# Patient Record
Sex: Female | Born: 1965 | Race: White | Hispanic: No | Smoking: Never smoker
Health system: Southern US, Community
[De-identification: ages and names within clinical notes are randomized; demographics above are authoritative.]

## PROBLEM LIST (undated history)

## (undated) DIAGNOSIS — I10 Essential (primary) hypertension: Secondary | ICD-10-CM

## (undated) DIAGNOSIS — T753XXA Motion sickness, initial encounter: Secondary | ICD-10-CM

## (undated) DIAGNOSIS — F419 Anxiety disorder, unspecified: Secondary | ICD-10-CM

## (undated) DIAGNOSIS — J302 Other seasonal allergic rhinitis: Secondary | ICD-10-CM

## (undated) DIAGNOSIS — G4730 Sleep apnea, unspecified: Secondary | ICD-10-CM

## (undated) DIAGNOSIS — M1990 Unspecified osteoarthritis, unspecified site: Secondary | ICD-10-CM

## (undated) DIAGNOSIS — K219 Gastro-esophageal reflux disease without esophagitis: Secondary | ICD-10-CM

## (undated) HISTORY — PX: GUM SURGERY: SHX658

## (undated) HISTORY — DX: Sleep apnea, unspecified: G47.30

---
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
## 2007-06-13 ENCOUNTER — Ambulatory Visit: Payer: Self-pay | Admitting: Gynecologic Oncology

## 2007-07-11 ENCOUNTER — Ambulatory Visit: Payer: Self-pay | Admitting: Gynecologic Oncology

## 2007-08-24 HISTORY — PX: TUBAL LIGATION: SHX77

## 2007-10-22 ENCOUNTER — Ambulatory Visit: Payer: Self-pay | Admitting: Gynecologic Oncology

## 2007-10-31 ENCOUNTER — Ambulatory Visit: Payer: Self-pay | Admitting: Gynecologic Oncology

## 2008-01-01 ENCOUNTER — Ambulatory Visit: Payer: Self-pay | Admitting: Unknown Physician Specialty

## 2008-01-09 ENCOUNTER — Ambulatory Visit: Payer: Self-pay | Admitting: Unknown Physician Specialty

## 2008-01-09 HISTORY — PX: OTHER SURGICAL HISTORY: SHX169

## 2013-09-19 LAB — HM PAP SMEAR: HM PAP: NEGATIVE

## 2013-09-28 LAB — HM MAMMOGRAPHY

## 2014-10-01 LAB — CBC AND DIFFERENTIAL
HCT: 40 % (ref 36–46)
HEMOGLOBIN: 13.6 g/dL (ref 12.0–16.0)
Platelets: 289 10*3/uL (ref 150–399)
WBC: 6 10*3/mL

## 2014-10-01 LAB — HEPATIC FUNCTION PANEL
ALT: 13 U/L (ref 7–35)
AST: 18 U/L (ref 13–35)

## 2014-10-01 LAB — LIPID PANEL
CHOLESTEROL: 196 mg/dL (ref 0–200)
HDL: 60 mg/dL (ref 35–70)
LDL CALC: 111 mg/dL
Triglycerides: 126 mg/dL (ref 40–160)

## 2014-10-01 LAB — BASIC METABOLIC PANEL
BUN: 19 mg/dL (ref 4–21)
Creatinine: 0.8 mg/dL (ref 0.5–1.1)
Glucose: 88 mg/dL
Potassium: 4.1 mmol/L (ref 3.4–5.3)
SODIUM: 138 mmol/L (ref 137–147)

## 2014-10-01 LAB — TSH: TSH: 1.65 u[IU]/mL (ref 0.41–5.90)

## 2014-12-26 DIAGNOSIS — F32A Depression, unspecified: Secondary | ICD-10-CM | POA: Insufficient documentation

## 2014-12-26 DIAGNOSIS — I10 Essential (primary) hypertension: Secondary | ICD-10-CM | POA: Insufficient documentation

## 2014-12-26 DIAGNOSIS — F329 Major depressive disorder, single episode, unspecified: Secondary | ICD-10-CM | POA: Insufficient documentation

## 2014-12-26 DIAGNOSIS — F4329 Adjustment disorder with other symptoms: Secondary | ICD-10-CM | POA: Insufficient documentation

## 2014-12-26 DIAGNOSIS — K219 Gastro-esophageal reflux disease without esophagitis: Secondary | ICD-10-CM | POA: Insufficient documentation

## 2014-12-26 DIAGNOSIS — F439 Reaction to severe stress, unspecified: Secondary | ICD-10-CM | POA: Insufficient documentation

## 2014-12-26 DIAGNOSIS — J309 Allergic rhinitis, unspecified: Secondary | ICD-10-CM | POA: Insufficient documentation

## 2014-12-26 DIAGNOSIS — R030 Elevated blood-pressure reading, without diagnosis of hypertension: Secondary | ICD-10-CM

## 2014-12-26 DIAGNOSIS — F419 Anxiety disorder, unspecified: Secondary | ICD-10-CM | POA: Insufficient documentation

## 2014-12-26 DIAGNOSIS — E559 Vitamin D deficiency, unspecified: Secondary | ICD-10-CM | POA: Insufficient documentation

## 2014-12-26 DIAGNOSIS — F4320 Adjustment disorder, unspecified: Secondary | ICD-10-CM | POA: Insufficient documentation

## 2014-12-26 DIAGNOSIS — IMO0001 Reserved for inherently not codable concepts without codable children: Secondary | ICD-10-CM | POA: Insufficient documentation

## 2015-01-22 LAB — HM COLONOSCOPY

## 2015-02-17 ENCOUNTER — Encounter: Payer: Self-pay | Admitting: *Deleted

## 2015-02-20 NOTE — Discharge Instructions (Signed)

## 2015-02-21 ENCOUNTER — Ambulatory Visit
Admission: RE | Admit: 2015-02-21 | Discharge: 2015-02-21 | Disposition: A | Discharge status: Home / Self Care | Payer: BC Managed Care – PPO | Source: Ambulatory Visit | Attending: Gastroenterology | Admitting: Gastroenterology

## 2015-02-21 ENCOUNTER — Ambulatory Visit: Payer: BC Managed Care – PPO | Admitting: Anesthesiology

## 2015-02-21 ENCOUNTER — Encounter
Admission: RE | Disposition: A | Discharge status: Home / Self Care | Payer: Self-pay | Source: Ambulatory Visit | Attending: Gastroenterology

## 2015-02-21 DIAGNOSIS — K640 First degree hemorrhoids: Secondary | ICD-10-CM | POA: Diagnosis not present

## 2015-02-21 DIAGNOSIS — Z79899 Other long term (current) drug therapy: Secondary | ICD-10-CM | POA: Diagnosis not present

## 2015-02-21 DIAGNOSIS — J302 Other seasonal allergic rhinitis: Secondary | ICD-10-CM | POA: Diagnosis not present

## 2015-02-21 DIAGNOSIS — Z1211 Encounter for screening for malignant neoplasm of colon: Secondary | ICD-10-CM | POA: Insufficient documentation

## 2015-02-21 DIAGNOSIS — K5730 Diverticulosis of large intestine without perforation or abscess without bleeding: Secondary | ICD-10-CM | POA: Insufficient documentation

## 2015-02-21 DIAGNOSIS — Z7951 Long term (current) use of inhaled steroids: Secondary | ICD-10-CM | POA: Insufficient documentation

## 2015-02-21 DIAGNOSIS — Z9889 Other specified postprocedural states: Secondary | ICD-10-CM | POA: Diagnosis not present

## 2015-02-21 DIAGNOSIS — M1990 Unspecified osteoarthritis, unspecified site: Secondary | ICD-10-CM | POA: Diagnosis not present

## 2015-02-21 DIAGNOSIS — Z9851 Tubal ligation status: Secondary | ICD-10-CM | POA: Insufficient documentation

## 2015-02-21 HISTORY — DX: Motion sickness, initial encounter: T75.3XXA

## 2015-02-21 HISTORY — PX: COLONOSCOPY: SHX5424

## 2015-02-21 HISTORY — DX: Other seasonal allergic rhinitis: J30.2

## 2015-02-21 HISTORY — DX: Unspecified osteoarthritis, unspecified site: M19.90

## 2015-02-21 SURGERY — COLONOSCOPY
Anesthesia: Monitor Anesthesia Care | Wound class: Contaminated

## 2015-02-21 MED ORDER — PROPOFOL 10 MG/ML IV BOLUS
INTRAVENOUS | Status: DC | PRN
  Administered 2015-02-21: 100 mg via INTRAVENOUS
  Administered 2015-02-21 (×2): 50 mg via INTRAVENOUS

## 2015-02-21 MED ORDER — STERILE WATER FOR IRRIGATION IR SOLN
Status: DC | PRN
  Administered 2015-02-21: 12:00:00

## 2015-02-21 MED ORDER — LIDOCAINE HCL (CARDIAC) 20 MG/ML IV SOLN
INTRAVENOUS | Status: DC | PRN
  Administered 2015-02-21: 40 mg via INTRAVENOUS

## 2015-02-21 MED ORDER — LACTATED RINGERS IV SOLN
INTRAVENOUS | Status: DC
  Administered 2015-02-21: 11:00:00 via INTRAVENOUS

## 2015-02-21 MED ORDER — ACETAMINOPHEN 325 MG PO TABS: 325.0000 mg | ORAL_TABLET | ORAL | Status: DC | PRN

## 2015-02-21 MED ORDER — ACETAMINOPHEN 160 MG/5ML PO SOLN: 325.0000 mg | ORAL | Status: DC | PRN

## 2015-02-21 SURGICAL SUPPLY — 28 items

## 2015-02-21 NOTE — Transfer of Care (Signed)
Immediate Anesthesia Transfer of Care Note  Patient: Amanda Day  Procedure(s) Performed: Procedure(s): COLONOSCOPY (N/A)  Patient Location: PACU  Anesthesia Type: MAC  Level of Consciousness: awake, alert  and patient cooperative  Airway and Oxygen Therapy: Patient Spontanous Breathing and Patient connected to supplemental oxygen  Post-op Assessment: Post-op Vital signs reviewed, Patient's Cardiovascular Status Stable, Respiratory Function Stable, Patent Airway and No signs of Nausea or vomiting  Post-op Vital Signs: Reviewed and stable  Complications: No apparent anesthesia complications

## 2015-02-21 NOTE — H&P (Signed)
  [facility]  [address], [place], [place] [address] Phone: [phone] Fax : [phone]  Primary Care Physician:  No primary care provider on file. Primary Gastroenterologist:  Dr. Allen Norris  Pre-Procedure History & Physical: HPI:  Amanda Day is a 49 y.o. female is here for a screening colonoscopy.   Past Medical History  Diagnosis Date  . Seasonal allergies   . Arthritis     right great toe  . Motion sickness     long car rides, amusement park rides    Past Surgical History  Procedure Laterality Date  . Tubal ligation  2009  . 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 Authorizing Provider  Ergocalciferol (VITAMIN D2) 2000 UNITS TABS Take by mouth daily. PM   Yes Historical Provider, MD  fluticasone (FLONASE) 50 MCG/ACT nasal spray Place into the nose daily as needed. AM 01/01/14  Yes Historical Provider, MD  loratadine (CLARITIN) 10 MG tablet Take by mouth daily as needed. AM   Yes Historical Provider, MD  metoprolol succinate (TOPROL-XL) 50 MG 24 hr tablet Take by mouth daily. AM 11/19/14  Yes Historical Provider, MD  sertraline (ZOLOFT) 25 MG tablet Take by mouth daily. AM 04/16/14  Yes Historical Provider, MD    Allergies as of 12/02/2014  . (Not on File)    Family History  Problem Relation Age of Onset  . Hypertension Mother   . Anxiety disorder Mother   . Mitral valve prolapse Mother   . Cancer Father     Lung and Kidney  . Hypertension Father   . Arrhythmia Father     History   Social History  . Marital Status: Married    Spouse Name: N/A  . Number of Children: N/A  . Years of Education: N/A   Occupational History  . Not on file.   Social History Main Topics  . Smoking status: Never Smoker   . Smokeless tobacco: Not on file  . Alcohol Use: Yes     Comment: 4 nights a week, wine or beer  . Drug Use: No    . Sexual Activity: Not on file   Other Topics Concern  . Not on file   Social History Narrative    Review of Systems: See HPI, otherwise negative ROS  Physical Exam: BP 135/79 mmHg  Pulse 67  Temp(Src) 98.1 F (36.7 C)  Resp 16  Ht 5\' 8"  (1.727 m)  Wt 198 lb (89.812 kg)  BMI 30.11 kg/m2  SpO2 97%  LMP 02/15/2015 (Exact Date) General:   Alert,  pleasant and cooperative in NAD Head:  Normocephalic and atraumatic. Neck:  Supple; no masses or thyromegaly. Lungs:  Clear throughout to auscultation.    Heart:  Regular rate and rhythm. Abdomen:  Soft, nontender and nondistended. Normal bowel sounds, without guarding, and without rebound.   Neurologic:  Alert and  oriented x4;  grossly normal neurologically.  Impression/Plan: Amanda Day is now here to undergo a screening colonoscopy.  Risks, benefits, and alternatives regarding colonoscopy have been reviewed with the patient.  Questions have been answered.  All parties agreeable.

## 2015-02-21 NOTE — Anesthesia Preprocedure Evaluation (Signed)
Anesthesia Evaluation  Patient identified by MRN, date of birth, ID band  Reviewed: Allergy & Precautions, H&P , NPO status , Patient's Chart, lab work & pertinent test results  Airway Mallampati: I  TM Distance: >3 FB Neck ROM: full    Dental no notable dental hx.    Pulmonary    Pulmonary exam normal       Cardiovascular hypertension, Rhythm:regular Rate:Normal     Neuro/Psych PSYCHIATRIC DISORDERS    GI/Hepatic GERD-  ,  Endo/Other    Renal/GU      Musculoskeletal   Abdominal   Peds  Hematology   Anesthesia Other Findings   Reproductive/Obstetrics                             Anesthesia Physical Anesthesia Plan  ASA: II  Anesthesia Plan: MAC   Post-op Pain Management:    Induction:   Airway Management Planned:   Additional Equipment:   Intra-op Plan:   Post-operative Plan:   Informed Consent: I have reviewed the patients History and Physical, chart, labs and discussed the procedure including the risks, benefits and alternatives for the proposed anesthesia with the patient or authorized representative who has indicated his/her understanding and acceptance.     Plan Discussed with: CRNA  Anesthesia Plan Comments:         Anesthesia Quick Evaluation

## 2015-02-21 NOTE — Anesthesia Procedure Notes (Signed)
Procedure Name: MAC Performed by: Latysha Thackston Pre-anesthesia Checklist: Patient identified, Emergency Drugs available, Suction available, Timeout performed and Patient being monitored Patient Re-evaluated:Patient Re-evaluated prior to inductionOxygen Delivery Method: Nasal cannula Placement Confirmation: positive ETCO2     

## 2015-02-21 NOTE — Op Note (Signed)
[facility] Patient Name: Amanda Day Procedure Date: 02/21/2015 11:26 AM MRN: [id number] Account #: [id number] Date of Birth: 1966/07/28 Admit Type: Outpatient Age: 49 Room: [facility] OR ROOM 01 Gender: Female Note Status: Finalized Procedure:         Colonoscopy Indications:       Screening for colorectal malignant neoplasm Providers:         Lucilla Lame, MD Referring MD:      Jerrell Belfast, MD (Referring MD) Medicines:         Propofol per Anesthesia Complications:     No immediate complications. Procedure:         Pre-Anesthesia Assessment:                    - Prior to the procedure, a History and Physical was                     performed, and patient medications and allergies were                     reviewed. The patient's tolerance of previous anesthesia                     was also reviewed. The risks and benefits of the procedure                     and the sedation options and risks were discussed with the                     patient. All questions were answered, and informed consent                     was obtained. Prior Anticoagulants: The patient has taken                     no previous anticoagulant or antiplatelet agents. ASA                     Grade Assessment: II - A patient with mild systemic                     disease. After reviewing the risks and benefits, the                     patient was deemed in satisfactory condition to undergo                     the procedure.                    After obtaining informed consent, the colonoscope was                     passed under direct vision. Throughout the procedure, the                     patient's blood pressure, pulse, and oxygen saturations                     were monitored continuously. The Olympus CF H180AL                     colonoscope (S#: [id number]) was introduced through the anus  and advanced to the the cecum, identified by appendiceal                      orifice and ileocecal valve. The colonoscopy was performed                     without difficulty. The patient tolerated the procedure                     well. The quality of the bowel preparation was excellent. Findings:      The perianal and digital rectal examinations were normal.      A few small-mouthed diverticula were found in the sigmoid colon.      Non-bleeding internal hemorrhoids were found during retroflexion. The       hemorrhoids were Grade I (internal hemorrhoids that do not prolapse). Impression:        - Diverticulosis in the sigmoid colon.                    - Non-bleeding internal hemorrhoids.                    - No specimens collected. Recommendation:    - Repeat colonoscopy in 10 years for screening unless any                     change in family history or lower GI problems. Procedure Code(s): --- Professional ---                    [id number], Colonoscopy, flexible; diagnostic, including                     collection of specimen(s) by brushing or washing, when                     performed (separate procedure) Diagnosis Code(s): --- Professional ---                    Z12.11, Encounter for screening for malignant neoplasm of                     colon CPT copyright 2014 [employer]. All rights reserved. The codes documented in this report are preliminary and upon coder review may  be revised to meet current compliance requirements. Lucilla Lame, MD 02/21/2015 12:03:00 PM This report has been signed electronically. Number of Addenda: 0 Note Initiated On: 02/21/2015 11:26 AM Scope Withdrawal Time: 0 hours 6 minutes 6 seconds  Total Procedure Duration: 0 hours 10 minutes 58 seconds       [facility]

## 2015-02-21 NOTE — Anesthesia Postprocedure Evaluation (Signed)
  Anesthesia Post-op Note  Patient: Amanda Day  Procedure(s) Performed: Procedure(s): COLONOSCOPY (N/A)  Anesthesia type:MAC  Patient location: PACU  Post pain: Pain level controlled  Post assessment: Post-op Vital signs reviewed, Patient's Cardiovascular Status Stable, Respiratory Function Stable, Patent Airway and No signs of Nausea or vomiting  Post vital signs: Reviewed and stable  Last Vitals:  Filed Vitals:   02/21/15 1200  BP: 115/67  Pulse: 70  Temp:   Resp: 20    Level of consciousness: awake, alert  and patient cooperative  Complications: No apparent anesthesia complications

## 2015-02-25 ENCOUNTER — Encounter: Payer: Self-pay | Admitting: Gastroenterology

## 2015-02-26 DIAGNOSIS — Z1211 Encounter for screening for malignant neoplasm of colon: Secondary | ICD-10-CM | POA: Insufficient documentation

## 2015-03-21 ENCOUNTER — Encounter: Payer: Self-pay | Admitting: Family Medicine

## 2015-03-21 ENCOUNTER — Ambulatory Visit (INDEPENDENT_AMBULATORY_CARE_PROVIDER_SITE_OTHER): Payer: BC Managed Care – PPO | Admitting: Family Medicine

## 2015-03-21 VITALS — BP 122/76 | HR 88 | Temp 99.0°F | Resp 16 | Ht 68.0 in | Wt 203.0 lb

## 2015-03-21 DIAGNOSIS — F329 Major depressive disorder, single episode, unspecified: Secondary | ICD-10-CM

## 2015-03-21 DIAGNOSIS — S8392XA Sprain of unspecified site of left knee, initial encounter: Secondary | ICD-10-CM

## 2015-03-21 DIAGNOSIS — F32A Depression, unspecified: Secondary | ICD-10-CM

## 2015-03-21 DIAGNOSIS — S8390XA Sprain of unspecified site of unspecified knee, initial encounter: Secondary | ICD-10-CM | POA: Insufficient documentation

## 2015-03-21 MED ORDER — SERTRALINE HCL 25 MG PO TABS: ORAL_TABLET | ORAL | Status: DC

## 2015-03-21 NOTE — Progress Notes (Signed)
Subjective:    Patient ID: Amanda Day, female    DOB: 1966/04/12, 49 y.o.   MRN: [id number]  Hypertension This is a chronic problem. The problem is controlled. Associated symptoms include anxiety. Pertinent negatives include no blurred vision, chest pain, headaches, malaise/fatigue, neck pain, orthopnea, palpitations, peripheral edema, shortness of breath or sweats. Treatments tried: Metoprolol 50 mg. There are no compliance problems.   Anxiety Presents for follow-up visit. The problem has been gradually improving. Symptoms include muscle tension. Patient reports no chest pain, compulsions, confusion, decreased concentration, depressed mood, dizziness, dry mouth, excessive worry, feeling of choking, hyperventilation, insomnia, irritability, malaise, nausea, nervous/anxious behavior, obsessions, palpitations, panic, restlessness, shortness of breath or suicidal ideas. The quality of sleep is good. Nighttime awakenings: none.   Past treatments include SSRIs (Zoloft 25 mg). Compliance with prior treatments has been good.  Knee Pain  Incident onset: about 1 month ago. The incident occurred at home (pt fell off of a ladder (about 3 feet off the ground)). The injury mechanism was a direct blow. The pain is present in the left knee. Quality: "tightness" The pain is at a severity of 2/10. The pain is mild. The pain has been intermittent since onset. Pertinent negatives include no inability to bear weight, loss of motion, loss of sensation, muscle weakness, numbness or tingling. The symptoms are aggravated by movement. She has tried ice, heat and NSAIDs for the symptoms. The treatment provided moderate relief.      Review of Systems  Constitutional: Negative for malaise/fatigue and irritability.  Eyes: Negative for blurred vision.  Respiratory: Negative for shortness of breath.   Cardiovascular: Negative for chest pain, palpitations and orthopnea.  Gastrointestinal: Negative for nausea.   Musculoskeletal: Negative for neck pain.  Neurological: Negative for dizziness, tingling, numbness and headaches.  Psychiatric/Behavioral: Negative for suicidal ideas, confusion and decreased concentration. The patient is not nervous/anxious and does not have insomnia.    BP 122/76 mmHg  Pulse 88  Temp(Src) 99 F (37.2 C) (Oral)  Resp 16  Ht 5\' 8"  (1.727 m)  Wt 203 lb (92.08 kg)  BMI 30.87 kg/m2  LMP 03/12/2015   Patient Active Problem List   Diagnosis Date Noted  . Special screening for malignant neoplasms, colon   . Adaptation reaction 12/26/2014  . Anxiety 12/26/2014  . Allergic rhinitis 12/26/2014  . Clinical depression 12/26/2014  . Blood pressure elevated 12/26/2014  . Essential (primary) hypertension 12/26/2014  . Acid reflux 12/26/2014  . Feeling stressed out 12/26/2014  . Stress and adjustment reaction 12/26/2014  . Avitaminosis D 12/26/2014   Past Medical History  Diagnosis Date  . Seasonal allergies   . Arthritis     right great toe  . Motion sickness     long car rides, amusement park rides   Current Outpatient Prescriptions on File Prior to Visit  Medication Sig  . Ergocalciferol (VITAMIN D2) 2000 UNITS TABS Take by mouth daily. PM  . fluticasone (FLONASE) 50 MCG/ACT nasal spray Place into the nose daily as needed. AM  . loratadine (CLARITIN) 10 MG tablet Take by mouth daily as needed. AM  . metoprolol succinate (TOPROL-XL) 50 MG 24 hr tablet Take by mouth daily. AM  . sertraline (ZOLOFT) 25 MG tablet Take by mouth daily. AM   No current facility-administered medications on file prior to visit.   No Known Allergies Past Surgical History  Procedure Laterality Date  . Tubal ligation  2009  . Other surgical history  2009    pre  cancerous cells removed due to HPV  . Colonoscopy N/A 02/21/2015    Procedure: COLONOSCOPY;  Surgeon: Lucilla Lame, MD;  Location: [facility];  Service: Gastroenterology;  Laterality: N/A;   History   Social History   . Marital Status: Married    Spouse Name: N/A  . Number of Children: N/A  . Years of Education: N/A   Occupational History  . Not on file.   Social History Main Topics  . Smoking status: Never Smoker   . Smokeless tobacco: Never Used  . Alcohol Use: Yes     Comment: 4 nights a week, wine or beer  . Drug Use: No  . Sexual Activity: Not on file   Other Topics Concern  . Not on file   Social History Narrative   Family History  Problem Relation Age of Onset  . Hypertension Mother   . Anxiety disorder Mother   . Mitral valve prolapse Mother   . Cancer Father     Lung and Kidney  . Hypertension Father   . Arrhythmia Father        Objective:   Physical Exam  Constitutional: She is oriented to person, place, and time. She appears well-developed and well-nourished.  Musculoskeletal: Normal range of motion. She exhibits tenderness (left  lateral  ligament to palpation. Joint stablle. No edema or erythema noted. ).  Neurological: She is alert and oriented to person, place, and time.  Psychiatric: She has a normal mood and affect. Her behavior is normal. Judgment and thought content normal.   BP 122/76 mmHg  Pulse 88  Temp(Src) 99 F (37.2 C) (Oral)  Resp 16  Ht 5\' 8"  (1.727 m)  Wt 203 lb (92.08 kg)  BMI 30.87 kg/m2  LMP 03/12/2015      Assessment & Plan:  1. Knee sprain, left, initial encounter Gave hand out on stretching and strengthening.  Ice if flares. Call if worsens or does not improve and will refer to Orthopedics.   2. Clinical depression Stable. Continue current medication.   Margarita Rana, MD

## 2015-03-21 NOTE — Patient Instructions (Addendum)
Knee Exercises EXERCISES RANGE OF MOTION (ROM) AND STRETCHING EXERCISES These exercises may help you when beginning to rehabilitate your injury. Your symptoms may resolve with or without further involvement from your physician, physical therapist, or athletic trainer. While completing these exercises, remember:   Restoring tissue flexibility helps normal motion to return to the joints. This allows healthier, less painful movement and activity.  An effective stretch should be held for at least 30 seconds.  A stretch should never be painful. You should only feel a gentle lengthening or release in the stretched tissue. STRETCH - Knee Extension, Prone  Lie on your stomach on a firm surface, such as a bed or countertop. Place your right / left knee and leg just beyond the edge of the surface. You may wish to place a towel under the far end of your right / left thigh for comfort.  Relax your leg muscles and allow gravity to straighten your knee. Your clinician may advise you to add an ankle weight if more resistance is helpful for you.  You should feel a stretch in the back of your right / left knee. Hold this position for __________ seconds. Repeat __________ times. Complete this stretch __________ times per day. * Your physician, physical therapist, or athletic trainer may ask you to add ankle weight to enhance your stretch.  RANGE OF MOTION - Knee Flexion, Active  Lie on your back with both knees straight. (If this causes back discomfort, bend your opposite knee, placing your foot flat on the floor.)  Slowly slide your heel back toward your buttocks until you feel a gentle stretch in the front of your knee or thigh.  Hold for __________ seconds. Slowly slide your heel back to the starting position. Repeat __________ times. Complete this exercise __________ times per day.  STRETCH - Quadriceps, Prone   Lie on your stomach on a firm surface, such as a bed or padded floor.  Bend your right /  left knee and grasp your ankle. If you are unable to reach your ankle or pant leg, use a belt around your foot to lengthen your reach.  Gently pull your heel toward your buttocks. Your knee should not slide out to the side. You should feel a stretch in the front of your thigh and/or knee.  Hold this position for __________ seconds. Repeat __________ times. Complete this stretch __________ times per day.  STRETCH - Hamstrings, Supine   Lie on your back. Loop a belt or towel over the ball of your right / left foot.  Straighten your right / left knee and slowly pull on the belt to raise your leg. Do not allow the right / left knee to bend. Keep your opposite leg flat on the floor.  Raise the leg until you feel a gentle stretch behind your right / left knee or thigh. Hold this position for __________ seconds. Repeat __________ times. Complete this stretch __________ times per day.  STRENGTHENING EXERCISES These exercises may help you when beginning to rehabilitate your injury. They may resolve your symptoms with or without further involvement from your physician, physical therapist, or athletic trainer. While completing these exercises, remember:   Muscles can gain both the endurance and the strength needed for everyday activities through controlled exercises.  Complete these exercises as instructed by your physician, physical therapist, or athletic trainer. Progress the resistance and repetitions only as guided.  You may experience muscle soreness or fatigue, but the pain or discomfort you are trying to eliminate   should never worsen during these exercises. If this pain does worsen, stop and make certain you are following the directions exactly. If the pain is still present after adjustments, discontinue the exercise until you can discuss the trouble with your clinician. STRENGTH - Quadriceps, Isometrics  Lie on your back with your right / left leg extended and your opposite knee  bent.  Gradually tense the muscles in the front of your right / left thigh. You should see either your knee cap slide up toward your hip or increased dimpling just above the knee. This motion will push the back of the knee down toward the floor/mat/bed on which you are lying.  Hold the muscle as tight as you can without increasing your pain for __________ seconds.  Relax the muscles slowly and completely in between each repetition. Repeat __________ times. Complete this exercise __________ times per day.  STRENGTH - Quadriceps, Short Arcs   Lie on your back. Place a __________ inch towel roll under your knee so that the knee slightly bends.  Raise only your lower leg by tightening the muscles in the front of your thigh. Do not allow your thigh to rise.  Hold this position for __________ seconds. Repeat __________ times. Complete this exercise __________ times per day.  OPTIONAL ANKLE WEIGHTS: Begin with ____________________, but DO NOT exceed ____________________. Increase in 1 pound/0.5 kilogram increments.  STRENGTH - Quadriceps, Straight Leg Raises  Quality counts! Watch for signs that the quadriceps muscle is working to insure you are strengthening the correct muscles and not "cheating" by substituting with healthier muscles.  Lay on your back with your right / left leg extended and your opposite knee bent.  Tense the muscles in the front of your right / left thigh. You should see either your knee cap slide up or increased dimpling just above the knee. Your thigh may even quiver.  Tighten these muscles even more and raise your leg 4 to 6 inches off the floor. Hold for __________ seconds.  Keeping these muscles tense, lower your leg.  Relax the muscles slowly and completely in between each repetition. Repeat __________ times. Complete this exercise __________ times per day.  STRENGTH - Hamstring, Curls  Lay on your stomach with your legs extended. (If you lay on a bed, your feet  may hang over the edge.)  Tighten the muscles in the back of your thigh to bend your right / left knee up to 90 degrees. Keep your hips flat on the bed/floor.  Hold this position for __________ seconds.  Slowly lower your leg back to the starting position. Repeat __________ times. Complete this exercise __________ times per day.  OPTIONAL ANKLE WEIGHTS: Begin with ____________________, but DO NOT exceed ____________________. Increase in 1 pound/0.5 kilogram increments.  STRENGTH - Quadriceps, Squats  Stand in a door frame so that your feet and knees are in line with the frame.  Use your hands for balance, not support, on the frame.  Slowly lower your weight, bending at the hips and knees. Keep your lower legs upright so that they are parallel with the door frame. Squat only within the range that does not increase your knee pain. Never let your hips drop below your knees.  Slowly return upright, pushing with your legs, not pulling with your hands. Repeat __________ times. Complete this exercise __________ times per day.  STRENGTH - Quadriceps, Wall Slides  Follow guidelines for form closely. Increased knee pain often results from poorly placed feet or knees.  Lean   against a smooth wall or door and walk your feet out 18-24 inches. Place your feet hip-width apart.  Slowly slide down the wall or door until your knees bend __________ degrees.* Keep your knees over your heels, not your toes, and in line with your hips, not falling to either side.  Hold for __________ seconds. Stand up to rest for __________ seconds in between each repetition. Repeat __________ times. Complete this exercise __________ times per day. * Your physician, physical therapist, or athletic trainer will alter this angle based on your symptoms and progress. Document Released: 06/23/2005 Document Revised: 12/24/2013 Document Reviewed: 11/21/2008 [facility] Patient Information 2015 [place], [place]. This information is not  intended to replace advice given to you by your health care provider. Make sure you discuss any questions you have with your health care provider. Knee Sprain A knee sprain is a tear in one of the strong, fibrous tissues that connect the bones (ligaments) in your knee. The severity of the sprain depends on how much of the ligament is torn. The tear can be either partial or complete. CAUSES  Often, sprains are a result of a fall or injury. The force of the impact causes the fibers of your ligament to stretch too much. This excess tension causes the fibers of your ligament to tear. SIGNS AND SYMPTOMS  You may have some loss of motion in your knee. Other symptoms include: Bruising. Pain in the knee area. Tenderness of the knee to the touch. Swelling. DIAGNOSIS  To diagnose a knee sprain, your health care provider will physically examine your knee. Your health care provider may also suggest an X-ray exam of your knee to make sure no bones are broken. TREATMENT  If your ligament is only partially torn, treatment usually involves keeping the knee in a fixed position (immobilization) or bracing your knee for activities that require movement for several weeks. To do this, your health care provider will apply a bandage, cast, or splint to keep your knee from moving and to support your knee during movement until it heals. For a partially torn ligament, the healing process usually takes 4-6 weeks. If your ligament is completely torn, depending on which ligament it is, you may need surgery to reconnect the ligament to the bone or reconstruct it. After surgery, a cast or splint may be applied and will need to stay on your knee for 4-6 weeks while your ligament heals. HOME CARE INSTRUCTIONS Keep your injured knee elevated to decrease swelling. To ease pain and swelling, apply ice to the injured area: Put ice in a plastic bag. Place a towel between your skin and the bag. Leave the ice on for 20 minutes, 2-3  times a day. Only take medicine for pain as directed by your health care provider. Do not leave your knee unprotected until pain and stiffness go away (usually 4-6 weeks). If you have a cast or splint, do not allow it to get wet. If you have been instructed not to remove it, cover it with a plastic bag when you shower or bathe. Do not swim. Your health care provider may suggest exercises for you to do during your recovery to prevent or limit permanent weakness and stiffness. SEEK IMMEDIATE MEDICAL CARE IF: Your cast or splint becomes damaged. Your pain becomes worse. You have significant pain, swelling, or numbness below the cast or splint. MAKE SURE YOU: Understand these instructions. Will watch your condition. Will get help right away if you are not doing well or get  worse. Document Released: 08/09/2005 Document Revised: 05/30/2013 Document Reviewed: 03/21/2013 [facility] Patient Information 2015 [place], [place]. This information is not intended to replace advice given to you by your health care provider. Make sure you discuss any questions you have with your health care provider.

## 2015-04-18 ENCOUNTER — Other Ambulatory Visit: Payer: Self-pay | Admitting: Family Medicine

## 2015-04-18 DIAGNOSIS — J309 Allergic rhinitis, unspecified: Secondary | ICD-10-CM

## 2015-06-24 ENCOUNTER — Other Ambulatory Visit: Payer: Self-pay | Admitting: Family Medicine

## 2015-06-24 DIAGNOSIS — I10 Essential (primary) hypertension: Secondary | ICD-10-CM

## 2015-09-12 ENCOUNTER — Encounter: Payer: Self-pay | Admitting: Family Medicine

## 2015-09-12 ENCOUNTER — Ambulatory Visit (INDEPENDENT_AMBULATORY_CARE_PROVIDER_SITE_OTHER): Payer: BC Managed Care – PPO | Admitting: Family Medicine

## 2015-09-12 VITALS — BP 120/80 | HR 80 | Temp 97.9°F | Resp 16 | Wt 201.0 lb

## 2015-09-12 DIAGNOSIS — I10 Essential (primary) hypertension: Secondary | ICD-10-CM | POA: Diagnosis not present

## 2015-09-12 DIAGNOSIS — F419 Anxiety disorder, unspecified: Secondary | ICD-10-CM | POA: Diagnosis not present

## 2015-09-12 DIAGNOSIS — F329 Major depressive disorder, single episode, unspecified: Secondary | ICD-10-CM

## 2015-09-12 DIAGNOSIS — F32A Depression, unspecified: Secondary | ICD-10-CM

## 2015-09-12 NOTE — Progress Notes (Signed)
Subjective:    Patient ID: Amanda Day, female    DOB: Apr 19, 1966, 50 y.o.   MRN: [id number]  Hypertension This is a chronic problem. The problem is controlled. 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 Pertinent negatives include no blurred vision, chest pain, headaches, malaise/fatigue, orthopnea, palpitations, peripheral edema or shortness of breath. Risk factors for coronary artery disease include stress and post-menopausal state. Past treatments include beta blockers (Metoprolol 50 mg po qd). The current treatment provides moderate improvement. There are no compliance problems.   Depression        This is a chronic problem.  Associated symptoms include no decreased concentration, no fatigue, no helplessness, no hopelessness, does not have insomnia, not irritable, no restlessness, no decreased interest, no appetite change, no body aches, no headaches, no indigestion, not sad and no suicidal ideas.  Past treatments include SSRIs - Selective serotonin reuptake inhibitors (Zoloft 25 mg 1-2 tabs po qd. Pt reports she is taking 50 mg po qd due to stress).  Compliance with treatment is good.  Previous treatment provided moderate relief.  Past medical history includes anxiety (controlled with Zoloft).       Review of Systems  Constitutional: Negative for malaise/fatigue, appetite change and fatigue.  Eyes: Negative for blurred vision.  Respiratory: Negative for shortness of breath.   Cardiovascular: Negative for chest pain, palpitations and orthopnea.  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  Neurological: Negative for headaches.  Psychiatric/Behavioral: Positive for depression. Negative for suicidal ideas and decreased concentration. The patient does not have insomnia.    BP 120/80 mmHg  Pulse 80  Temp(Src) 97.9 F (36.6 C)  (Oral)  Resp 16  Wt 201 lb (91.173 kg)  LMP 06/12/2015   Patient Active Problem List   Diagnosis Date Noted  . Knee sprain 03/21/2015  . Special screening for malignant neoplasms, colon   . Adaptation reaction 12/26/2014  . Anxiety 12/26/2014  . Allergic rhinitis 12/26/2014  . Clinical depression 12/26/2014  . Blood pressure elevated 12/26/2014  . Essential (primary) hypertension 12/26/2014  . Acid reflux 12/26/2014  . Feeling stressed out 12/26/2014  . Stress and adjustment reaction 12/26/2014  . Avitaminosis D 12/26/2014   Past Medical History  Diagnosis Date  . Seasonal allergies   . Arthritis     right great toe  . Motion sickness     long car rides, amusement park rides   Current Outpatient Prescriptions on File Prior to Visit  Medication Sig  . Ergocalciferol (VITAMIN D2) 2000 UNITS TABS Take by mouth daily. PM  . fluticasone (FLONASE) 50 MCG/ACT nasal spray Place into the nose daily as needed. AM  . loratadine (CLARITIN) 10 MG tablet Take by mouth daily as needed. AM  . metoprolol succinate (TOPROL-XL) 50 MG 24 hr tablet TAKE 1 TABLET BY MOUTH DAILY  . sertraline (ZOLOFT) 25 MG tablet One to two tabs daily   No current facility-administered medications on file prior to visit.   No Known Allergies Past Surgical History  Procedure Laterality Date  . Tubal ligation  2009  . 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 Colonoscopy N/A 02/21/2015    Procedure: COLONOSCOPY;  Surgeon: Lucilla Lame, MD;  Location: [facility];  Service: Gastroenterology;  Laterality: N/A;   Social History   Social History  . Marital Status: Married  Spouse Name: N/A  . Number of Children: N/A  . Years of Education: N/A   Occupational History  . Not on file.   Social History Main Topics  . Smoking status: Never Smoker   . Smokeless tobacco: Never Used  . Alcohol Use: Yes     Comment: 4 nights a week, wine or beer  . Drug Use: No  .  Sexual Activity: Not on file   Other Topics Concern  . Not on file   Social History Narrative   Family History  Problem Relation Age of Onset  . Hypertension Mother   . Anxiety disorder Mother   . Mitral valve prolapse Mother   . Cancer Father     Lung and Kidney  . Hypertension Father   . Arrhythmia Father       Objective:   Physical Exam  Constitutional: She is oriented to person, place, and time. She appears well-developed and well-nourished. She is not irritable.  Cardiovascular: Normal rate and regular rhythm.   Pulmonary/Chest: Effort normal and breath sounds normal.  Neurological: She is alert and oriented to person, place, and time.  Psychiatric: She has a normal mood and affect. Her behavior is normal. Judgment and thought content normal.   BP 120/80 mmHg  Pulse 80  Temp(Src) 97.9 F (36.6 C) (Oral)  Resp 16  Wt 201 lb (91.173 kg)  LMP 06/12/2015     Assessment & Plan:  1. Essential (primary) hypertension Condition is stable. Please continue current medication and  plan of care as noted.    2. Anxiety Condition is stable. Please continue current medication and  plan of care as noted.    3. Clinical depression Condition is stable. Please continue current medication and  plan of care as noted.    Patient was seen and examined by Jerrell Belfast, MD, and note scribed by Renaldo Fiddler, CMA. I have reviewed the document for accuracy and completeness and I agree with above. Jerrell Belfast, MD   Margarita Rana, MD

## 2015-09-15 ENCOUNTER — Ambulatory Visit: Payer: BC Managed Care – PPO | Admitting: Family Medicine

## 2015-10-29 ENCOUNTER — Encounter: Payer: Self-pay | Admitting: Family Medicine

## 2015-10-29 ENCOUNTER — Ambulatory Visit (INDEPENDENT_AMBULATORY_CARE_PROVIDER_SITE_OTHER): Payer: BC Managed Care – PPO | Admitting: Family Medicine

## 2015-10-29 VITALS — BP 126/78 | HR 90 | Temp 98.0°F | Resp 16 | Ht 68.0 in | Wt 198.0 lb

## 2015-10-29 DIAGNOSIS — F4329 Adjustment disorder with other symptoms: Secondary | ICD-10-CM

## 2015-10-29 DIAGNOSIS — R109 Unspecified abdominal pain: Secondary | ICD-10-CM | POA: Diagnosis not present

## 2015-10-29 DIAGNOSIS — I10 Essential (primary) hypertension: Secondary | ICD-10-CM

## 2015-10-29 DIAGNOSIS — J309 Allergic rhinitis, unspecified: Secondary | ICD-10-CM | POA: Diagnosis not present

## 2015-10-29 DIAGNOSIS — Z0000 Encounter for general adult medical examination without abnormal findings: Secondary | ICD-10-CM

## 2015-10-29 DIAGNOSIS — Z1239 Encounter for other screening for malignant neoplasm of breast: Secondary | ICD-10-CM | POA: Diagnosis not present

## 2015-10-29 DIAGNOSIS — R10A0 Flank pain, unspecified side: Secondary | ICD-10-CM

## 2015-10-29 LAB — POCT URINALYSIS DIPSTICK
BILIRUBIN UA: NEGATIVE
GLUCOSE UA: NEGATIVE
Ketones, UA: NEGATIVE
NITRITE UA: NEGATIVE
PH UA: 7.5
Protein, UA: NEGATIVE
Spec Grav, UA: 1.01
Urobilinogen, UA: 0.2

## 2015-10-29 MED ORDER — ALPRAZOLAM 0.5 MG PO TABS: 0.5000 mg | ORAL_TABLET | Freq: Every evening | ORAL | Status: DC | PRN

## 2015-10-29 MED ORDER — MONTELUKAST SODIUM 10 MG PO TABS: 10.0000 mg | ORAL_TABLET | Freq: Every day | ORAL | Status: DC

## 2015-10-29 NOTE — Progress Notes (Signed)
Patient ID: [name], female   DOB: June 18, 1966, 50 y.o.   MRN: [id number]       Patient: Amanda Day, Female    DOB: 05-Jan-1966, 50 y.o.   MRN: [id number] Visit Date: 10/29/2015  Today's Provider: Margarita Rana, MD   Chief Complaint  Patient presents with  . Annual Exam   Subjective:    Annual physical exam Amanda Day is a 50 y.o. female who presents today for health maintenance and complete physical. She feels well. She reports exercising 5-7 days a week. She reports she is sleeping well. 09/20/14 CPE 09/19/13 Pap-neg; HPV-neg 09/28/13 Mammogram-BI-RADS 1 ([facility]) 02/21/15 Colonoscopy-diverticulosis, int hemorrhoids, recheck in 10 yrs Dr. Allen Norris  -----------------------------------------------------------------  Flank pain: Patient reports she has been having flank pain for about 2 weeks. Patient denies burning or painful urination. Patient reports history of kidney infection in the past with out UTI symptoms. Patient reports taking Ibuprofen for pain and reports mild relief. Patient reports drinking plenty of fluids.  Also having some increased congestion.  Is taking her allergy medication, not helping.    Also did find out ex-husband getting remarried to one of his undergraduate students. Found out via FB. Did take Xanax for a few nights after that.   Doing ok now.  Would like refill to have just in case.    Review of Systems  Constitutional: Negative.   HENT: Positive for congestion.   Eyes: Negative.   Respiratory: Negative.   Cardiovascular: Negative.   Gastrointestinal: Negative.   Endocrine: Negative.   Genitourinary: Positive for flank pain.  Musculoskeletal: Negative.   Skin: Negative.   Allergic/Immunologic: Positive for environmental allergies.  Neurological: Positive for light-headedness.  Hematological: Negative.   Psychiatric/Behavioral: Negative.     Social History      She  reports that she has never smoked. She has never  used smokeless tobacco. She reports that she drinks alcohol. She reports that she does not use illicit drugs.       Social History   Social History  . Marital Status: Divorced    Spouse Name: N/A  . Number of Children: N/A  . Years of Education: N/A   Social History Main Topics  . Smoking status: Never Smoker   . Smokeless tobacco: Never Used  . Alcohol Use: Yes     Comment: 4 nights a week, wine or beer  . Drug Use: No  . Sexual Activity: Not Asked   Other Topics Concern  . None   Social History Narrative    Past Medical History  Diagnosis Date  . Seasonal allergies   . Arthritis     right great toe  . Motion sickness     long car rides, amusement park rides     Patient Active Problem List   Diagnosis Date Noted  . Knee sprain 03/21/2015  . Special screening for malignant neoplasms, colon   . Adaptation reaction 12/26/2014  . Allergic rhinitis 12/26/2014  . Blood pressure elevated 12/26/2014  . Essential (primary) hypertension 12/26/2014  . Acid reflux 12/26/2014  . Avitaminosis D 12/26/2014    Past Surgical History  Procedure Laterality Date  . Tubal ligation  2009  . 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 Colonoscopy N/A 02/21/2015    Procedure: COLONOSCOPY;  Surgeon: Lucilla Lame, MD;  Location: [facility];  Service: Gastroenterology;  Laterality: N/A;    Family History  Family Status  Relation Status Death Age  . Mother Alive   . Father Alive   . Sister Alive   . Brother Alive         Her family history includes Anxiety disorder in her mother; Arrhythmia in her father; Cancer in her father; Hypertension in her father and mother; Mitral valve prolapse in her mother.    No Known Allergies  Previous Medications   ERGOCALCIFEROL (VITAMIN D2) 2000 UNITS TABS    Take by mouth daily. PM   FLUTICASONE (FLONASE) 50 MCG/ACT NASAL SPRAY    Place into the nose daily as needed. AM   LORATADINE (CLARITIN) 10  MG TABLET    Take by mouth daily as needed. AM   METOPROLOL SUCCINATE (TOPROL-XL) 50 MG 24 HR TABLET    TAKE 1 TABLET BY MOUTH DAILY   SERTRALINE (ZOLOFT) 25 MG TABLET    One to two tabs daily    Patient Care Team: Margarita Rana, MD as PCP - General (Family Medicine)     Objective:   Vitals: BP 126/78 mmHg  Pulse 90  Temp(Src) 98 F (36.7 C) (Oral)  Resp 16  Ht 5\' 8"  (1.727 m)  Wt 198 lb (89.812 kg)  BMI 30.11 kg/m2  SpO2 98%  LMP 10/26/2015 (Exact Date)   Physical Exam  Constitutional: She is oriented to person, place, and time. She appears well-developed and well-nourished.  HENT:  Head: Normocephalic and atraumatic.  Right Ear: Tympanic membrane, external ear and ear canal normal.  Left Ear: Tympanic membrane, external ear and ear canal normal.  Nose: Nose normal.  Mouth/Throat: Uvula is midline, oropharynx is clear and moist and mucous membranes are normal.  Eyes: Conjunctivae, EOM and lids are normal. Pupils are equal, round, and reactive to light.  Neck: Trachea normal and normal range of motion. Neck supple. Carotid bruit is not present. No thyroid mass and no thyromegaly present.  Cardiovascular: Normal rate, regular rhythm and normal heart sounds.   Pulmonary/Chest: Effort normal and breath sounds normal.  Abdominal: Soft. Normal appearance and bowel sounds are normal. There is no hepatosplenomegaly. There is no tenderness.  Genitourinary: No breast swelling, tenderness or discharge.  Musculoskeletal: Normal range of motion.  Lymphadenopathy:    She has no cervical adenopathy.    She has no axillary adenopathy.  Neurological: She is alert and oriented to person, place, and time. She has normal strength. No cranial nerve deficit.  Skin: Skin is warm, dry and intact.  Psychiatric: She has a normal mood and affect. Her speech is normal and behavior is normal. Judgment and thought content normal. Cognition and memory are normal.     Depression Screen PHQ 2/9 Scores  10/29/2015  PHQ - 2 Score 1      Assessment & Plan:     Routine Health Maintenance and Physical Exam  Exercise Activities and Dietary recommendations Goals    None      Immunization History  Administered Date(s) Administered  . Influenza,inj,Quad PF,36+ Mos 05/24/2015  . Tdap 03/24/2011       1. Annual physical exam Stable. Patient advised to continue eating healthy and exercise daily. - POCT urinalysis dipstick  2. Flank pain New problem. F/U pending lab report. - Urine culture  3. Adjustment disorder with other symptom New problem. Patient started on alprazolam 0.5 mg as below.  - ALPRAZolam (XANAX) 0.5 MG tablet; Take 1 tablet (0.5 mg total) by mouth at bedtime as needed for anxiety.  Dispense: 30 tablet; Refill: 0  4. Essential (primary) hypertension Condition is stable. Please continue current medication and  plan of care as noted.  Will check labs.   - CBC with Differential/Platelet - Comprehensive metabolic panel  5. Breast cancer screening - MM DIGITAL SCREENING BILATERAL; Future   6. Allergic rhinitis, unspecified allergic rhinitis type Not at goal. Will send in rx - montelukast (SINGULAIR) 10 MG tablet; Take 1 tablet (10 mg total) by mouth at bedtime.  Dispense: 30 tablet; Refill: 3    Patient seen and examined by Dr. Jerrell Belfast, and note scribed by Philbert Riser. Dimas, CMA.  I have reviewed the document for accuracy and completeness and I agree with above. Jerrell Belfast, MD   Margarita Rana, MD   --------------------------------------------------------------------

## 2015-10-30 LAB — COMPREHENSIVE METABOLIC PANEL
A/G RATIO: 1.7 (ref 1.1–2.5)
ALBUMIN: 4.1 g/dL (ref 3.5–5.5)
ALT: 13 IU/L (ref 0–32)
AST: 18 IU/L (ref 0–40)
Alkaline Phosphatase: 54 IU/L (ref 39–117)
BILIRUBIN TOTAL: 0.5 mg/dL (ref 0.0–1.2)
BUN / CREAT RATIO: 21 (ref 9–23)
BUN: 18 mg/dL (ref 6–24)
CALCIUM: 9.3 mg/dL (ref 8.7–10.2)
CHLORIDE: 101 mmol/L (ref 96–106)
CO2: 26 mmol/L (ref 18–29)
Creatinine, Ser: 0.84 mg/dL (ref 0.57–1.00)
GFR, EST AFRICAN AMERICAN: 93 mL/min/{1.73_m2} (ref >59)
GFR, EST NON AFRICAN AMERICAN: 81 mL/min/{1.73_m2} (ref >59)
GLOBULIN, TOTAL: 2.4 g/dL (ref 1.5–4.5)
Glucose: 84 mg/dL (ref 65–99)
POTASSIUM: 4.4 mmol/L (ref 3.5–5.2)
SODIUM: 141 mmol/L (ref 134–144)
TOTAL PROTEIN: 6.5 g/dL (ref 6.0–8.5)

## 2015-10-30 LAB — CBC WITH DIFFERENTIAL/PLATELET
BASOS: 0 %
Basophils Absolute: 0 10*3/uL (ref 0.0–0.2)
EOS (ABSOLUTE): 0 10*3/uL (ref 0.0–0.4)
Eos: 1 %
HEMOGLOBIN: 12.9 g/dL (ref 11.1–15.9)
Hematocrit: 39.2 % (ref 34.0–46.6)
IMMATURE GRANS (ABS): 0 10*3/uL (ref 0.0–0.1)
IMMATURE GRANULOCYTES: 0 %
Lymphocytes Absolute: 1.2 10*3/uL (ref 0.7–3.1)
Lymphs: 22 %
MCH: 29.8 pg (ref 26.6–33.0)
MCHC: 32.9 g/dL (ref 31.5–35.7)
MCV: 91 fL (ref 79–97)
MONOCYTES: 6 %
Monocytes Absolute: 0.3 10*3/uL (ref 0.1–0.9)
NEUTROS ABS: 3.8 10*3/uL (ref 1.4–7.0)
Neutrophils: 71 %
Platelets: 314 10*3/uL (ref 150–379)
RBC: 4.33 x10E6/uL (ref 3.77–5.28)
RDW: 13.2 % (ref 12.3–15.4)
WBC: 5.3 10*3/uL (ref 3.4–10.8)

## 2015-10-30 LAB — URINE CULTURE

## 2015-11-07 ENCOUNTER — Other Ambulatory Visit: Payer: Self-pay | Admitting: Family Medicine

## 2015-11-07 DIAGNOSIS — F4329 Adjustment disorder with other symptoms: Secondary | ICD-10-CM

## 2015-11-14 LAB — HM MAMMOGRAPHY

## 2015-12-04 ENCOUNTER — Encounter: Payer: Self-pay | Admitting: Family Medicine

## 2015-12-16 ENCOUNTER — Other Ambulatory Visit: Payer: Self-pay | Admitting: Family Medicine

## 2015-12-16 DIAGNOSIS — I10 Essential (primary) hypertension: Secondary | ICD-10-CM

## 2016-03-04 ENCOUNTER — Other Ambulatory Visit: Payer: Self-pay | Admitting: Family Medicine

## 2016-03-04 DIAGNOSIS — J309 Allergic rhinitis, unspecified: Secondary | ICD-10-CM

## 2016-03-04 NOTE — Telephone Encounter (Signed)
LOV 10/29/2015. Renaldo Fiddler, CMA

## 2016-04-09 ENCOUNTER — Encounter: Payer: Self-pay | Admitting: Family Medicine

## 2016-04-09 ENCOUNTER — Ambulatory Visit (INDEPENDENT_AMBULATORY_CARE_PROVIDER_SITE_OTHER): Payer: BC Managed Care – PPO | Admitting: Family Medicine

## 2016-04-09 VITALS — BP 122/80 | HR 81 | Temp 97.7°F | Resp 16 | Wt 212.0 lb

## 2016-04-09 DIAGNOSIS — J309 Allergic rhinitis, unspecified: Secondary | ICD-10-CM | POA: Diagnosis not present

## 2016-04-09 MED ORDER — HYDROCODONE-HOMATROPINE 5-1.5 MG/5ML PO SYRP: ORAL_SOLUTION | ORAL | 0 refills | Status: DC

## 2016-04-09 MED ORDER — PREDNISONE 10 MG PO TABS: ORAL_TABLET | ORAL | 0 refills | Status: DC

## 2016-04-09 NOTE — Patient Instructions (Signed)
Let me know if not improving or you see purulent drainage.

## 2016-04-09 NOTE — Progress Notes (Signed)
Subjective:     Patient ID: Amanda Day, female   DOB: 1966/04/27, 50 y.o.   MRN: [id number]  HPI  Chief Complaint  Patient presents with  . Cough    Patient comes in office today with concerns of cough for the past week or more. Patient describes cough as tightness in her chest, she has had symptoms of congestion, sinus pressure and runny nose. Patient states that over the past several days symptoms have gradually got worse. patient has been taking otc Loratadine  States she came off of her Claritin at the end of July to see if she still needed it. Remained on steroid nasal spray and Singulair. She then noticed increased sinus congestion with PND and accompanying cough.Denies purulent drainage and has also started using her Neti pot.   Review of Systems     Objective:   Physical Exam  Constitutional: She appears well-developed and well-nourished. No distress.  Ears: T.M's intact without inflammation Sinuses: mild maxillary sinus tenderness Throat: no tonsillar enlargement or exudate Neck: no cervical adenopathy Lungs: clear     Assessment:    1. Allergic rhinitis, unspecified allergic rhinitis typ - predniSONE (DELTASONE) 10 MG tablet; Taper daily as follows: 6 pills, 5, 4, 3, 2, 1  Dispense: 21 tablet; Refill: 0 - HYDROcodone-homatropine (HYCODAN) 5-1.5 MG/5ML syrup; 5 ml 4-6 hours as needed for cough  Dispense: 240 mL; Refill: 0    Plan:    She will call if not improving or sees purulent sinus drainage.

## 2016-04-20 ENCOUNTER — Encounter: Payer: Self-pay | Admitting: Family Medicine

## 2016-04-20 ENCOUNTER — Ambulatory Visit (INDEPENDENT_AMBULATORY_CARE_PROVIDER_SITE_OTHER): Payer: BC Managed Care – PPO | Admitting: Family Medicine

## 2016-04-20 VITALS — BP 134/84 | HR 79 | Temp 98.1°F | Resp 15 | Wt 206.6 lb

## 2016-04-20 DIAGNOSIS — J40 Bronchitis, not specified as acute or chronic: Secondary | ICD-10-CM

## 2016-04-20 MED ORDER — AZITHROMYCIN 250 MG PO TABS
ORAL_TABLET | ORAL | 0 refills | Status: DC
Start: 2016-04-20 — End: 2016-12-02

## 2016-04-20 NOTE — Progress Notes (Signed)
Subjective:     Patient ID: Amanda Day, female   DOB: November 16, 1965, 50 y.o.   MRN: [id number]  HPI  Chief Complaint  Patient presents with  . Allergic Rhinitis     Patient comes in office today for follow up visit. Last visit was 04/09/16 patient was prescribed prednisone 10mg  and Hycodan. Patient states that she had slight improvement when on prednisone but after finishing her symptoms returned. Patient reports heaviness in her chest, productive cough and ear congestion.   States sinus congestion remains clear but cough is increasingly productive of clear sputum. Remains on her allergy medication.   Review of Systems     Objective:   Physical Exam  Constitutional: She appears well-developed and well-nourished. No distress.  Ears: T.M's intact without inflammation Throat: no tonsillar enlargement or exudate Neck: no cervical adenopathy Lungs: clear     Assessment:    1. Bronchitis - azithromycin (ZITHROMAX Z-PAK) 250 MG tablet; 2 pills the first day then one pill daily  Dispense: 6 each; Refill: 0    Plan:    If no improvement will refer to allergist.

## 2016-04-20 NOTE — Patient Instructions (Signed)
Consider allergist evaluation if not improving.

## 2016-04-27 ENCOUNTER — Ambulatory Visit
Admission: RE | Admit: 2016-04-27 | Discharge: 2016-04-27 | Disposition: A | Discharge status: Home / Self Care | Payer: BC Managed Care – PPO | Source: Ambulatory Visit | Attending: Chiropractic Medicine | Admitting: Chiropractic Medicine

## 2016-04-27 ENCOUNTER — Other Ambulatory Visit: Payer: Self-pay | Admitting: Chiropractic Medicine

## 2016-04-27 DIAGNOSIS — M25562 Pain in left knee: Secondary | ICD-10-CM

## 2016-05-06 ENCOUNTER — Other Ambulatory Visit: Payer: Self-pay | Admitting: Family Medicine

## 2016-05-10 ENCOUNTER — Other Ambulatory Visit: Payer: Self-pay | Admitting: Chiropractic Medicine

## 2016-05-12 ENCOUNTER — Other Ambulatory Visit: Payer: Self-pay | Admitting: Chiropractic Medicine

## 2016-05-12 DIAGNOSIS — M791 Myalgia, unspecified site: Secondary | ICD-10-CM

## 2016-05-12 DIAGNOSIS — M25562 Pain in left knee: Secondary | ICD-10-CM

## 2016-05-21 ENCOUNTER — Ambulatory Visit
Admission: RE | Admit: 2016-05-21 | Discharge: 2016-05-21 | Disposition: A | Discharge status: Home / Self Care | Payer: BC Managed Care – PPO | Source: Ambulatory Visit | Attending: Chiropractic Medicine | Admitting: Chiropractic Medicine

## 2016-05-21 DIAGNOSIS — M791 Myalgia, unspecified site: Secondary | ICD-10-CM

## 2016-05-21 DIAGNOSIS — M25562 Pain in left knee: Secondary | ICD-10-CM

## 2016-06-15 ENCOUNTER — Other Ambulatory Visit: Payer: Self-pay | Admitting: Family Medicine

## 2016-06-15 DIAGNOSIS — I10 Essential (primary) hypertension: Secondary | ICD-10-CM

## 2016-07-07 ENCOUNTER — Other Ambulatory Visit: Payer: Self-pay | Admitting: Family Medicine

## 2016-07-07 DIAGNOSIS — J309 Allergic rhinitis, unspecified: Secondary | ICD-10-CM

## 2016-09-01 ENCOUNTER — Other Ambulatory Visit: Payer: Self-pay | Admitting: Physician Assistant

## 2016-09-01 DIAGNOSIS — J309 Allergic rhinitis, unspecified: Secondary | ICD-10-CM

## 2016-09-27 NOTE — Patient Instructions (Signed)
  Your procedure is scheduled on: 10-04-16 [facility]) Report to Same Day Surgery 2nd floor medical mall [facility] Entrance-take elevator on left to 2nd floor.  Check in with surgery information desk.) To find out your arrival time please call [phone] between 1PM - 3PM on 10-01-16 (FRIDAY)  Remember: Instructions that are not followed completely may result in serious medical risk, up to and including death, or upon the discretion of your surgeon and anesthesiologist your surgery may need to be rescheduled.    _x___ 1. Do not eat food or drink liquids after midnight. No gum chewing or hard candies.     __x__ 2. No Alcohol for 24 hours before or after surgery.   __x__3. No Smoking for 24 prior to surgery.   ____  4. Bring all medications with you on the day of surgery if instructed.    __x__ 5. Notify your doctor if there is any change in your medical condition     (cold, fever, infections).     Do not wear jewelry, make-up, hairpins, clips or nail polish.  Do not wear lotions, powders, or perfumes. You may wear deodorant.  Do not shave 48 hours prior to surgery. Men may shave face and neck.  Do not bring valuables to the hospital.    [facility] is not responsible for any belongings or valuables.               Contacts, dentures or bridgework may not be worn into surgery.  Leave your suitcase in the car. After surgery it may be brought to your room.  For patients admitted to the hospital, discharge time is determined by your treatment team.   Patients discharged the day of surgery will not be allowed to drive home.  You will need someone to drive you home and stay with you the night of your procedure.    Please read over the following fact sheets that you were given:   [facility] Preparing for Surgery and or MRSA Information   _x___ Take these medicines the morning of surgery with A SIP OF WATER:    1. METOPROLOL  2. SERTRALINE (ZOLOFT)  3. PRILOSEC  4. TAKE A PRILOSEC  BEFORE BED ON Sunday NIGHT (10-03-16)  5.  6.  ____Fleets enema or Magnesium Citrate as directed.   _x___ Use CHG Soap or sage wipes as directed on instruction sheet   ____ Use inhalers on the day of surgery and bring to hospital day of surgery  ____ Stop metformin 2 days prior to surgery    ____ Take 1/2 of usual insulin dose the night before surgery and none on the morning of  surgery.   ____ Stop Aspirin, Coumadin, Pllavix ,Eliquis, Effient, or Pradaxa  x__ Stop Anti-inflammatories such as Advil, Aleve, Ibuprofen, Motrin, Naproxen,          Naprosyn, Goodies powders or aspirin products. Ok to take Tylenol.   ____ Stop supplements until after surgery.    ____ Bring C-Pap to the hospital.

## 2016-09-28 ENCOUNTER — Encounter
Admission: RE | Admit: 2016-09-28 | Discharge: 2016-09-28 | Disposition: A | Discharge status: Home / Self Care | Payer: BC Managed Care – PPO | Source: Ambulatory Visit | Attending: Orthopedic Surgery | Admitting: Orthopedic Surgery

## 2016-09-28 DIAGNOSIS — I10 Essential (primary) hypertension: Secondary | ICD-10-CM | POA: Diagnosis not present

## 2016-09-28 HISTORY — DX: Essential (primary) hypertension: I10

## 2016-09-28 HISTORY — DX: Gastro-esophageal reflux disease without esophagitis: K21.9

## 2016-09-28 HISTORY — DX: Anxiety disorder, unspecified: F41.9

## 2016-10-04 ENCOUNTER — Ambulatory Visit: Payer: BC Managed Care – PPO | Admitting: Registered Nurse

## 2016-10-04 ENCOUNTER — Encounter
Admission: RE | Disposition: A | Discharge status: Home / Self Care | Payer: Self-pay | Source: Ambulatory Visit | Attending: Orthopedic Surgery

## 2016-10-04 ENCOUNTER — Ambulatory Visit
Admission: RE | Admit: 2016-10-04 | Discharge: 2016-10-05 | Disposition: A | Discharge status: Home / Self Care | Payer: BC Managed Care – PPO | Source: Ambulatory Visit | Attending: Orthopedic Surgery | Admitting: Orthopedic Surgery

## 2016-10-04 ENCOUNTER — Encounter: Payer: Self-pay | Admitting: *Deleted

## 2016-10-04 DIAGNOSIS — I10 Essential (primary) hypertension: Secondary | ICD-10-CM | POA: Diagnosis not present

## 2016-10-04 DIAGNOSIS — W11XXXA Fall on and from ladder, initial encounter: Secondary | ICD-10-CM | POA: Diagnosis not present

## 2016-10-04 DIAGNOSIS — F419 Anxiety disorder, unspecified: Secondary | ICD-10-CM | POA: Insufficient documentation

## 2016-10-04 DIAGNOSIS — Z6831 Body mass index (BMI) 31.0-31.9, adult: Secondary | ICD-10-CM | POA: Diagnosis not present

## 2016-10-04 DIAGNOSIS — Z79899 Other long term (current) drug therapy: Secondary | ICD-10-CM | POA: Diagnosis not present

## 2016-10-04 DIAGNOSIS — S83242A Other tear of medial meniscus, current injury, left knee, initial encounter: Secondary | ICD-10-CM | POA: Insufficient documentation

## 2016-10-04 DIAGNOSIS — M94262 Chondromalacia, left knee: Secondary | ICD-10-CM | POA: Diagnosis not present

## 2016-10-04 DIAGNOSIS — M2392 Unspecified internal derangement of left knee: Secondary | ICD-10-CM | POA: Diagnosis present

## 2016-10-04 DIAGNOSIS — K219 Gastro-esophageal reflux disease without esophagitis: Secondary | ICD-10-CM | POA: Diagnosis not present

## 2016-10-04 DIAGNOSIS — E669 Obesity, unspecified: Secondary | ICD-10-CM | POA: Insufficient documentation

## 2016-10-04 DIAGNOSIS — S83282A Other tear of lateral meniscus, current injury, left knee, initial encounter: Secondary | ICD-10-CM | POA: Diagnosis not present

## 2016-10-04 HISTORY — PX: KNEE ARTHROSCOPY: SHX127

## 2016-10-04 LAB — POCT PREGNANCY, URINE: Preg Test, Ur: NEGATIVE

## 2016-10-04 SURGERY — ARTHROSCOPY, KNEE
Anesthesia: General | Site: Knee | Laterality: Left | Wound class: Clean Contaminated

## 2016-10-04 MED ORDER — ONDANSETRON HCL 4 MG/2ML IJ SOLN
INTRAMUSCULAR | Status: AC
  Filled 2016-10-04: qty 2

## 2016-10-04 MED ORDER — MORPHINE SULFATE (PF) 4 MG/ML IV SOLN
INTRAVENOUS | Status: DC | PRN
  Administered 2016-10-04: 4 mg

## 2016-10-04 MED ORDER — LIDOCAINE HCL (CARDIAC) 20 MG/ML IV SOLN
INTRAVENOUS | Status: DC | PRN
  Administered 2016-10-04: 50 mg via INTRAVENOUS

## 2016-10-04 MED ORDER — PHENYLEPHRINE HCL 10 MG/ML IJ SOLN
INTRAMUSCULAR | Status: DC | PRN
  Administered 2016-10-04 (×3): 100 ug via INTRAVENOUS

## 2016-10-04 MED ORDER — PROPOFOL 10 MG/ML IV BOLUS
INTRAVENOUS | Status: AC
  Filled 2016-10-04: qty 20

## 2016-10-04 MED ORDER — BUPIVACAINE-EPINEPHRINE (PF) 0.25% -1:200000 IJ SOLN
INTRAMUSCULAR | Status: DC | PRN
  Administered 2016-10-04: 30 mL

## 2016-10-04 MED ORDER — LIDOCAINE HCL (PF) 2 % IJ SOLN
INTRAMUSCULAR | Status: AC
  Filled 2016-10-04: qty 2

## 2016-10-04 MED ORDER — ACETAMINOPHEN 500 MG PO TABS
1000.0000 mg | ORAL_TABLET | Freq: Once | ORAL | Status: AC
  Administered 2016-10-04: 1000 mg via ORAL

## 2016-10-04 MED ORDER — GLYCOPYRROLATE 0.2 MG/ML IJ SOLN
INTRAMUSCULAR | Status: AC
  Filled 2016-10-04: qty 1

## 2016-10-04 MED ORDER — BUPIVACAINE-EPINEPHRINE (PF) 0.25% -1:200000 IJ SOLN
INTRAMUSCULAR | Status: AC
  Filled 2016-10-04: qty 30

## 2016-10-04 MED ORDER — FENTANYL CITRATE (PF) 100 MCG/2ML IJ SOLN
INTRAMUSCULAR | Status: DC | PRN
  Administered 2016-10-04 (×4): 50 ug via INTRAVENOUS

## 2016-10-04 MED ORDER — ONDANSETRON HCL 4 MG/2ML IJ SOLN
INTRAMUSCULAR | Status: DC | PRN
  Administered 2016-10-04: 4 mg via INTRAVENOUS

## 2016-10-04 MED ORDER — HYDROCODONE-ACETAMINOPHEN 5-325 MG PO TABS
1.0000 | ORAL_TABLET | ORAL | Status: DC | PRN
  Administered 2016-10-04 (×2): 1 via ORAL

## 2016-10-04 MED ORDER — FENTANYL CITRATE (PF) 100 MCG/2ML IJ SOLN
INTRAMUSCULAR | Status: AC
  Filled 2016-10-04: qty 2

## 2016-10-04 MED ORDER — ACETAMINOPHEN 500 MG PO TABS
ORAL_TABLET | ORAL | Status: AC
  Filled 2016-10-04: qty 2

## 2016-10-04 MED ORDER — CHLORHEXIDINE GLUCONATE 4 % EX LIQD: 60.0000 mL | Freq: Once | CUTANEOUS | Status: DC

## 2016-10-04 MED ORDER — EPHEDRINE 5 MG/ML INJ
INTRAVENOUS | Status: AC
  Filled 2016-10-04: qty 10

## 2016-10-04 MED ORDER — FENTANYL CITRATE (PF) 100 MCG/2ML IJ SOLN
INTRAMUSCULAR | Status: AC
  Administered 2016-10-04: 25 ug via INTRAVENOUS
  Filled 2016-10-04: qty 2

## 2016-10-04 MED ORDER — ACETAMINOPHEN 10 MG/ML IV SOLN
INTRAVENOUS | Status: AC
  Filled 2016-10-04: qty 100

## 2016-10-04 MED ORDER — ONDANSETRON HCL 4 MG/2ML IJ SOLN: 4.0000 mg | Freq: Four times a day (QID) | INTRAMUSCULAR | Status: DC | PRN

## 2016-10-04 MED ORDER — MIDAZOLAM HCL 2 MG/2ML IJ SOLN
INTRAMUSCULAR | Status: DC | PRN
  Administered 2016-10-04: 2 mg via INTRAVENOUS

## 2016-10-04 MED ORDER — PROPOFOL 10 MG/ML IV BOLUS
INTRAVENOUS | Status: DC | PRN
  Administered 2016-10-04: 160 mg via INTRAVENOUS

## 2016-10-04 MED ORDER — HYDROCODONE-ACETAMINOPHEN 5-325 MG PO TABS
ORAL_TABLET | ORAL | Status: AC
  Administered 2016-10-04: 1 via ORAL
  Filled 2016-10-04: qty 1

## 2016-10-04 MED ORDER — FENTANYL CITRATE (PF) 100 MCG/2ML IJ SOLN
25.0000 ug | INTRAMUSCULAR | Status: AC | PRN
  Administered 2016-10-04 (×6): 25 ug via INTRAVENOUS

## 2016-10-04 MED ORDER — METOCLOPRAMIDE HCL 5 MG/ML IJ SOLN: 5.0000 mg | Freq: Three times a day (TID) | INTRAMUSCULAR | Status: DC | PRN

## 2016-10-04 MED ORDER — SUCCINYLCHOLINE CHLORIDE 200 MG/10ML IV SOSY
PREFILLED_SYRINGE | INTRAVENOUS | Status: AC
  Filled 2016-10-04: qty 10

## 2016-10-04 MED ORDER — ONDANSETRON HCL 4 MG/2ML IJ SOLN: 4.0000 mg | Freq: Once | INTRAMUSCULAR | Status: DC | PRN

## 2016-10-04 MED ORDER — EPHEDRINE SULFATE 50 MG/ML IJ SOLN
INTRAMUSCULAR | Status: DC | PRN
  Administered 2016-10-04: 10 mg via INTRAVENOUS

## 2016-10-04 MED ORDER — SODIUM CHLORIDE 0.9 % IV SOLN: INTRAVENOUS | Status: DC

## 2016-10-04 MED ORDER — METOCLOPRAMIDE HCL 10 MG PO TABS: 5.0000 mg | ORAL_TABLET | Freq: Three times a day (TID) | ORAL | Status: DC | PRN

## 2016-10-04 MED ORDER — MIDAZOLAM HCL 2 MG/2ML IJ SOLN
INTRAMUSCULAR | Status: AC
  Filled 2016-10-04: qty 2

## 2016-10-04 MED ORDER — ACETAMINOPHEN 10 MG/ML IV SOLN
INTRAVENOUS | Status: DC | PRN
  Administered 2016-10-04: 1000 mg via INTRAVENOUS

## 2016-10-04 MED ORDER — HYDROCODONE-ACETAMINOPHEN 5-325 MG PO TABS
ORAL_TABLET | ORAL | Status: AC
  Filled 2016-10-04: qty 1

## 2016-10-04 MED ORDER — MORPHINE SULFATE (PF) 4 MG/ML IV SOLN
INTRAVENOUS | Status: AC
  Filled 2016-10-04: qty 1

## 2016-10-04 MED ORDER — LACTATED RINGERS IV SOLN
INTRAVENOUS | Status: DC
  Administered 2016-10-04 (×4): via INTRAVENOUS

## 2016-10-04 MED ORDER — ONDANSETRON HCL 4 MG PO TABS: 4.0000 mg | ORAL_TABLET | Freq: Four times a day (QID) | ORAL | Status: DC | PRN

## 2016-10-04 SURGICAL SUPPLY — 23 items
BLADE SHAVER 4.5 DBL SERAT CV (CUTTER) ×3 IMPLANT
BNDG ESMARK 6X12 TAN STRL LF (GAUZE/BANDAGES/DRESSINGS) ×3 IMPLANT
CUFF TOURN 24 STER (MISCELLANEOUS) ×3 IMPLANT
CUFF TOURN 30 STER DUAL PORT (MISCELLANEOUS) IMPLANT
DRSG DERMACEA 8X12 NADH (GAUZE/BANDAGES/DRESSINGS) ×3 IMPLANT
DURAPREP 26ML APPLICATOR (WOUND CARE) ×6 IMPLANT
GAUZE SPONGE 4X4 12PLY STRL (GAUZE/BANDAGES/DRESSINGS) ×3 IMPLANT
GLOVE BIOGEL M STRL SZ7.5 (GLOVE) ×3 IMPLANT
GLOVE INDICATOR 8.0 STRL GRN (GLOVE) ×3 IMPLANT
GOWN STRL REUS W/ TWL LRG LVL3 (GOWN DISPOSABLE) ×2 IMPLANT
GOWN STRL REUS W/TWL LRG LVL3 (GOWN DISPOSABLE) ×4
IV LACTATED RINGER IRRG 3000ML (IV SOLUTION) ×8
IV LR IRRIG 3000ML ARTHROMATIC (IV SOLUTION) ×4 IMPLANT
KIT RM TURNOVER STRD PROC AR (KITS) ×3 IMPLANT
MANIFOLD NEPTUNE II (INSTRUMENTS) ×3 IMPLANT
PACK ARTHROSCOPY KNEE (MISCELLANEOUS) ×3 IMPLANT
SET TUBE SUCT SHAVER OUTFL 24K (TUBING) ×3 IMPLANT
SET TUBE TIP INTRA-ARTICULAR (MISCELLANEOUS) ×3 IMPLANT
SUT ETHILON 3-0 FS-10 30 BLK (SUTURE) ×3
SUTURE EHLN 3-0 FS-10 30 BLK (SUTURE) ×1 IMPLANT
TUBING ARTHRO INFLOW-ONLY STRL (TUBING) ×3 IMPLANT
WAND HAND CNTRL MULTIVAC 50 (MISCELLANEOUS) ×3 IMPLANT
WRAP KNEE W/COLD PACKS 25.5X14 (SOFTGOODS) ×3 IMPLANT

## 2016-10-04 NOTE — Anesthesia Procedure Notes (Signed)
Procedure Name: LMA Insertion Date/Time: 10/04/2016 8:47 PM Performed by: Lendon Colonel Pre-anesthesia Checklist: Patient identified, Patient being monitored, Timeout performed, Emergency Drugs available and Suction available Patient Re-evaluated:Patient Re-evaluated prior to inductionOxygen Delivery Method: Circle system utilized Preoxygenation: Pre-oxygenation with 100% oxygen Intubation Type: IV induction Ventilation: Mask ventilation without difficulty LMA: LMA inserted LMA Size: 4.0 Tube type: Oral Number of attempts: 1 Placement Confirmation: positive ETCO2 and breath sounds checked- equal and bilateral Tube secured with: Tape Dental Injury: Teeth and Oropharynx as per pre-operative assessment

## 2016-10-04 NOTE — Anesthesia Post-op Follow-up Note (Cosign Needed)
Anesthesia QCDR form completed.        

## 2016-10-04 NOTE — Transfer of Care (Signed)
Immediate Anesthesia Transfer of Care Note  Patient: Amanda Day  Procedure(s) Performed: Procedure(s): ARTHROSCOPY KNEE (Left)  Patient Location: PACU  Anesthesia Type:General  Level of Consciousness: awake, alert , oriented and patient cooperative  Airway & Oxygen Therapy: Patient Spontanous Breathing  Post-op Assessment: Report given to RN and Post -op Vital signs reviewed and stable  Post vital signs: Reviewed and stable  Last Vitals:  Vitals:   10/04/16 1414 10/04/16 2230  BP: (!) 152/98 (!) (P) 147/89  Pulse: 75 98  Resp: 16 14  Temp: 36.4 C 36.2 C    Last Pain:  Vitals:   10/04/16 1940  TempSrc:   PainSc: 1          Complications: No apparent anesthesia complications

## 2016-10-04 NOTE — Brief Op Note (Signed)
10/04/2016  10:40 PM  PATIENT:  Amanda Day  51 y.o. female  PRE-OPERATIVE DIAGNOSIS:  INTERNAL DERANGEMENT OF LEFT KNEE  POST-OPERATIVE DIAGNOSIS:  medial meniscus tear; anterior and posterior release;tear posterior horn lateral meniscus  PROCEDURE:  Procedure(s): ARTHROSCOPY KNEE (Left)  SURGEON:  Surgeon(s) and Role:    * Dereck Leep, MD - Primary  ASSISTANTS: none   ANESTHESIA:   general  EBL:  Total I/O In: 1000 [I.V.:1000] Out: 15 [Blood:15]  BLOOD ADMINISTERED:none  DRAINS: none   LOCAL MEDICATIONS USED:  MARCAINE     SPECIMEN:  No Specimen  DISPOSITION OF SPECIMEN:  N/A  COUNTS:  YES  TOURNIQUET:  * No tourniquets in log *  DICTATION: .Dragon Dictation  PLAN OF CARE: Discharge to home after PACU  PATIENT DISPOSITION:  PACU - hemodynamically stable.   Delay start of Pharmacological VTE agent (>24hrs) due to surgical blood loss or risk of bleeding: not applicable

## 2016-10-04 NOTE — H&P (Signed)
The patient has been re-examined, and the chart reviewed, and there have been no interval changes to the documented history and physical.    The risks, benefits, and alternatives have been discussed at length. The patient expressed understanding of the risks benefits and agreed with plans for surgical intervention.  Lorris Carducci P. Grady Lucci, Jr. M.D.    

## 2016-10-04 NOTE — OR Nursing (Signed)
Dr  Marry Guan in to talk to patient case delayed for an ED emergency

## 2016-10-04 NOTE — Op Note (Signed)
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   ASSISTANT: none  ANESTHESIA: general  ESTIMATED BLOOD LOSS: Minimal  FLUIDS REPLACED: 1000 mL of crystalloid  TOURNIQUET TIME: Not used   DRAINS: none  IMPLANTS UTILIZED: None  INDICATIONS FOR SURGERY: Amanda Day is a 51 y.o. year old female who has been seen for complaints of left knee pain. MRI demonstrated findings consistent with meniscal pathology. After discussion of the risks and benefits of surgical intervention, the patient expressed understanding of the risks benefits and agree with plans for left knee arthroscopy.   PROCEDURE IN DETAIL: The patient was brought into the operating room and, after adequate general anesthesia was achieved, a tourniquet was applied to the left thigh and the leg was placed in the leg holder. All bony prominences were well padded. The patient's left knee was cleaned and prepped with alcohol and Duraprep and draped in the usual sterile fashion. A "timeout" was performed as per usual protocol. The anticipated portal sites were injected with 0.25% Marcaine with epinephrine. An anterolateral incision was made and a cannula was inserted. A large effusion was evacuated and the knee was distended with fluid using the pump. The scope was advanced down the medial gutter into the medial compartment. Under visualization with the scope, an anteromedial portal was created and a hooked probe was inserted. The  medial meniscus was visualized and probed. There was a degenerative tear of the anterior horn of the medial meniscus and a complex degenerative tear of the posterior horn of the medial meniscus. The tears were debrided using meniscal punches and a 4.5 mm incisor shaver. Final contouring was performed using the 50 ArthroCare wand. The remaining rim meniscus was visualized and probed and felt to be stable. The articular cartilage was visualized and was felt to be in good condition.  The scope was then advanced into the intercondylar notch. The anterior cruciate ligament was visualized and probed and felt to be intact. The scope was removed from the lateral portal and reinserted via the anteromedial portal to better visualize the lateral compartment. The lateral meniscus was visualized and probed. There was a flap type lesion involving the posterior horn of the lateral meniscus. The tear was debrided using the 4.5 mm incisor shaver. Final contouring was performed using the ArthroCare wand. The remaining rim of meniscus was visualized and probed and felt stable. The articular cartilage of the lateral compartment was visualized. There was softening and mild changes involving the lateral tibial plateau. The area was debrided and contoured using the ArthroCare wand. Finally, the scope was advanced so as to visualize the patellofemoral articulation. Good patellar tracking was appreciated. The articular surface was in good condition.  The knee was irrigated with copius amounts of fluid and suctioned dry. The anterolateral portal was re-approximated with #3-0 nylon. A combination of 0.25% Marcaine with epinephrine and 4 mg of Morphine were injected via the scope. The scope was removed and the anteromedial portal was re-approximated with #3-0 nylon. A sterile  dressing was applied followed by application of an ice wrap.  The patient tolerated the procedure well and was transported to the PACU in stable  condition.  Amanda Day P. Holley Bouche., M.D.

## 2016-10-04 NOTE — Anesthesia Preprocedure Evaluation (Addendum)
Anesthesia Evaluation  Patient identified by MRN, date of birth, ID band Patient awake    Reviewed: Allergy & Precautions, NPO status , Patient's Chart, lab work & pertinent test results, reviewed documented beta blocker date and time   Airway Mallampati: III  TM Distance: >3 FB     Dental  (+) Chipped   Pulmonary           Cardiovascular hypertension, Pt. on medications and Pt. on home beta blockers      Neuro/Psych PSYCHIATRIC DISORDERS Anxiety    GI/Hepatic GERD  Controlled,  Endo/Other    Renal/GU      Musculoskeletal  (+) Arthritis ,   Abdominal   Peds  Hematology   Anesthesia Other Findings Obese. Hypertensive this am.  otion sickness not bad. Does not want patch. LMA ok.  Reproductive/Obstetrics                            Anesthesia Physical Anesthesia Plan  ASA: III  Anesthesia Plan: General   Post-op Pain Management:    Induction: Intravenous  Airway Management Planned: LMA  Additional Equipment:   Intra-op Plan:   Post-operative Plan:   Informed Consent: I have reviewed the patients History and Physical, chart, labs and discussed the procedure including the risks, benefits and alternatives for the proposed anesthesia with the patient or authorized representative who has indicated his/her understanding and acceptance.     Plan Discussed with: CRNA  Anesthesia Plan Comments:         Anesthesia Quick Evaluation

## 2016-10-04 NOTE — Discharge Instructions (Signed)
AMBULATORY SURGERY  °DISCHARGE INSTRUCTIONS ° ° °1) The drugs that you were given will stay in your system until tomorrow so for the next 24 hours you should not: ° °A) Drive an automobile °B) Make any legal decisions °C) Drink any alcoholic beverage ° ° °2) You may resume regular meals tomorrow.  Today it is better to start with liquids and gradually work up to solid foods. ° °You may eat anything you prefer, but it is better to start with liquids, then soup and crackers, and gradually work up to solid foods. ° ° °3) Please notify your doctor immediately if you have any unusual bleeding, trouble breathing, redness and pain at the surgery site, drainage, fever, or pain not relieved by medication. ° ° ° °4) Additional Instructions: ° ° ° ° ° ° ° °Please contact your physician with any problems or Same Day Surgery at [phone], Monday through Friday 6 am to 4 pm, or [place] [facility] number at [phone]. ° ° °Instructions after Knee Arthroscopy  ° ° James P. Hooten, Jr., M.D.    ° Dept. of Orthopaedics & Sports Medicine ° [facility] ° [address] ° [place], [place]  [address] ° ° Phone: [phone]   Fax: [phone] ° ° °DIET: °• Drink plenty of non-alcoholic fluids & begin a light diet. °• Resume your normal diet the day after surgery. ° °ACTIVITY:  °• You may use crutches or a walker with weight-bearing as tolerated, unless instructed otherwise. °• You may wean yourself off of the walker or crutches as tolerated.  °• Begin doing gentle exercises. Exercising will reduce the pain and swelling, increase motion, and prevent muscle weakness.   °• Avoid strenuous activities or athletics for a minimum of 4-6 weeks after arthroscopic surgery. °• Do not drive or operate any equipment until instructed. ° °WOUND CARE:  °• Place one to two pillows under the knee the first day or two when sitting or lying.  °• Continue to use the ice packs periodically to reduce pain and swelling. °• The small  incisions in your knee are closed with nylon stitches. The stitches will be removed in the office. °• The bulky dressing may be removed on the second day after surgery. DO NOT TOUCH THE STITCHES. Put a Band-Aid over each stitch. Do NOT use any ointments or creams on the incisions.  °• You may bathe or shower after the stitches are removed at the first office visit following surgery. ° °MEDICATIONS: °• You may resume your regular medications. °• Please take the pain medication as prescribed. °• Do not take pain medication on an empty stomach. °• Do not drive or drink alcoholic beverages when taking pain medications. ° °CALL THE OFFICE FOR: °• Temperature above 101 degrees °• Excessive bleeding or drainage on the dressing. °• Excessive swelling, coldness, or paleness of the toes. °• Persistent nausea and vomiting. ° °FOLLOW-UP:  °• You should have an appointment to return to the office in 7-10 days after surgery.  °  °

## 2016-10-05 ENCOUNTER — Encounter: Payer: Self-pay | Admitting: Orthopedic Surgery

## 2016-10-05 NOTE — Anesthesia Postprocedure Evaluation (Signed)
Anesthesia Post Note  Patient: Amanda Day  Procedure(s) Performed: Procedure(s) (LRB): ARTHROSCOPY KNEE (Left)  Patient location during evaluation: PACU Anesthesia Type: General Level of consciousness: awake and alert Pain management: pain level controlled Vital Signs Assessment: post-procedure vital signs reviewed and stable Respiratory status: spontaneous breathing, nonlabored ventilation, respiratory function stable and patient connected to nasal cannula oxygen Cardiovascular status: blood pressure returned to baseline and stable Postop Assessment: no signs of nausea or vomiting Anesthetic complications: no     Last Vitals:  Vitals:   10/05/16 0015 10/05/16 0040  BP:  (!) 168/92  Pulse: 68 66  Resp:    Temp:  36.4 C    Last Pain:  Vitals:   10/05/16 0040  TempSrc:   PainSc: [place]

## 2016-10-05 NOTE — Anesthesia Postprocedure Evaluation (Signed)
Anesthesia Post Note  Patient: NELL MILBOURNE  Procedure(s) Performed: Procedure(s) (LRB): ARTHROSCOPY KNEE (Left)  Patient location during evaluation: PACU Anesthesia Type: General Level of consciousness: awake and alert Pain management: pain level controlled Vital Signs Assessment: post-procedure vital signs reviewed and stable Respiratory status: spontaneous breathing, nonlabored ventilation, respiratory function stable and patient connected to nasal cannula oxygen Cardiovascular status: blood pressure returned to baseline and stable Postop Assessment: no signs of nausea or vomiting Anesthetic complications: no     Last Vitals:  Vitals:   10/05/16 0015 10/05/16 0040  BP:  (!) 168/92  Pulse: 68 66  Resp:    Temp:  36.4 C    Last Pain:  Vitals:   10/05/16 0040  TempSrc:   PainSc: [place]

## 2016-10-30 ENCOUNTER — Other Ambulatory Visit: Payer: Self-pay | Admitting: Physician Assistant

## 2016-11-21 DIAGNOSIS — Z9889 Other specified postprocedural states: Secondary | ICD-10-CM

## 2016-11-21 HISTORY — DX: Other specified postprocedural states: Z98.890

## 2016-12-02 ENCOUNTER — Ambulatory Visit
Admission: RE | Admit: 2016-12-02 | Discharge: 2016-12-02 | Disposition: A | Discharge status: Home / Self Care | Payer: BC Managed Care – PPO | Source: Ambulatory Visit | Attending: Family Medicine | Admitting: Family Medicine

## 2016-12-02 ENCOUNTER — Encounter: Payer: Self-pay | Admitting: Family Medicine

## 2016-12-02 ENCOUNTER — Ambulatory Visit (INDEPENDENT_AMBULATORY_CARE_PROVIDER_SITE_OTHER): Payer: BC Managed Care – PPO | Admitting: Family Medicine

## 2016-12-02 ENCOUNTER — Other Ambulatory Visit: Payer: Self-pay | Admitting: Family Medicine

## 2016-12-02 VITALS — BP 142/102 | HR 73 | Temp 98.6°F | Resp 16 | Wt 214.8 lb

## 2016-12-02 DIAGNOSIS — M25561 Pain in right knee: Secondary | ICD-10-CM

## 2016-12-02 NOTE — Patient Instructions (Signed)
Discussed use of daily meloxicam and using Tylenol extra strength two pill up to 3 x day (max 3000 mg/day)

## 2016-12-02 NOTE — Progress Notes (Signed)
Subjective:     Patient ID: Amanda Day, female   DOB: 1966-05-19, 51 y.o.   MRN: [id number]  HPI  Chief Complaint  Patient presents with  . Knee Pain    Patient comes in office today with concerns of right knee pain since the weekend. 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 Patient states "it felt like it had shifted", patient reports swelling around knee and pain when walking. Patient has been taking otc Ibuprofen and Meloxicam as well as elevating and applying ice.   Has recovered from L knee arthroscopic surgery performed in February.    Review of Systems     Objective:   Physical Exam  Constitutional: She appears well-developed and well-nourished. No distress.  Cardiovascular:  Pulses:      Dorsalis pedis pulses are 2+ on the right side.       Posterior tibial pulses are 2+ on the right side.  Musculoskeletal: She exhibits no edema (of right lower extremity).  Right knee without overlying erythema or effusion. Mild tenderness and medial tibial plateau. No increased pain with patellar palpation. Crepitus with ranging/increased pain > 90 degrees of flexion. Knee ligaments stable to testing. McMurray's test negative.       Assessment:    1. Acute pain of right knee - DG Knee Complete 4 Views Right; Future    Plan:    Discussed scheduling only one nsaid and adding Tylenol. Further f/u pending x-ray report.

## 2016-12-14 ENCOUNTER — Other Ambulatory Visit: Payer: Self-pay | Admitting: Physician Assistant

## 2016-12-14 DIAGNOSIS — I10 Essential (primary) hypertension: Secondary | ICD-10-CM

## 2017-01-06 ENCOUNTER — Other Ambulatory Visit: Payer: Self-pay | Admitting: Physician Assistant

## 2017-01-06 ENCOUNTER — Ambulatory Visit (INDEPENDENT_AMBULATORY_CARE_PROVIDER_SITE_OTHER): Payer: BC Managed Care – PPO | Admitting: Physician Assistant

## 2017-01-06 ENCOUNTER — Encounter: Payer: Self-pay | Admitting: Physician Assistant

## 2017-01-06 DIAGNOSIS — Z1239 Encounter for other screening for malignant neoplasm of breast: Secondary | ICD-10-CM

## 2017-01-06 DIAGNOSIS — Z9889 Other specified postprocedural states: Secondary | ICD-10-CM

## 2017-01-06 DIAGNOSIS — J309 Allergic rhinitis, unspecified: Secondary | ICD-10-CM | POA: Diagnosis not present

## 2017-01-06 DIAGNOSIS — I10 Essential (primary) hypertension: Secondary | ICD-10-CM | POA: Diagnosis not present

## 2017-01-06 DIAGNOSIS — Z0000 Encounter for general adult medical examination without abnormal findings: Secondary | ICD-10-CM | POA: Diagnosis not present

## 2017-01-06 DIAGNOSIS — Z1231 Encounter for screening mammogram for malignant neoplasm of breast: Secondary | ICD-10-CM

## 2017-01-06 DIAGNOSIS — Z124 Encounter for screening for malignant neoplasm of cervix: Secondary | ICD-10-CM

## 2017-01-06 NOTE — Patient Instructions (Signed)
Health Maintenance, Female Adopting a healthy lifestyle and getting preventive care can go a long way to promote health and wellness. Talk with your health care provider about what schedule of regular examinations is right for you. This is a good chance for you to check in with your provider about disease prevention and staying healthy. In between checkups, there are plenty of things you can do on your own. Experts have done a lot of research about which lifestyle changes and preventive measures are most likely to keep you healthy. Ask your health care provider for more information. Weight and diet Eat a healthy diet  Be sure to include plenty of vegetables, fruits, low-fat dairy products, and lean protein.  Do not eat a lot of foods high in solid fats, added sugars, or salt.  Get regular exercise. This is one of the most important things you can do for your health.  Most adults should exercise for at least 150 minutes each week. The exercise should increase your heart rate and make you sweat (moderate-intensity exercise).  Most adults should also do strengthening exercises at least twice a week. This is in addition to the moderate-intensity exercise. Maintain a healthy weight  Body mass index (BMI) is a measurement that can be used to identify possible weight problems. It estimates body fat based on height and weight. Your health care provider can help determine your BMI and help you achieve or maintain a healthy weight.  For females 76 years of age and older:  A BMI below 18.5 is considered underweight.  A BMI of 18.5 to 24.9 is normal.  A BMI of 25 to 29.9 is considered overweight.  A BMI of 30 and above is considered obese. Watch levels of cholesterol and blood lipids  You should start having your blood tested for lipids and cholesterol at 52 years of age, then have this test every 5 years.  You may need to have your cholesterol levels checked more often if:  Your lipid or  cholesterol levels are high.  You are older than 52 years of age.  You are at high risk for heart disease. Cancer screening Lung Cancer  Lung cancer screening is recommended for adults 64-42 years old who are at high risk for lung cancer because of a history of smoking.  A yearly low-dose CT scan of the lungs is recommended for people who:  Currently smoke.  Have quit within the past 15 years.  Have at least a 30-pack-year history of smoking. A pack year is smoking an average of one pack of cigarettes a day for 1 year.  Yearly screening should continue until it has been 15 years since you quit.  Yearly screening should stop if you develop a health problem that would prevent you from having lung cancer treatment. Breast Cancer  Practice breast self-awareness. This means understanding how your breasts normally appear and feel.  It also means doing regular breast self-exams. Let your health care provider know about any changes, no matter how small.  If you are in your 20s or 30s, you should have a clinical breast exam (CBE) by a health care provider every 1-3 years as part of a regular health exam.  If you are 34 or older, have a CBE every year. Also consider having a breast X-ray (mammogram) every year.  If you have a family history of breast cancer, talk to your health care provider about genetic screening.  If you are at high risk for breast cancer, talk  to your health care provider about having an MRI and a mammogram every year.  Breast cancer gene (BRCA) assessment is recommended for women who have family members with BRCA-related cancers. BRCA-related cancers include:  Breast.  Ovarian.  Tubal.  Peritoneal cancers.  Results of the assessment will determine the need for genetic counseling and BRCA1 and BRCA2 testing. Cervical Cancer  Your health care provider may recommend that you be screened regularly for cancer of the pelvic organs (ovaries, uterus, and vagina).  This screening involves a pelvic examination, including checking for microscopic changes to the surface of your cervix (Pap test). You may be encouraged to have this screening done every 3 years, beginning at age 24.  For women ages 66-65, health care providers may recommend pelvic exams and Pap testing every 3 years, or they may recommend the Pap and pelvic exam, combined with testing for human papilloma virus (HPV), every 5 years. Some types of HPV increase your risk of cervical cancer. Testing for HPV may also be done on women of any age with unclear Pap test results.  Other health care providers may not recommend any screening for nonpregnant women who are considered low risk for pelvic cancer and who do not have symptoms. Ask your health care provider if a screening pelvic exam is right for you.  If you have had past treatment for cervical cancer or a condition that could lead to cancer, you need Pap tests and screening for cancer for at least 20 years after your treatment. If Pap tests have been discontinued, your risk factors (such as having a new sexual partner) need to be reassessed to determine if screening should resume. Some women have medical problems that increase the chance of getting cervical cancer. In these cases, your health care provider may recommend more frequent screening and Pap tests. Colorectal Cancer  This type of cancer can be detected and often prevented.  Routine colorectal cancer screening usually begins at 52 years of age and continues through 52 years of age.  Your health care provider may recommend screening at an earlier age if you have risk factors for colon cancer.  Your health care provider may also recommend using home test kits to check for hidden blood in the stool.  A small camera at the end of a tube can be used to examine your colon directly (sigmoidoscopy or colonoscopy). This is done to check for the earliest forms of colorectal cancer.  Routine  screening usually begins at age 41.  Direct examination of the colon should be repeated every 5-10 years through 52 years of age. However, you may need to be screened more often if early forms of precancerous polyps or small growths are found. Skin Cancer  Check your skin from head to toe regularly.  Tell your health care provider about any new moles or changes in moles, especially if there is a change in a mole's shape or color.  Also tell your health care provider if you have a mole that is larger than the size of a pencil eraser.  Always use sunscreen. Apply sunscreen liberally and repeatedly throughout the day.  Protect yourself by wearing long sleeves, pants, a wide-brimmed hat, and sunglasses whenever you are outside. Heart disease, diabetes, and high blood pressure  High blood pressure causes heart disease and increases the risk of stroke. High blood pressure is more likely to develop in:  People who have blood pressure in the high end of the normal range (130-139/85-89 mm Hg).  People who are overweight or obese.  People who are African American.  If you are 59-24 years of age, have your blood pressure checked every 3-5 years. If you are 34 years of age or older, have your blood pressure checked every year. You should have your blood pressure measured twice-once when you are at a hospital or clinic, and once when you are not at a hospital or clinic. Record the average of the two measurements. To check your blood pressure when you are not at a hospital or clinic, you can use:  An automated blood pressure machine at a pharmacy.  A home blood pressure monitor.  If you are between 29 years and 60 years old, ask your health care provider if you should take aspirin to prevent strokes.  Have regular diabetes screenings. This involves taking a blood sample to check your fasting blood sugar level.  If you are at a normal weight and have a low risk for diabetes, have this test once  every three years after 52 years of age.  If you are overweight and have a high risk for diabetes, consider being tested at a younger age or more often. Preventing infection Hepatitis B  If you have a higher risk for hepatitis B, you should be screened for this virus. You are considered at high risk for hepatitis B if:  You were born in a country where hepatitis B is common. Ask your health care provider which countries are considered high risk.  Your parents were born in a high-risk country, and you have not been immunized against hepatitis B (hepatitis B vaccine).  You have HIV or AIDS.  You use needles to inject street drugs.  You live with someone who has hepatitis B.  You have had sex with someone who has hepatitis B.  You get hemodialysis treatment.  You take certain medicines for conditions, including cancer, organ transplantation, and autoimmune conditions. Hepatitis C  Blood testing is recommended for:  Everyone born from [birth year] through 1966.  Anyone with known risk factors for hepatitis C. Sexually transmitted infections (STIs)  You should be screened for sexually transmitted infections (STIs) including gonorrhea and chlamydia if:  You are sexually active and are younger than 52 years of age.  You are older than 52 years of age and your health care provider tells you that you are at risk for this type of infection.  Your sexual activity has changed since you were last screened and you are at an increased risk for chlamydia or gonorrhea. Ask your health care provider if you are at risk.  If you do not have HIV, but are at risk, it may be recommended that you take a prescription medicine daily to prevent HIV infection. This is called pre-exposure prophylaxis (PrEP). You are considered at risk if:  You are sexually active and do not regularly use condoms or know the HIV status of your partner(s).  You take drugs by injection.  You are sexually active with a partner  who has HIV. Talk with your health care provider about whether you are at high risk of being infected with HIV. If you choose to begin PrEP, you should first be tested for HIV. You should then be tested every 3 months for as long as you are taking PrEP. Pregnancy  If you are premenopausal and you may become pregnant, ask your health care provider about preconception counseling.  If you may become pregnant, take 400 to 800 micrograms (mcg) of folic acid  every day.  If you want to prevent pregnancy, talk to your health care provider about birth control (contraception). Osteoporosis and menopause  Osteoporosis is a disease in which the bones lose minerals and strength with aging. This can result in serious bone fractures. Your risk for osteoporosis can be identified using a bone density scan.  If you are 4 years of age or older, or if you are at risk for osteoporosis and fractures, ask your health care provider if you should be screened.  Ask your health care provider whether you should take a calcium or vitamin D supplement to lower your risk for osteoporosis.  Menopause may have certain physical symptoms and risks.  Hormone replacement therapy may reduce some of these symptoms and risks. Talk to your health care provider about whether hormone replacement therapy is right for you. Follow these instructions at home:  Schedule regular health, dental, and eye exams.  Stay current with your immunizations.  Do not use any tobacco products including cigarettes, chewing tobacco, or electronic cigarettes.  If you are pregnant, do not drink alcohol.  If you are breastfeeding, limit how much and how often you drink alcohol.  Limit alcohol intake to no more than 1 drink per day for nonpregnant women. One drink equals 12 ounces of beer, 5 ounces of wine, or 1 ounces of hard liquor.  Do not use street drugs.  Do not share needles.  Ask your health care provider for help if you need support  or information about quitting drugs.  Tell your health care provider if you often feel depressed.  Tell your health care provider if you have ever been abused or do not feel safe at home. This information is not intended to replace advice given to you by your health care provider. Make sure you discuss any questions you have with your health care provider. Document Released: 02/22/2011 Document Revised: 01/15/2016 Document Reviewed: 05/13/2015 Elsevier Interactive Patient Education  2017 [employer].

## 2017-01-06 NOTE — Progress Notes (Signed)
Patient: Amanda Day, Female    DOB: [birth year], 51 y.o.   MRN: [id number] Visit Date: 01/06/2017  Today's Provider: Trinna Post, PA-C   Chief Complaint  Patient presents with  . Annual Exam   Subjective:    Annual physical exam Amanda Day is a 51 y.o. female who presents today for health maintenance and complete physical. She feels fairly well. She reports exercising some.  Pt is having trouble with knee pain.  She had surgery on her left knee 09/2016 and now has right knee pain.   She reports she is sleeping well.  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 She has met a new partner and is sexually active. No concerns for STI but is requesting a PAP/HPV. Last PAP/HPV was normal in 2015. History of precancerous cervical lesions removed when she had  She lives in [place]. She has two sons, aged 51 and 30. Her oldest son is getting married next month. She does not smoke. She drinks five glasses of wine a week.   She is currently taking Zoloft 50 mg daily for anxiety. She was tapering down to 25 mg a couple years ago but then she went through a divorce with her husband. Her anxiety increased when she found out that he remarried one of his graduate students, so currently she is taking 50 mg zoloft daily. She had a prescription of Xanax which she took two of and then discontinued.  She takes Claritin, Singulair and Flonase for allergies.  She takes Prilosec for heartburn. ON 15 mg Meloxicam daily for left knee s/p arthroscopic repair of meniscal tear. Her right knee is now hurting her, she is going to see Marylee Floras at orthopedics.   She is taking metropolol XL 50 mg with no adverse side effects for hypertension.  Last mammogram 2017 normal. Colonoscopy in 2016, normal, due in 2026 if no change in family history. No history of colon or breast  cancer. -----------------------------------------------------------------   Review of Systems  Constitutional: Negative.   HENT: Negative.   Eyes: Negative.   Respiratory: Negative.   Gastrointestinal: Positive for diarrhea. Negative for abdominal distention, abdominal pain, anal bleeding, blood in stool, constipation, nausea, rectal pain and vomiting.  Endocrine: Negative.   Genitourinary: Negative.   Musculoskeletal: Positive for neck pain and neck stiffness. Negative for arthralgias, back pain, gait problem, joint swelling and myalgias.  Skin: Negative.   Allergic/Immunologic: Negative.   Neurological: Negative.   Hematological: Negative.   Psychiatric/Behavioral: Negative.     Social History      She  reports that she has never smoked. She has never used smokeless tobacco. She reports that she drinks alcohol. She reports that she does not use drugs.       Social History   Social History  . Marital status: Divorced    Spouse name: N/A  . Number of children: N/A  . Years of education: N/A   Social History Main Topics  . Smoking status: Never Smoker  . Smokeless tobacco: Never Used  . Alcohol use Yes     Comment: 4 nights a week, wine or beer  . Drug use: No  . Sexual activity: Not on file   Other Topics Concern  . Not on file   Social History Narrative  . No narrative on file    Past Medical History:  Diagnosis Date  . Anxiety   . Arthritis  right great toe  . GERD (gastroesophageal reflux disease)    OCC  . Hypertension   . Motion sickness    long car rides, amusement park rides  . Seasonal allergies      Patient Active Problem List   Diagnosis Date Noted  . S/P arthroscopy of left knee 11/21/2016  . Knee sprain 03/21/2015  . Special screening for malignant neoplasms, colon   . Adaptation reaction 12/26/2014  . Allergic rhinitis 12/26/2014  . Blood pressure elevated 12/26/2014  . Essential (primary) hypertension 12/26/2014  . Acid reflux  12/26/2014  . Avitaminosis D 12/26/2014    Past Surgical History:  Procedure Laterality Date  . COLONOSCOPY N/A 02/21/2015   Procedure: COLONOSCOPY;  Surgeon: Lucilla Lame, MD;  Location: [facility];  Service: Gastroenterology;  Laterality: N/A;  . KNEE ARTHROSCOPY Left 10/04/2016   Procedure: ARTHROSCOPY KNEE;  Surgeon: Dereck Leep, MD;  Location: [facility];  Service: Orthopedics;  Laterality: Left;  . 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 TUBAL LIGATION  2009    Family History        Family Status  Relation Status  . Mother Alive  . Father Alive  . Sister Alive  . Brother Alive        Her family history includes Anxiety disorder in her mother; Arrhythmia in her father; Cancer in her father; Hypertension in her father and mother; Mitral valve prolapse in her mother.     Not on File   Current Outpatient Prescriptions:  .  ALPRAZolam (XANAX) 0.5 MG tablet, Take 1 tablet (0.5 mg total) by mouth at bedtime as needed for anxiety., Disp: 30 tablet, Rfl: 0 .  Ergocalciferol (VITAMIN D2) 2000 UNITS TABS, Take 1 tablet by mouth every evening. , Disp: , Rfl:  .  fluticasone (FLONASE) 50 MCG/ACT nasal spray, SHAKE LIQUID AND USE 2 SPRAYS IN EACH NOSTRIL EVERY DAY AS NEEDED (Patient taking differently: SHAKE LIQUID AND USE 2 SPRAYS IN EACH NOSTRIL EVERY DAY), Disp: 48 g, Rfl: 1 .  ibuprofen (ADVIL,MOTRIN) 200 MG tablet, Take 400 mg by mouth every 8 (eight) hours as needed for mild pain., Disp: , Rfl:  .  loratadine (CLARITIN) 10 MG tablet, Take 10 mg by mouth daily. , Disp: , Rfl:  .  meloxicam (MOBIC) 15 MG tablet, Take by mouth., Disp: , Rfl:  .  metoprolol succinate (TOPROL-XL) 50 MG 24 hr tablet, TAKE 1 TABLET BY MOUTH EVERY DAY, Disp: 90 tablet, Rfl: 1 .  montelukast (SINGULAIR) 10 MG tablet, TAKE 1 TABLET(10 MG) BY MOUTH AT BEDTIME, Disp: 30 tablet, Rfl: 11 .  omeprazole (PRILOSEC) 10 MG capsule, Take 10 mg by mouth as needed., Disp: , Rfl:   .  sertraline (ZOLOFT) 50 MG tablet, TAKE 1/2 TO 1 TABLET BY MOUTH EVERY DAY, Disp: 30 tablet, Rfl: 5   Patient Care Team: Rubye Beach as PCP - General (Family Medicine)      Objective:   Vitals: LMP 03/27/2016 (Approximate)   There were no vitals filed for this visit.   Physical Exam  Constitutional: She is oriented to person, place, and time. She appears well-developed and well-nourished.  HENT:  Right Ear: Tympanic membrane and external ear normal.  Left Ear: Tympanic membrane and external ear normal.  Mouth/Throat: Oropharynx is clear and moist. No oropharyngeal exudate.  Neck: Neck supple.  Cardiovascular: Normal rate, regular rhythm and normal heart sounds.   Pulmonary/Chest: Effort normal and  breath sounds normal. No respiratory distress. She has no wheezes. She has no rales. Right breast exhibits no inverted nipple, no mass, no nipple discharge, no skin change and no tenderness. Left breast exhibits no inverted nipple, no mass, no nipple discharge, no skin change and no tenderness. Breasts are symmetrical.  Chaperoned breast and GU exam  Abdominal: Soft. Bowel sounds are normal. She exhibits no distension. There is no tenderness. There is no rebound and no guarding.  Genitourinary: No breast swelling, tenderness, discharge or bleeding. There is no rash, tenderness, lesion or injury on the right labia. There is no rash, tenderness, lesion or injury on the left labia. Uterus is not deviated, not enlarged, not fixed and not tender. Cervix exhibits no motion tenderness, no discharge and no friability. Right adnexum displays no mass, no tenderness and no fullness. Left adnexum displays no mass, no tenderness and no fullness. No erythema, tenderness or bleeding in the vagina. No foreign body in the vagina. No signs of injury around the vagina. No vaginal discharge found.  Lymphadenopathy:    She has no cervical adenopathy.  Neurological: She is alert and oriented to  person, place, and time.  Skin: Skin is warm and dry.  Psychiatric: She has a normal mood and affect. Her behavior is normal.     Depression Screen PHQ 2/9 Scores 10/29/2015  PHQ - 2 Score 1      Assessment & Plan:     Routine Health Maintenance and Physical Exam  Exercise Activities and Dietary recommendations Goals    None      Immunization History  Administered Date(s) Administered  . Influenza,inj,Quad PF,36+ Mos 05/24/2015  . Tdap 03/24/2011    Health Maintenance  Topic Date Due  . HIV Screening  09/19/1979  . PAP SMEAR  09/19/2016  . INFLUENZA VACCINE  03/23/2017  . MAMMOGRAM  11/13/2017  . TETANUS/TDAP  03/23/2021  . COLONOSCOPY  02/20/2025     Discussed health benefits of physical activity, and encouraged her to engage in regular exercise appropriate for her age and condition.    1. Annual physical exam  - Comprehensive metabolic panel - Lipid panel  2. Cervical cancer screening  - Pap IG and HPV (high risk) DNA detection  3. Breast cancer screening  Gets mammogram at [facility].  - MM DIGITAL SCREENING BILATERAL; Future  4. Allergic rhinitis, unspecified seasonality, unspecified trigger  Stable on medications. Considering allergy testing.  5. Essential (primary) hypertension  Stable today, pulse good.   6. S/P arthroscopy of left knee  Slowly getting back into exercise.  Return in about 1 year (around 01/06/2018) for CPE.  The entirety of the information documented in the History of Present Illness, Review of Systems and Physical Exam were personally obtained by me. Portions of this information were initially documented by Ashley Royalty, CMA and reviewed by me for thoroughness and accuracy.    --------------------------------------------------------------------    Trinna Post, PA-C  [facility]

## 2017-01-07 ENCOUNTER — Other Ambulatory Visit: Payer: Self-pay | Admitting: Family Medicine

## 2017-01-07 DIAGNOSIS — J309 Allergic rhinitis, unspecified: Secondary | ICD-10-CM

## 2017-01-11 LAB — PAP IG AND HPV HIGH-RISK
HPV, high-risk: NEGATIVE
PAP Smear Comment: 0

## 2017-01-11 LAB — PLEASE NOTE

## 2017-01-28 LAB — LIPID PANEL
Chol/HDL Ratio: 4.5 ratio — ABNORMAL HIGH (ref 0.0–4.4)
Cholesterol, Total: 225 mg/dL — ABNORMAL HIGH (ref 100–199)
HDL: 50 mg/dL (ref >39)
LDL Calculated: 153 mg/dL — ABNORMAL HIGH (ref 0–99)
Triglycerides: 109 mg/dL (ref 0–149)
VLDL Cholesterol Cal: 22 mg/dL (ref 5–40)

## 2017-01-28 LAB — COMPREHENSIVE METABOLIC PANEL
ALT: 18 IU/L (ref 0–32)
AST: 22 IU/L (ref 0–40)
Albumin/Globulin Ratio: 1.6 (ref 1.2–2.2)
Albumin: 4.1 g/dL (ref 3.5–5.5)
Alkaline Phosphatase: 60 IU/L (ref 39–117)
BUN/Creatinine Ratio: 26 — ABNORMAL HIGH (ref 9–23)
BUN: 22 mg/dL (ref 6–24)
Bilirubin Total: 0.3 mg/dL (ref 0.0–1.2)
CO2: 24 mmol/L (ref 18–29)
Calcium: 9.3 mg/dL (ref 8.7–10.2)
Chloride: 104 mmol/L (ref 96–106)
Creatinine, Ser: 0.86 mg/dL (ref 0.57–1.00)
GFR calc Af Amer: 90 mL/min/{1.73_m2} (ref >59)
GFR calc non Af Amer: 78 mL/min/{1.73_m2} (ref >59)
Globulin, Total: 2.5 g/dL (ref 1.5–4.5)
Glucose: 87 mg/dL (ref 65–99)
Potassium: 4.4 mmol/L (ref 3.5–5.2)
Sodium: 145 mmol/L — ABNORMAL HIGH (ref 134–144)
Total Protein: 6.6 g/dL (ref 6.0–8.5)

## 2017-02-01 ENCOUNTER — Telehealth: Payer: Self-pay

## 2017-02-01 NOTE — Telephone Encounter (Signed)
-----   Message from Trinna Post, [place] sent at 01/28/2017 12:48 PM EDT ----- Lab results normal except for cholesterol, which is a little high but not indicating treatment. Please try to work on lifestyle changes like heart healthy diet and 30 min moderate exercise 5 days per week. Also, last PAP returned insufficient, but she does have normal one and neg HPV from 2015 so OKAY to wait for next physical if patient prefers. Thanks.

## 2017-02-01 NOTE — Telephone Encounter (Signed)
Pt advised.  She is going to wait until next year for her Pap.   Thanks,   -Mickel Baas

## 2017-03-07 ENCOUNTER — Other Ambulatory Visit: Payer: Self-pay | Admitting: Chiropractic Medicine

## 2017-03-07 DIAGNOSIS — M25561 Pain in right knee: Principal | ICD-10-CM

## 2017-03-07 DIAGNOSIS — G8929 Other chronic pain: Secondary | ICD-10-CM

## 2017-03-22 ENCOUNTER — Ambulatory Visit
Admission: RE | Admit: 2017-03-22 | Discharge: 2017-03-22 | Disposition: A | Discharge status: Home / Self Care | Payer: BC Managed Care – PPO | Source: Ambulatory Visit | Attending: Chiropractic Medicine | Admitting: Chiropractic Medicine

## 2017-03-22 DIAGNOSIS — M25561 Pain in right knee: Principal | ICD-10-CM

## 2017-03-22 DIAGNOSIS — G8929 Other chronic pain: Secondary | ICD-10-CM

## 2017-03-30 ENCOUNTER — Inpatient Hospital Stay: Admission: RE | Admit: 2017-03-30 | Payer: BC Managed Care – PPO | Source: Ambulatory Visit

## 2017-03-31 ENCOUNTER — Encounter
Admission: RE | Admit: 2017-03-31 | Discharge: 2017-03-31 | Disposition: A | Discharge status: Home / Self Care | Payer: BC Managed Care – PPO | Source: Ambulatory Visit | Attending: Orthopedic Surgery | Admitting: Orthopedic Surgery

## 2017-03-31 NOTE — Patient Instructions (Signed)
  Your procedure is scheduled on: 04-06-17 Report to Same Day Surgery 2nd floor medical mall [facility] Entrance-take elevator on left to 2nd floor.  Check in with surgery information desk.) To find out your arrival time please call [phone] between 1PM - 3PM on 04-05-17  Remember: Instructions that are not followed completely may result in serious medical risk, up to and including death, or upon the discretion of your surgeon and anesthesiologist your surgery may need to be rescheduled.    _x___ 1. Do not eat food or drink liquids after midnight. No gum chewing or   hard candies.     __x__ 2. No Alcohol for 24 hours before or after surgery.   __x__3. No Smoking for 24 prior to surgery.   ____  4. Bring all medications with you on the day of surgery if instructed.    __x__ 5. Notify your doctor if there is any change in your medical condition     (cold, fever, infections).     Do not wear jewelry, make-up, hairpins, clips or nail polish.  Do not wear lotions, powders, or perfumes. You may wear deodorant.  Do not shave 48 hours prior to surgery. Men may shave face and neck.  Do not bring valuables to the hospital.    [facility] is not responsible for any belongings or valuables.               Contacts, dentures or bridgework may not be worn into surgery.  Leave your suitcase in the car. After surgery it may be brought to your room.  For patients admitted to the hospital, discharge time is determined by your  treatment team.   Patients discharged the day of surgery will not be allowed to drive home.  You will need someone to drive you home and stay with you the night of your procedure.    Please read over the following fact sheets that you were given:   [place] and or MRSA Information   _x___ Take anti-hypertensive (unless it includes a diuretic), cardiac, seizure, asthma,     anti-reflux and psychiatric medicines. These include:  1. ZOLOFT  2.  METOPROLOL  3. PRILOSEC  4. TAKE AN EXTRA PRILOSEC THE NIGHT BEFORE SURGERY  5.  6.  ____Fleets enema or Magnesium Citrate as directed.   ____ Use CHG Soap or sage wipes as directed on instruction sheet   ____ Use inhalers on the day of surgery and bring to hospital day of surgery  ____ Stop Metformin and Janumet 2 days prior to surgery.    ____ Take 1/2 of usual insulin dose the night before surgery and none on the morning     surgery.   ____ Follow recommendations from Cardiologist, Pulmonologist or PCP regarding  stopping Aspirin, Coumadin, Pllavix ,Eliquis, Effient, or Pradaxa, and Pletal.  X____Stop Anti-inflammatories such as Advil, Aleve, IBUPROFEN, Motrin, Naproxen, Naprosyn, Goodies powders or aspirin products NOW-OK to take Tylenol    ____ Stop supplements until after surgery.    ____ Bring C-Pap to the hospital.

## 2017-04-06 ENCOUNTER — Ambulatory Visit
Admission: RE | Admit: 2017-04-06 | Discharge: 2017-04-06 | Disposition: A | Discharge status: Home / Self Care | Payer: BC Managed Care – PPO | Source: Ambulatory Visit | Attending: Orthopedic Surgery | Admitting: Orthopedic Surgery

## 2017-04-06 ENCOUNTER — Encounter
Admission: RE | Disposition: A | Discharge status: Home / Self Care | Payer: Self-pay | Source: Ambulatory Visit | Attending: Orthopedic Surgery

## 2017-04-06 ENCOUNTER — Ambulatory Visit: Payer: BC Managed Care – PPO | Admitting: Anesthesiology

## 2017-04-06 ENCOUNTER — Encounter: Payer: Self-pay | Admitting: Orthopedic Surgery

## 2017-04-06 DIAGNOSIS — M23221 Derangement of posterior horn of medial meniscus due to old tear or injury, right knee: Secondary | ICD-10-CM | POA: Diagnosis not present

## 2017-04-06 DIAGNOSIS — M94261 Chondromalacia, right knee: Secondary | ICD-10-CM | POA: Diagnosis not present

## 2017-04-06 DIAGNOSIS — M19071 Primary osteoarthritis, right ankle and foot: Secondary | ICD-10-CM | POA: Insufficient documentation

## 2017-04-06 DIAGNOSIS — Z79899 Other long term (current) drug therapy: Secondary | ICD-10-CM | POA: Diagnosis not present

## 2017-04-06 DIAGNOSIS — Z8249 Family history of ischemic heart disease and other diseases of the circulatory system: Secondary | ICD-10-CM | POA: Insufficient documentation

## 2017-04-06 DIAGNOSIS — F419 Anxiety disorder, unspecified: Secondary | ICD-10-CM | POA: Insufficient documentation

## 2017-04-06 DIAGNOSIS — I10 Essential (primary) hypertension: Secondary | ICD-10-CM | POA: Diagnosis not present

## 2017-04-06 DIAGNOSIS — Z809 Family history of malignant neoplasm, unspecified: Secondary | ICD-10-CM | POA: Diagnosis not present

## 2017-04-06 DIAGNOSIS — K219 Gastro-esophageal reflux disease without esophagitis: Secondary | ICD-10-CM | POA: Diagnosis not present

## 2017-04-06 HISTORY — PX: KNEE ARTHROSCOPY: SHX127

## 2017-04-06 SURGERY — ARTHROSCOPY, KNEE
Anesthesia: General | Site: Knee | Laterality: Right | Wound class: Clean

## 2017-04-06 MED ORDER — SODIUM CHLORIDE 0.9 % IV SOLN: INTRAVENOUS | Status: DC

## 2017-04-06 MED ORDER — FENTANYL CITRATE (PF) 100 MCG/2ML IJ SOLN
INTRAMUSCULAR | Status: AC
  Administered 2017-04-06: 25 ug via INTRAVENOUS
  Filled 2017-04-06: qty 2

## 2017-04-06 MED ORDER — DEXAMETHASONE SODIUM PHOSPHATE 4 MG/ML IJ SOLN
INTRAMUSCULAR | Status: DC | PRN
  Administered 2017-04-06: 5 mg via INTRAVENOUS

## 2017-04-06 MED ORDER — MIDAZOLAM HCL 2 MG/2ML IJ SOLN
INTRAMUSCULAR | Status: DC | PRN
Start: 2017-04-06 — End: 2017-04-06
  Administered 2017-04-06: 2 mg via INTRAVENOUS

## 2017-04-06 MED ORDER — MORPHINE SULFATE 4 MG/ML IJ SOLN
INTRAMUSCULAR | Status: DC | PRN
  Administered 2017-04-06: 4 mg

## 2017-04-06 MED ORDER — EPINEPHRINE PF 1 MG/ML IJ SOLN
INTRAMUSCULAR | Status: AC
  Filled 2017-04-06: qty 1

## 2017-04-06 MED ORDER — LACTATED RINGERS IV SOLN
INTRAVENOUS | Status: DC
Start: 2017-04-06 — End: 2017-04-06
  Administered 2017-04-06: 11:00:00 via INTRAVENOUS

## 2017-04-06 MED ORDER — LIDOCAINE HCL (CARDIAC) 20 MG/ML IV SOLN
INTRAVENOUS | Status: DC | PRN
  Administered 2017-04-06: 100 mg via INTRAVENOUS

## 2017-04-06 MED ORDER — HYDROCODONE-ACETAMINOPHEN 5-325 MG PO TABS
1.0000 | ORAL_TABLET | ORAL | Status: DC | PRN
  Administered 2017-04-06: 1 via ORAL

## 2017-04-06 MED ORDER — PHENYLEPHRINE HCL 10 MG/ML IJ SOLN
INTRAMUSCULAR | Status: DC | PRN
  Administered 2017-04-06 (×3): 100 ug via INTRAVENOUS

## 2017-04-06 MED ORDER — PROPOFOL 10 MG/ML IV BOLUS
INTRAVENOUS | Status: DC | PRN
  Administered 2017-04-06: 40 mg via INTRAVENOUS
  Administered 2017-04-06: 100 mg via INTRAVENOUS

## 2017-04-06 MED ORDER — DEXAMETHASONE SODIUM PHOSPHATE 10 MG/ML IJ SOLN
INTRAMUSCULAR | Status: AC
  Filled 2017-04-06: qty 1

## 2017-04-06 MED ORDER — ONDANSETRON HCL 4 MG PO TABS: 4.0000 mg | ORAL_TABLET | Freq: Four times a day (QID) | ORAL | Status: DC | PRN

## 2017-04-06 MED ORDER — BUPIVACAINE HCL (PF) 0.25 % IJ SOLN
INTRAMUSCULAR | Status: AC
  Filled 2017-04-06: qty 30

## 2017-04-06 MED ORDER — ONDANSETRON HCL 4 MG/2ML IJ SOLN
INTRAMUSCULAR | Status: DC | PRN
  Administered 2017-04-06: 4 mg via INTRAVENOUS

## 2017-04-06 MED ORDER — SODIUM CHLORIDE 0.9 % IV SOLN: INTRAVENOUS | Status: DC | PRN

## 2017-04-06 MED ORDER — HYDROCODONE-ACETAMINOPHEN 5-325 MG PO TABS: 1.0000 | ORAL_TABLET | ORAL | 0 refills | Status: DC | PRN

## 2017-04-06 MED ORDER — METOCLOPRAMIDE HCL 5 MG/ML IJ SOLN: 5.0000 mg | Freq: Three times a day (TID) | INTRAMUSCULAR | Status: DC | PRN

## 2017-04-06 MED ORDER — HYDROCODONE-ACETAMINOPHEN 5-325 MG PO TABS
ORAL_TABLET | ORAL | Status: AC
  Filled 2017-04-06: qty 1

## 2017-04-06 MED ORDER — CHLORHEXIDINE GLUCONATE 4 % EX LIQD: 60.0000 mL | Freq: Once | CUTANEOUS | Status: DC

## 2017-04-06 MED ORDER — PROPOFOL 10 MG/ML IV BOLUS
INTRAVENOUS | Status: AC
  Filled 2017-04-06: qty 20

## 2017-04-06 MED ORDER — FENTANYL CITRATE (PF) 100 MCG/2ML IJ SOLN
INTRAMUSCULAR | Status: DC | PRN
  Administered 2017-04-06 (×2): 50 ug via INTRAVENOUS

## 2017-04-06 MED ORDER — LIDOCAINE HCL (PF) 2 % IJ SOLN
INTRAMUSCULAR | Status: AC
  Filled 2017-04-06: qty 2

## 2017-04-06 MED ORDER — BUPIVACAINE-EPINEPHRINE 0.25% -1:200000 IJ SOLN
INTRAMUSCULAR | Status: DC | PRN
  Administered 2017-04-06: 5 mL
  Administered 2017-04-06: 25 mL

## 2017-04-06 MED ORDER — GLYCOPYRROLATE 0.2 MG/ML IJ SOLN
INTRAMUSCULAR | Status: DC | PRN
  Administered 2017-04-06: 0.2 mg via INTRAVENOUS

## 2017-04-06 MED ORDER — ONDANSETRON HCL 4 MG/2ML IJ SOLN: 4.0000 mg | Freq: Four times a day (QID) | INTRAMUSCULAR | Status: DC | PRN

## 2017-04-06 MED ORDER — GLYCOPYRROLATE 0.2 MG/ML IJ SOLN
INTRAMUSCULAR | Status: AC
  Filled 2017-04-06: qty 1

## 2017-04-06 MED ORDER — ONDANSETRON HCL 4 MG/2ML IJ SOLN: 4.0000 mg | Freq: Once | INTRAMUSCULAR | Status: DC | PRN

## 2017-04-06 MED ORDER — MORPHINE SULFATE (PF) 4 MG/ML IV SOLN
INTRAVENOUS | Status: AC
  Filled 2017-04-06: qty 1

## 2017-04-06 MED ORDER — FENTANYL CITRATE (PF) 100 MCG/2ML IJ SOLN
25.0000 ug | INTRAMUSCULAR | Status: DC | PRN
  Administered 2017-04-06 (×4): 25 ug via INTRAVENOUS

## 2017-04-06 MED ORDER — METOCLOPRAMIDE HCL 10 MG PO TABS: 5.0000 mg | ORAL_TABLET | Freq: Three times a day (TID) | ORAL | Status: DC | PRN

## 2017-04-06 MED ORDER — SODIUM CHLORIDE 0.9 % IV SOLN
INTRAVENOUS | Status: DC | PRN
  Administered 2017-04-06: 30 ug/min via INTRAVENOUS

## 2017-04-06 MED ORDER — FENTANYL CITRATE (PF) 250 MCG/5ML IJ SOLN
INTRAMUSCULAR | Status: AC
  Filled 2017-04-06: qty 5

## 2017-04-06 MED ORDER — MIDAZOLAM HCL 2 MG/2ML IJ SOLN
INTRAMUSCULAR | Status: AC
  Filled 2017-04-06: qty 2

## 2017-04-06 SURGICAL SUPPLY — 28 items
ADAPTER IRRIG TUBE 2 SPIKE SOL (ADAPTER) ×6 IMPLANT
BLADE SHAVER 4.5 DBL SERAT CV (CUTTER) IMPLANT
BNDG ESMARK 6X12 TAN STRL LF (GAUZE/BANDAGES/DRESSINGS) ×3 IMPLANT
CUFF TOURN 24 STER (MISCELLANEOUS) IMPLANT
CUFF TOURN 30 STER DUAL PORT (MISCELLANEOUS) ×3 IMPLANT
DRSG DERMACEA 8X12 NADH (GAUZE/BANDAGES/DRESSINGS) ×3 IMPLANT
DURAPREP 26ML APPLICATOR (WOUND CARE) ×6 IMPLANT
GAUZE SPONGE 4X4 12PLY STRL (GAUZE/BANDAGES/DRESSINGS) ×3 IMPLANT
GLOVE BIOGEL M STRL SZ7.5 (GLOVE) ×6 IMPLANT
GLOVE INDICATOR 8.0 STRL GRN (GLOVE) ×6 IMPLANT
GOWN STRL REUS W/ TWL LRG LVL3 (GOWN DISPOSABLE) ×2 IMPLANT
GOWN STRL REUS W/TWL LRG LVL3 (GOWN DISPOSABLE) ×4
IV LACTATED RINGER IRRG 3000ML (IV SOLUTION) ×8
IV LR IRRIG 3000ML ARTHROMATIC (IV SOLUTION) ×4 IMPLANT
KIT RM TURNOVER STRD PROC AR (KITS) ×3 IMPLANT
MANIFOLD NEPTUNE II (INSTRUMENTS) ×3 IMPLANT
NEEDLE FILTER BLUNT 18X 1/2SAF (NEEDLE) ×2
NEEDLE FILTER BLUNT 18X1 1/2 (NEEDLE) ×1 IMPLANT
PACK ARTHROSCOPY KNEE (MISCELLANEOUS) ×3 IMPLANT
SET TUBE SUCT SHAVER OUTFL 24K (TUBING) ×3 IMPLANT
SET TUBE TIP INTRA-ARTICULAR (MISCELLANEOUS) ×3 IMPLANT
SUT ETHILON 3-0 FS-10 30 BLK (SUTURE) ×3
SUTURE EHLN 3-0 FS-10 30 BLK (SUTURE) ×1 IMPLANT
SYR 3ML 18GX1 1/2 (SYRINGE) ×3 IMPLANT
SYR TB 1ML 27GX1/2 LL (SYRINGE) ×3 IMPLANT
TUBING ARTHRO INFLOW-ONLY STRL (TUBING) ×3 IMPLANT
WAND COBLATION FLOW 50 (SURGICAL WAND) ×3 IMPLANT
WRAP KNEE W/COLD PACKS 25.5X14 (SOFTGOODS) ×3 IMPLANT

## 2017-04-06 NOTE — Anesthesia Preprocedure Evaluation (Signed)
Anesthesia Evaluation  Patient identified by MRN, date of birth, ID band Patient awake    Reviewed: Allergy & Precautions, H&P , NPO status , Patient's Chart, lab work & pertinent test results, reviewed documented beta blocker date and time   Airway Mallampati: II  TM Distance: >3 FB Neck ROM: full    Dental  (+) Teeth Intact   Pulmonary neg pulmonary ROS,    Pulmonary exam normal        Cardiovascular Exercise Tolerance: Good hypertension, negative cardio ROS Normal cardiovascular exam Rate:Normal     Neuro/Psych PSYCHIATRIC DISORDERS negative neurological ROS  negative psych ROS   GI/Hepatic negative GI ROS, Neg liver ROS, GERD  Medicated,  Endo/Other  negative endocrine ROS  Renal/GU negative Renal ROS  negative genitourinary   Musculoskeletal   Abdominal   Peds  Hematology negative hematology ROS (+)   Anesthesia Other Findings   Reproductive/Obstetrics negative OB ROS                             Anesthesia Physical Anesthesia Plan  ASA: II  Anesthesia Plan: General LMA   Post-op Pain Management:    Induction:   PONV Risk Score and Plan:   Airway Management Planned:   Additional Equipment:   Intra-op Plan:   Post-operative Plan:   Informed Consent: I have reviewed the patients History and Physical, chart, labs and discussed the procedure including the risks, benefits and alternatives for the proposed anesthesia with the patient or authorized representative who has indicated his/her understanding and acceptance.     Plan Discussed with: CRNA  Anesthesia Plan Comments:         Anesthesia Quick Evaluation

## 2017-04-06 NOTE — Op Note (Signed)
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   ASSISTANT: none  ANESTHESIA: general  ESTIMATED BLOOD LOSS: Minimal  FLUIDS REPLACED: 800 mL of crystalloid  TOURNIQUET TIME: Not used   DRAINS: none  IMPLANTS UTILIZED: None  INDICATIONS FOR SURGERY: JANAVIA ROTTMAN is a 51 y.o. year old female who has been seen for complaints of right knee pain. MRI demonstrated findings consistent with meniscal pathology. After discussion of the risks and benefits of surgical intervention, the patient expressed understanding of the risks benefits and agree with plans for right knee arthroscopy.   PROCEDURE IN DETAIL: The patient was brought into the operating room and, after adequate general anesthesia was achieved, a tourniquet was applied to the right thigh and the leg was placed in the leg holder. All bony prominences were well padded. The patient's right knee was cleaned and prepped with alcohol and Duraprep and draped in the usual sterile fashion. A "timeout" was performed as per usual protocol. The anticipated portal sites were injected with 0.25% Marcaine with epinephrine. An anterolateral incision was made and a cannula was inserted. A moderate effusion was evacuated and the knee was distended with fluid using the pump. The scope was advanced down the medial gutter into the medial compartment. Under visualization with the scope, an anteromedial portal was created and a hooked probe was inserted. The medial meniscus was visualized and probed. There was a complex tear of the posterior  horn of the medial meniscus. The tear was debrided using meniscal punches and the ArthroCare wand. Final contouring was performed using the ArthroCare wand. The articular cartilage was visualized. There were grade 2-3 changes of chondromalacia involving primarily the medial femoral condyle as well as some localized areas to the medial tibial plateau. These areas were debrided and contoured using the ArthroCare wand.  The scope was then advanced into the intercondylar notch. The anterior cruciate ligament was visualized and probed and felt to be intact. The scope was removed from the lateral portal and reinserted via the anteromedial portal to better visualize the lateral compartment. The lateral meniscus was visualized and probed. The lateral meniscus was stable without evidence of tear or instability. The articular cartilage of the lateral compartment was visualized and noted to be in excellent condition. Finally, the scope was advanced so as to visualize the patellofemoral articulation. Good patellar tracking was appreciated. The articular surface was in good condition.  The knee was irrigated with copius amounts of fluid and suctioned dry. The anterolateral portal was re-approximated with #3-0 nylon. A combination of 0.25% Marcaine with epinephrine and 4 mg of Morphine were injected via the scope. The scope was removed and the anteromedial portal was re-approximated with #3-0 nylon. A sterile dressing was applied followed by application of an ice wrap.  The patient tolerated the procedure well and was transported to the PACU in stable condition.  Aarilyn Dye P. Holley Bouche., M.D.

## 2017-04-06 NOTE — Discharge Instructions (Signed)
  Instructions after Knee Arthroscopy    James P. Hooten, Jr., M.D.     Dept. of Orthopaedics & Sports Medicine  [facility]  [address]  [place], [place]  [address]   Phone: [phone]   Fax: [phone]   DIET: . Drink plenty of non-alcoholic fluids & begin a light diet. . Resume your normal diet the day after surgery.  ACTIVITY:  . You may use crutches or a walker with weight-bearing as tolerated, unless instructed otherwise. . You may wean yourself off of the walker or crutches as tolerated.  . Begin doing gentle exercises. Exercising will reduce the pain and swelling, increase motion, and prevent muscle weakness.   . Avoid strenuous activities or athletics for a minimum of 4-6 weeks after arthroscopic surgery. . Do not drive or operate any equipment until instructed.  WOUND CARE:  . Place one to two pillows under the knee the first day or two when sitting or lying.  . Continue to use the ice packs periodically to reduce pain and swelling. . The small incisions in your knee are closed with nylon stitches. The stitches will be removed in the office. . The bulky dressing may be removed on the second day after surgery. DO NOT TOUCH THE STITCHES. Put a Band-Aid over each stitch. Do NOT use any ointments or creams on the incisions.  . You may bathe or shower after the stitches are removed at the first office visit following surgery.  MEDICATIONS: . You may resume your regular medications. . Please take the pain medication as prescribed. . Do not take pain medication on an empty stomach. . Do not drive or drink alcoholic beverages when taking pain medications.  CALL THE OFFICE FOR: . Temperature above 101 degrees . Excessive bleeding or drainage on the dressing. . Excessive swelling, coldness, or paleness of the toes. . Persistent nausea and vomiting.  FOLLOW-UP:  . You should have an appointment to return to the office in 7-10 days after surgery.   AMBULATORY  SURGERY  DISCHARGE INSTRUCTIONS   1) The drugs that you were given will stay in your system until tomorrow so for the next 24 hours you should not:  A) Drive an automobile B) Make any legal decisions C) Drink any alcoholic beverage   2) You may resume regular meals tomorrow.  Today it is better to start with liquids and gradually work up to solid foods.  You may eat anything you prefer, but it is better to start with liquids, then soup and crackers, and gradually work up to solid foods.   3) Please notify your doctor immediately if you have any unusual bleeding, trouble breathing, redness and pain at the surgery site, drainage, fever, or pain not relieved by medication.    4) Additional Instructions: TAKE A STOOL SOFTENER TWICE A DAY WHILE TAKING NARCOTIC PAIN MEDICINE TO PREVENT CONSTIPATION   Please contact your physician with any problems or Same Day Surgery at [phone], Monday through Friday 6 am to 4 pm, or [place] [facility] [employer] Main number at [phone].     

## 2017-04-06 NOTE — Anesthesia Procedure Notes (Signed)
Procedure Name: LMA Insertion Date/Time: 04/06/2017 11:53 AM Performed by: Rosaria Ferries, Marca Gadsby Pre-anesthesia Checklist: Patient identified, Emergency Drugs available, Suction available and Patient being monitored Patient Re-evaluated:Patient Re-evaluated prior to induction Oxygen Delivery Method: Circle system utilized Preoxygenation: Pre-oxygenation with 100% oxygen Induction Type: IV induction LMA: LMA inserted LMA Size: 4.0 Number of attempts: 1 Placement Confirmation: breath sounds checked- equal and bilateral and positive ETCO2 Dental Injury: Teeth and Oropharynx as per pre-operative assessment

## 2017-04-06 NOTE — Transfer of Care (Signed)
Immediate Anesthesia Transfer of Care Note  Patient: Amanda Day  Procedure(s) Performed: Procedure(s): ARTHROSCOPY KNEE, PARTIAL MEDIAL MENISCECTOMY, MEDIAL CHONDROPLASTY (Right)  Patient Location: PACU  Anesthesia Type:General  Level of Consciousness: awake and patient cooperative  Airway & Oxygen Therapy: Patient Spontanous Breathing and Patient connected to face mask oxygen  Post-op Assessment: Report given to RN and Post -op Vital signs reviewed and stable  Post vital signs: Reviewed and stable  Last Vitals:  Vitals:   04/06/17 1000  BP: 133/86  Pulse: 79  Resp: 16  Temp: 36.7 C  SpO2: 100%    Last Pain:  Vitals:   04/06/17 1000  PainSc: 5       Patients Stated Pain Goal: 3 (19/41/74 0814)  Complications: No apparent anesthesia complications

## 2017-04-06 NOTE — Anesthesia Post-op Follow-up Note (Signed)
Anesthesia QCDR form completed.        

## 2017-04-06 NOTE — H&P (Signed)
The patient has been re-examined, and the chart reviewed, and there have been no interval changes to the documented history and physical.    The risks, benefits, and alternatives have been discussed at length. The patient expressed understanding of the risks benefits and agreed with plans for surgical intervention.  Janisa Labus P. Candelario Steppe, Jr. M.D.    

## 2017-04-14 NOTE — Anesthesia Postprocedure Evaluation (Signed)
Anesthesia Post Note  Patient: ELIANA LUETH  Procedure(s) Performed: Procedure(s) (LRB): ARTHROSCOPY KNEE, PARTIAL MEDIAL MENISCECTOMY, MEDIAL CHONDROPLASTY (Right)  Patient location during evaluation: PACU Anesthesia Type: General Level of consciousness: awake and alert Pain management: pain level controlled Vital Signs Assessment: post-procedure vital signs reviewed and stable Respiratory status: spontaneous breathing, nonlabored ventilation, respiratory function stable and patient connected to nasal cannula oxygen Cardiovascular status: blood pressure returned to baseline and stable Postop Assessment: no signs of nausea or vomiting Anesthetic complications: no     Last Vitals:  Vitals:   04/06/17 1434 04/06/17 1505  BP: (!) 154/77 (!) 155/94  Pulse: 69 73  Resp: 14 16  Temp:  36.4 C  SpO2: 100% 100%    Last Pain:  Vitals:   04/06/17 1505  TempSrc: Temporal  PainSc:                  Molli Barrows

## 2017-05-09 ENCOUNTER — Other Ambulatory Visit: Payer: Self-pay | Admitting: Physician Assistant

## 2017-05-10 NOTE — Telephone Encounter (Signed)
Please review for [employer], RMA

## 2017-06-07 ENCOUNTER — Other Ambulatory Visit: Payer: Self-pay | Admitting: Physician Assistant

## 2017-06-07 DIAGNOSIS — I10 Essential (primary) hypertension: Secondary | ICD-10-CM

## 2017-06-15 LAB — HM MAMMOGRAPHY

## 2017-06-28 ENCOUNTER — Encounter: Payer: Self-pay | Admitting: Physician Assistant

## 2017-06-30 ENCOUNTER — Ambulatory Visit: Payer: BC Managed Care – PPO | Admitting: Physician Assistant

## 2017-06-30 ENCOUNTER — Encounter: Payer: Self-pay | Admitting: Physician Assistant

## 2017-06-30 VITALS — BP 160/110 | HR 71 | Temp 98.2°F | Resp 16 | Wt 211.0 lb

## 2017-06-30 DIAGNOSIS — I10 Essential (primary) hypertension: Secondary | ICD-10-CM

## 2017-06-30 MED ORDER — AMLODIPINE BESYLATE 5 MG PO TABS: 5.0000 mg | ORAL_TABLET | Freq: Every day | ORAL | 1 refills | Status: DC

## 2017-06-30 NOTE — Progress Notes (Signed)
Patient: Amanda Day Female    DOB: March 07, 1966   51 y.o.   MRN: [id number] Visit Date: 06/30/2017  Today's Provider: Mar Daring, PA-C   No chief complaint on file.  Subjective:    HPI Patient  Here today for BP concerns. She reports that her BP readings with PT on Tuesday of this week 165/110. Last week BP was 155/104. She reports she has not had her BP checked in a while and has not felt a need to. She has been on metoprolol for 2-3 years per patient. Does have strong family history of HTN. Reports eating a healthy, low sodium diet. Does report that she has recently put on about 20 pounds since hurting her knees and not being able to exercise. She does have an appt next week with a nutritionist.     No Known Allergies   Current Outpatient Medications:  .  Ergocalciferol (VITAMIN D2) 2000 UNITS TABS, Take 2,000 Units by mouth every evening. , Disp: , Rfl:  .  fluticasone (FLONASE) 50 MCG/ACT nasal spray, INSTILL 2 SPRAYS IN EACH NOSTRIL DAILY AS NEEDED (Patient taking differently: INSTILL 2 SPRAYS IN EACH NOSTRIL ONCE DAILY), Disp: 48 g, Rfl: 1 .  ibuprofen (ADVIL,MOTRIN) 200 MG tablet, Take 400 mg by mouth 3 (three) times daily as needed for headache or mild pain. , Disp: , Rfl:  .  loratadine (CLARITIN) 10 MG tablet, Take 10 mg by mouth daily. , Disp: , Rfl:  .  metoprolol succinate (TOPROL-XL) 50 MG 24 hr tablet, TAKE 1 TABLET BY MOUTH EVERY DAY, Disp: 90 tablet, Rfl: 1 .  montelukast (SINGULAIR) 10 MG tablet, TAKE 1 TABLET(10 MG) BY MOUTH AT BEDTIME, Disp: 30 tablet, Rfl: 11 .  omeprazole (PRILOSEC OTC) 20 MG tablet, Take 20 mg by mouth daily as needed (acid reflux)., Disp: , Rfl:  .  sertraline (ZOLOFT) 50 MG tablet, TAKE 1/2 TO 1 TABLET BY MOUTH EVERY DAY, Disp: 30 tablet, Rfl: 0 .  HYDROcodone-acetaminophen (NORCO) 5-325 MG tablet, Take 1-2 tablets by mouth every 4 (four) hours as needed for moderate pain. (Patient not taking: Reported on 06/30/2017), Disp: 30  tablet, Rfl: 0  Review of Systems  Constitutional: Positive for activity change (due to knee injury) and unexpected weight change (gaining).  HENT: Positive for congestion.   Eyes: Negative for visual disturbance.  Respiratory: Negative for cough, chest tightness and shortness of breath.   Cardiovascular: Negative for chest pain, palpitations and leg swelling.  Neurological: Positive for headaches. Negative for dizziness and light-headedness.    Social History   Tobacco Use  . Smoking status: Never Smoker  . Smokeless tobacco: Never Used  Substance Use Topics  . Alcohol use: Yes    Comment: 4 nights a week, wine or beer   Objective:   BP (!) 160/110 (BP Location: Left Arm, Patient Position: Sitting, Cuff Size: Normal)   Pulse 71   Temp 98.2 F (36.8 C) (Oral)   Resp 16   Wt 211 lb (95.7 kg)   LMP 03/27/2016 (Approximate)   SpO2 99%   BMI 32.08 kg/m  Vitals:   06/30/17 1619  BP: (!) 160/110  Pulse: 71  Resp: 16  Temp: 98.2 F (36.8 C)  TempSrc: Oral  SpO2: 99%  Weight: 211 lb (95.7 kg)     Physical Exam  Constitutional: She appears well-developed and well-nourished. No distress.  Neck: Normal range of motion. Neck supple. No JVD present. No tracheal deviation present. No  thyromegaly present.  Cardiovascular: Normal rate, regular rhythm and normal heart sounds. Exam reveals no gallop and no friction rub.  No murmur heard. Pulmonary/Chest: Effort normal and breath sounds normal. No respiratory distress. She has no wheezes. She has no rales.  Musculoskeletal: She exhibits no edema.  Lymphadenopathy:    She has no cervical adenopathy.  Skin: She is not diaphoretic.  Vitals reviewed.       Assessment & Plan:     1. Essential hypertension Will add amlodipine 5mg  as below. Continue metoprolol 50mg . DASH diet. I will see her back in 4 weeks to recheck and see how she is tolerating medications. - amLODipine (NORVASC) 5 MG tablet; Take 1 tablet (5 mg total) daily  by mouth.  Dispense: 30 tablet; Refill: [place], PA-C  [facility]

## 2017-06-30 NOTE — Patient Instructions (Signed)
Amlodipine tablets What is this medicine? AMLODIPINE (am LOE di peen) is a calcium-channel blocker. It affects the amount of calcium found in your heart and muscle cells. This relaxes your blood vessels, which can reduce the amount of work the heart has to do. This medicine is used to lower high blood pressure. It is also used to prevent chest pain. This medicine may be used for other purposes; ask your health care provider or pharmacist if you have questions. COMMON BRAND NAME(S): Norvasc What should I tell my health care provider before I take this medicine? They need to know if you have any of these conditions: -heart problems like heart failure or aortic stenosis -liver disease -an unusual or allergic reaction to amlodipine, other medicines, foods, dyes, or preservatives -pregnant or trying to get pregnant -breast-feeding How should I use this medicine? Take this medicine by mouth with a glass of water. Follow the directions on the prescription label. Take your medicine at regular intervals. Do not take more medicine than directed. Talk to your pediatrician regarding the use of this medicine in children. Special care may be needed. This medicine has been used in children as young as 6. Persons over 26 years old may have a stronger reaction to this medicine and need smaller doses. Overdosage: If you think you have taken too much of this medicine contact a poison control center or emergency room at once. NOTE: This medicine is only for you. Do not share this medicine with others. What if I miss a dose? If you miss a dose, take it as soon as you can. If it is almost time for your next dose, take only that dose. Do not take double or extra doses. What may interact with this medicine? -herbal or dietary supplements -local or general anesthetics -medicines for high blood pressure -medicines for prostate problems -rifampin This list may not describe all possible interactions. Give your health  care provider a list of all the medicines, herbs, non-prescription drugs, or dietary supplements you use. Also tell them if you smoke, drink alcohol, or use illegal drugs. Some items may interact with your medicine. What should I watch for while using this medicine? Visit your doctor or health care professional for regular check ups. Check your blood pressure and pulse rate regularly. Ask your health care professional what your blood pressure and pulse rate should be, and when you should contact him or her. This medicine may make you feel confused, dizzy or lightheaded. Do not drive, use machinery, or do anything that needs mental alertness until you know how this medicine affects you. To reduce the risk of dizzy or fainting spells, do not sit or stand up quickly, especially if you are an older patient. Avoid alcoholic drinks; they can make you more dizzy. Do not suddenly stop taking amlodipine. Ask your doctor or health care professional how you can gradually reduce the dose. What side effects may I notice from receiving this medicine? Side effects that you should report to your doctor or health care professional as soon as possible: -allergic reactions like skin rash, itching or hives, swelling of the face, lips, or tongue -breathing problems -changes in vision or hearing -chest pain -fast, irregular heartbeat -swelling of legs or ankles Side effects that usually do not require medical attention (report to your doctor or health care professional if they continue or are bothersome): -dry mouth -facial flushing -nausea, vomiting -stomach gas, pain -tired, weak -trouble sleeping This list may not describe all possible side  effects. Call your doctor for medical advice about side effects. You may report side effects to FDA at 1-800-FDA-1088. °Where should I keep my medicine? °Keep out of the reach of children. °Store at room temperature between 59 and 86 degrees F (15 and 30 degrees C). Protect from  light. Keep container tightly closed. Throw away any unused medicine after the expiration date. °NOTE: This sheet is a summary. It may not cover all possible information. If you have questions about this medicine, talk to your doctor, pharmacist, or health care provider. °© 2018 [employer]/Gold Standard (2012-07-07 11:40:58) ° °

## 2017-07-05 ENCOUNTER — Other Ambulatory Visit: Payer: Self-pay | Admitting: Physician Assistant

## 2017-07-08 ENCOUNTER — Other Ambulatory Visit: Payer: Self-pay | Admitting: Family Medicine

## 2017-07-08 DIAGNOSIS — J309 Allergic rhinitis, unspecified: Secondary | ICD-10-CM

## 2017-07-08 NOTE — Telephone Encounter (Signed)
See refill request.

## 2017-07-28 ENCOUNTER — Ambulatory Visit: Payer: BC Managed Care – PPO | Admitting: Physician Assistant

## 2017-07-28 ENCOUNTER — Encounter: Payer: Self-pay | Admitting: Physician Assistant

## 2017-07-28 VITALS — BP 142/90 | HR 78 | Temp 97.9°F | Resp 16 | Wt 213.6 lb

## 2017-07-28 DIAGNOSIS — Z23 Encounter for immunization: Secondary | ICD-10-CM

## 2017-07-28 DIAGNOSIS — I10 Essential (primary) hypertension: Secondary | ICD-10-CM

## 2017-07-28 NOTE — Progress Notes (Signed)
Patient: Amanda Day Female    DOB: 1966/06/12   51 y.o.   MRN: [id number] Visit Date: 07/28/2017  Today's Provider: Mar Daring, PA-C   Chief Complaint  Patient presents with  . Follow-up    Hypertension   Subjective:    HPI    Hypertension, follow-up:  BP Readings from Last 3 Encounters:  07/28/17 (!) 142/90  06/30/17 (!) 160/110  04/06/17 (!) 155/94    She was last seen for hypertension 4 weeks ago.  BP at that visit was 160/110 Management since that visit includes Amlodipine 5 mg was added.Continue Metoprolol 50mg . DASH diet. She reports excellent compliance with treatment. She is not having side effects.  She is exercising. Limiting exercising because of her knee. She is adherent to low salt diet.   Outside blood pressures are n/a She is experiencing none.  Patient denies chest pain, chest pressure/discomfort, claudication, exertional chest pressure/discomfort, fatigue, irregular heart beat, lower extremity edema, near-syncope and palpitations.   Cardiovascular risk factors include hypertension.      Weight trend: stable Wt Readings from Last 3 Encounters:  07/28/17 213 lb 9.6 oz (96.9 kg)  06/30/17 211 lb (95.7 kg)  04/06/17 210 lb (95.3 kg)    Current diet: in general, a "healthy" diet    ------------------------------------------------------------------------     No Known Allergies   Current Outpatient Medications:  .  amLODipine (NORVASC) 5 MG tablet, Take 1 tablet (5 mg total) daily by mouth., Disp: 30 tablet, Rfl: 1 .  Ergocalciferol (VITAMIN D2) 2000 UNITS TABS, Take 2,000 Units by mouth every evening. , Disp: , Rfl:  .  fluticasone (FLONASE) 50 MCG/ACT nasal spray, SHAKE LIQUID AND USE 2 SPRAYS IN EACH NOSTRIL DAILY AS NEEDED, Disp: 16 g, Rfl: 0 .  ibuprofen (ADVIL,MOTRIN) 200 MG tablet, Take 400 mg by mouth 3 (three) times daily as needed for headache or mild pain. , Disp: , Rfl:  .  loratadine (CLARITIN) 10 MG tablet,  Take 10 mg by mouth daily. , Disp: , Rfl:  .  metoprolol succinate (TOPROL-XL) 50 MG 24 hr tablet, TAKE 1 TABLET BY MOUTH EVERY DAY, Disp: 90 tablet, Rfl: 1 .  montelukast (SINGULAIR) 10 MG tablet, TAKE 1 TABLET(10 MG) BY MOUTH AT BEDTIME, Disp: 30 tablet, Rfl: 11 .  omeprazole (PRILOSEC OTC) 20 MG tablet, Take 20 mg by mouth daily as needed (acid reflux)., Disp: , Rfl:  .  sertraline (ZOLOFT) 50 MG tablet, TAKE 1/2 TO 1 TABLET BY MOUTH EVERY DAY, Disp: 90 tablet, Rfl: 1 .  HYDROcodone-acetaminophen (NORCO) 5-325 MG tablet, Take 1-2 tablets by mouth every 4 (four) hours as needed for moderate pain. (Patient not taking: Reported on 06/30/2017), Disp: 30 tablet, Rfl: 0  Review of Systems  Constitutional: Negative for fatigue.  Eyes: Negative for visual disturbance.  Cardiovascular: Negative for chest pain, palpitations and leg swelling.  Musculoskeletal: Positive for arthralgias (left knee).  Neurological: Negative for dizziness, light-headedness and headaches.    Social History   Tobacco Use  . Smoking status: Never Smoker  . Smokeless tobacco: Never Used  Substance Use Topics  . Alcohol use: Yes    Comment: 4 nights a week, wine or beer   Objective:   BP (!) 142/90 (BP Location: Left Arm, Patient Position: Sitting, Cuff Size: Normal)   Pulse 78   Temp 97.9 F (36.6 C) (Oral)   Resp 16   Wt 213 lb 9.6 oz (96.9 kg)   LMP 03/27/2016 (Approximate)  BMI 32.48 kg/m  Vitals:   07/28/17 0843  BP: (!) 142/90  Pulse: 78  Resp: 16  Temp: 97.9 F (36.6 C)  TempSrc: Oral  Weight: 213 lb 9.6 oz (96.9 kg)     Physical Exam  Constitutional: She appears well-developed and well-nourished. No distress.  Neck: Normal range of motion. Neck supple. No JVD present. No tracheal deviation present. No thyromegaly present.  Cardiovascular: Normal rate, regular rhythm and normal heart sounds. Exam reveals no gallop and no friction rub.  No murmur heard. Pulmonary/Chest: Effort normal and  breath sounds normal. No respiratory distress. She has no wheezes. She has no rales.  Lymphadenopathy:    She has no cervical adenopathy.  Skin: She is not diaphoretic.  Vitals reviewed.     Assessment & Plan:     1. Essential (primary) hypertension Still borderline elevated but possibly due to pain of left knee. Will continue to monitor. She is to call with at home readings and if still elevated we will either increase metoprolol or possibly increase amlodipine. If increased I will see her back in 4-6 weeks following the time I increase.   2. Need for influenza vaccination Flu vaccine given today without complication. Patient sat upright for 15 minutes to check for adverse reaction before being released. - Flu Vaccine QUAD 36+ mos IM       Mar Daring, PA-C  [facility]

## 2017-07-28 NOTE — Patient Instructions (Signed)
DASH Eating Plan DASH stands for "Dietary Approaches to Stop Hypertension." The DASH eating plan is a healthy eating plan that has been shown to reduce high blood pressure (hypertension). It may also reduce your risk for type 2 diabetes, heart disease, and stroke. The DASH eating plan may also help with weight loss. What are tips for following this plan? General guidelines  Avoid eating more than 2,300 mg (milligrams) of salt (sodium) a day. If you have hypertension, you may need to reduce your sodium intake to 1,500 mg a day.  Limit alcohol intake to no more than 1 drink a day for nonpregnant women and 2 drinks a day for men. One drink equals 12 oz of beer, 5 oz of wine, or 1 oz of hard liquor.  Work with your health care provider to maintain a healthy body weight or to lose weight. Ask what an ideal weight is for you.  Get at least 30 minutes of exercise that causes your heart to beat faster (aerobic exercise) most days of the week. Activities may include walking, swimming, or biking.  Work with your health care provider or diet and nutrition specialist (dietitian) to adjust your eating plan to your individual calorie needs. Reading food labels  Check food labels for the amount of sodium per serving. Choose foods with less than 5 percent of the Daily Value of sodium. Generally, foods with less than 300 mg of sodium per serving fit into this eating plan.  To find whole grains, look for the word "whole" as the first word in the ingredient list. Shopping  Buy products labeled as "low-sodium" or "no salt added."  Buy fresh foods. Avoid canned foods and premade or frozen meals. Cooking  Avoid adding salt when cooking. Use salt-free seasonings or herbs instead of table salt or sea salt. Check with your health care provider or pharmacist before using salt substitutes.  Do not fry foods. Cook foods using healthy methods such as baking, boiling, grilling, and broiling instead.  Cook with  heart-healthy oils, such as olive, canola, soybean, or sunflower oil. Meal planning   Eat a balanced diet that includes: ? 5 or more servings of fruits and vegetables each day. At each meal, try to fill half of your plate with fruits and vegetables. ? Up to 6-8 servings of whole grains each day. ? Less than 6 oz of lean meat, poultry, or fish each day. A 3-oz serving of meat is about the same size as a deck of cards. One egg equals 1 oz. ? 2 servings of low-fat dairy each day. ? A serving of nuts, seeds, or beans 5 times each week. ? Heart-healthy fats. Healthy fats called Omega-3 fatty acids are found in foods such as flaxseeds and coldwater fish, like sardines, salmon, and mackerel.  Limit how much you eat of the following: ? Canned or prepackaged foods. ? Food that is high in trans fat, such as fried foods. ? Food that is high in saturated fat, such as fatty meat. ? Sweets, desserts, sugary drinks, and other foods with added sugar. ? Full-fat dairy products.  Do not salt foods before eating.  Try to eat at least 2 vegetarian meals each week.  Eat more home-cooked food and less restaurant, buffet, and fast food.  When eating at a restaurant, ask that your food be prepared with less salt or no salt, if possible. What foods are recommended? The items listed may not be a complete list. Talk with your dietitian about what   dietary choices are best for you. Grains Whole-grain or whole-wheat bread. Whole-grain or whole-wheat pasta. Brown rice. Oatmeal. Quinoa. Bulgur. Whole-grain and low-sodium cereals. Pita bread. Low-fat, low-sodium crackers. Whole-wheat flour tortillas. Vegetables Fresh or frozen vegetables (raw, steamed, roasted, or grilled). Low-sodium or reduced-sodium tomato and vegetable juice. Low-sodium or reduced-sodium tomato sauce and tomato paste. Low-sodium or reduced-sodium canned vegetables. Fruits All fresh, dried, or frozen fruit. Canned fruit in natural juice (without  added sugar). Meat and other protein foods Skinless chicken or turkey. Ground chicken or turkey. Pork with fat trimmed off. Fish and seafood. Egg whites. Dried beans, peas, or lentils. Unsalted nuts, nut butters, and seeds. Unsalted canned beans. Lean cuts of beef with fat trimmed off. Low-sodium, lean deli meat. Dairy Low-fat (1%) or fat-free (skim) milk. Fat-free, low-fat, or reduced-fat cheeses. Nonfat, low-sodium ricotta or cottage cheese. Low-fat or nonfat yogurt. Low-fat, low-sodium cheese. Fats and oils Soft margarine without trans fats. Vegetable oil. Low-fat, reduced-fat, or light mayonnaise and salad dressings (reduced-sodium). Canola, safflower, olive, soybean, and sunflower oils. Avocado. Seasoning and other foods Herbs. Spices. Seasoning mixes without salt. Unsalted popcorn and pretzels. Fat-free sweets. What foods are not recommended? The items listed may not be a complete list. Talk with your dietitian about what dietary choices are best for you. Grains Baked goods made with fat, such as croissants, muffins, or some breads. Dry pasta or rice meal packs. Vegetables Creamed or fried vegetables. Vegetables in a cheese sauce. Regular canned vegetables (not low-sodium or reduced-sodium). Regular canned tomato sauce and paste (not low-sodium or reduced-sodium). Regular tomato and vegetable juice (not low-sodium or reduced-sodium). Pickles. Olives. Fruits Canned fruit in a light or heavy syrup. Fried fruit. Fruit in cream or butter sauce. Meat and other protein foods Fatty cuts of meat. Ribs. Fried meat. Bacon. Sausage. Bologna and other processed lunch meats. Salami. Fatback. Hotdogs. Bratwurst. Salted nuts and seeds. Canned beans with added salt. Canned or smoked fish. Whole eggs or egg yolks. Chicken or turkey with skin. Dairy Whole or 2% milk, cream, and half-and-half. Whole or full-fat cream cheese. Whole-fat or sweetened yogurt. Full-fat cheese. Nondairy creamers. Whipped toppings.  Processed cheese and cheese spreads. Fats and oils Butter. Stick margarine. Lard. Shortening. Ghee. Bacon fat. Tropical oils, such as coconut, palm kernel, or palm oil. Seasoning and other foods Salted popcorn and pretzels. Onion salt, garlic salt, seasoned salt, table salt, and sea salt. Worcestershire sauce. Tartar sauce. Barbecue sauce. Teriyaki sauce. Soy sauce, including reduced-sodium. Steak sauce. Canned and packaged gravies. Fish sauce. Oyster sauce. Cocktail sauce. Horseradish that you find on the shelf. Ketchup. Mustard. Meat flavorings and tenderizers. Bouillon cubes. Hot sauce and Tabasco sauce. Premade or packaged marinades. Premade or packaged taco seasonings. Relishes. Regular salad dressings. Where to find more information:  National Heart, Lung, and Blood Institute: www.nhlbi.nih.gov  American Heart Association: www.heart.org Summary  The DASH eating plan is a healthy eating plan that has been shown to reduce high blood pressure (hypertension). It may also reduce your risk for type 2 diabetes, heart disease, and stroke.  With the DASH eating plan, you should limit salt (sodium) intake to 2,300 mg a day. If you have hypertension, you may need to reduce your sodium intake to 1,500 mg a day.  When on the DASH eating plan, aim to eat more fresh fruits and vegetables, whole grains, lean proteins, low-fat dairy, and heart-healthy fats.  Work with your health care provider or diet and nutrition specialist (dietitian) to adjust your eating plan to your individual   calorie needs. This information is not intended to replace advice given to you by your health care provider. Make sure you discuss any questions you have with your health care provider. Document Released: 07/29/2011 Document Revised: 08/02/2016 Document Reviewed: 08/02/2016 Elsevier Interactive Patient Education  2017 [employer] Inc.  

## 2017-08-04 ENCOUNTER — Other Ambulatory Visit: Payer: Self-pay | Admitting: Chiropractic Medicine

## 2017-08-04 DIAGNOSIS — M25562 Pain in left knee: Secondary | ICD-10-CM

## 2017-08-06 ENCOUNTER — Other Ambulatory Visit: Payer: Self-pay | Admitting: Physician Assistant

## 2017-08-06 DIAGNOSIS — J309 Allergic rhinitis, unspecified: Secondary | ICD-10-CM

## 2017-08-08 NOTE — Telephone Encounter (Signed)
Pharmacy requesting refills. Thanks!  

## 2017-08-10 ENCOUNTER — Ambulatory Visit
Admission: RE | Admit: 2017-08-10 | Discharge: 2017-08-10 | Disposition: A | Discharge status: Home / Self Care | Payer: BC Managed Care – PPO | Source: Ambulatory Visit | Attending: Chiropractic Medicine | Admitting: Chiropractic Medicine

## 2017-08-10 ENCOUNTER — Other Ambulatory Visit: Payer: BC Managed Care – PPO

## 2017-08-10 DIAGNOSIS — M25562 Pain in left knee: Secondary | ICD-10-CM

## 2017-08-19 ENCOUNTER — Encounter: Payer: Self-pay | Admitting: Physician Assistant

## 2017-08-19 DIAGNOSIS — I10 Essential (primary) hypertension: Secondary | ICD-10-CM

## 2017-08-22 MED ORDER — AMLODIPINE BESYLATE 10 MG PO TABS: 10.0000 mg | ORAL_TABLET | Freq: Every day | ORAL | 1 refills | Status: DC

## 2017-08-26 ENCOUNTER — Other Ambulatory Visit: Payer: Self-pay | Admitting: Physician Assistant

## 2017-08-26 DIAGNOSIS — I10 Essential (primary) hypertension: Secondary | ICD-10-CM

## 2017-09-22 ENCOUNTER — Other Ambulatory Visit: Payer: Self-pay | Admitting: Physician Assistant

## 2017-09-22 DIAGNOSIS — J309 Allergic rhinitis, unspecified: Secondary | ICD-10-CM

## 2017-12-02 ENCOUNTER — Telehealth: Payer: Self-pay | Admitting: Physician Assistant

## 2017-12-02 ENCOUNTER — Other Ambulatory Visit: Payer: Self-pay | Admitting: Physician Assistant

## 2017-12-02 DIAGNOSIS — I10 Essential (primary) hypertension: Secondary | ICD-10-CM

## 2017-12-02 MED ORDER — METOPROLOL SUCCINATE ER 50 MG PO TB24: 50.0000 mg | ORAL_TABLET | Freq: Every day | ORAL | 0 refills | Status: DC

## 2017-12-02 NOTE — Telephone Encounter (Signed)
[facility] pharmacy calling for instruction on pt's metoprolol.  They need more instructions   Their call back is [phone]  Thanks Con Memos

## 2017-12-02 NOTE — Telephone Encounter (Signed)
Resent prescription with sig

## 2018-02-03 ENCOUNTER — Other Ambulatory Visit: Payer: Self-pay | Admitting: Physician Assistant

## 2018-02-03 DIAGNOSIS — J309 Allergic rhinitis, unspecified: Secondary | ICD-10-CM

## 2018-02-03 DIAGNOSIS — F325 Major depressive disorder, single episode, in full remission: Secondary | ICD-10-CM

## 2018-02-15 ENCOUNTER — Other Ambulatory Visit: Payer: Self-pay | Admitting: Physician Assistant

## 2018-02-15 DIAGNOSIS — I10 Essential (primary) hypertension: Secondary | ICD-10-CM

## 2018-03-22 ENCOUNTER — Other Ambulatory Visit: Payer: Self-pay | Admitting: Physician Assistant

## 2018-03-22 DIAGNOSIS — J309 Allergic rhinitis, unspecified: Secondary | ICD-10-CM

## 2018-06-07 ENCOUNTER — Ambulatory Visit (INDEPENDENT_AMBULATORY_CARE_PROVIDER_SITE_OTHER): Payer: BC Managed Care – PPO | Admitting: Physician Assistant

## 2018-06-07 ENCOUNTER — Encounter: Payer: Self-pay | Admitting: Physician Assistant

## 2018-06-07 VITALS — BP 140/80 | HR 78 | Temp 98.4°F | Resp 16 | Ht 68.0 in | Wt 211.8 lb

## 2018-06-07 DIAGNOSIS — Z13220 Encounter for screening for lipoid disorders: Secondary | ICD-10-CM | POA: Diagnosis not present

## 2018-06-07 DIAGNOSIS — Z131 Encounter for screening for diabetes mellitus: Secondary | ICD-10-CM | POA: Diagnosis not present

## 2018-06-07 DIAGNOSIS — Z0000 Encounter for general adult medical examination without abnormal findings: Secondary | ICD-10-CM

## 2018-06-07 DIAGNOSIS — N950 Postmenopausal bleeding: Secondary | ICD-10-CM

## 2018-06-07 DIAGNOSIS — Z124 Encounter for screening for malignant neoplasm of cervix: Secondary | ICD-10-CM | POA: Diagnosis not present

## 2018-06-07 DIAGNOSIS — Z23 Encounter for immunization: Secondary | ICD-10-CM

## 2018-06-07 DIAGNOSIS — Z136 Encounter for screening for cardiovascular disorders: Secondary | ICD-10-CM

## 2018-06-07 DIAGNOSIS — Z6832 Body mass index (BMI) 32.0-32.9, adult: Secondary | ICD-10-CM

## 2018-06-07 DIAGNOSIS — Z114 Encounter for screening for human immunodeficiency virus [HIV]: Secondary | ICD-10-CM

## 2018-06-07 DIAGNOSIS — H9193 Unspecified hearing loss, bilateral: Secondary | ICD-10-CM

## 2018-06-07 NOTE — Patient Instructions (Signed)
Health Maintenance for Postmenopausal Women Menopause is a normal process in which your reproductive ability comes to an end. This process happens gradually over a span of months to years, usually between the ages of 22 and 9. Menopause is complete when you have missed 12 consecutive menstrual periods. It is important to talk with your health care provider about some of the most common conditions that affect postmenopausal women, such as heart disease, cancer, and bone loss (osteoporosis). Adopting a healthy lifestyle and getting preventive care can help to promote your health and wellness. Those actions can also lower your chances of developing some of these common conditions. What should I know about menopause? During menopause, you may experience a number of symptoms, such as:  Moderate-to-severe hot flashes.  Night sweats.  Decrease in sex drive.  Mood swings.  Headaches.  Tiredness.  Irritability.  Memory problems.  Insomnia.  Choosing to treat or not to treat menopausal changes is an individual decision that you make with your health care provider. What should I know about hormone replacement therapy and supplements? Hormone therapy products are effective for treating symptoms that are associated with menopause, such as hot flashes and night sweats. Hormone replacement carries certain risks, especially as you become older. If you are thinking about using estrogen or estrogen with progestin treatments, discuss the benefits and risks with your health care provider. What should I know about heart disease and stroke? Heart disease, heart attack, and stroke become more likely as you age. This may be due, in part, to the hormonal changes that your body experiences during menopause. These can affect how your body processes dietary fats, triglycerides, and cholesterol. Heart attack and stroke are both medical emergencies. There are many things that you can do to help prevent heart disease  and stroke:  Have your blood pressure checked at least every 1-2 years. High blood pressure causes heart disease and increases the risk of stroke.  If you are 53-22 years old, ask your health care provider if you should take aspirin to prevent a heart attack or a stroke.  Do not use any tobacco products, including cigarettes, chewing tobacco, or electronic cigarettes. If you need help quitting, ask your health care provider.  It is important to eat a healthy diet and maintain a healthy weight. ? Be sure to include plenty of vegetables, fruits, low-fat dairy products, and lean protein. ? Avoid eating foods that are high in solid fats, added sugars, or salt (sodium).  Get regular exercise. This is one of the most important things that you can do for your health. ? Try to exercise for at least 150 minutes each week. The type of exercise that you do should increase your heart rate and make you sweat. This is known as moderate-intensity exercise. ? Try to do strengthening exercises at least twice each week. Do these in addition to the moderate-intensity exercise.  Know your numbers.Ask your health care provider to check your cholesterol and your blood glucose. Continue to have your blood tested as directed by your health care provider.  What should I know about cancer screening? There are several types of cancer. Take the following steps to reduce your risk and to catch any cancer development as early as possible. Breast Cancer  Practice breast self-awareness. ? This means understanding how your breasts normally appear and feel. ? It also means doing regular breast self-exams. Let your health care provider know about any changes, no matter how small.  If you are 40  or older, have a clinician do a breast exam (clinical breast exam or CBE) every year. Depending on your age, family history, and medical history, it may be recommended that you also have a yearly breast X-ray (mammogram).  If you  have a family history of breast cancer, talk with your health care provider about genetic screening.  If you are at high risk for breast cancer, talk with your health care provider about having an MRI and a mammogram every year.  Breast cancer (BRCA) gene test is recommended for women who have family members with BRCA-related cancers. Results of the assessment will determine the need for genetic counseling and BRCA1 and for BRCA2 testing. BRCA-related cancers include these types: ? Breast. This occurs in males or females. ? Ovarian. ? Tubal. This may also be called fallopian tube cancer. ? Cancer of the abdominal or pelvic lining (peritoneal cancer). ? Prostate. ? Pancreatic.  Cervical, Uterine, and Ovarian Cancer Your health care provider may recommend that you be screened regularly for cancer of the pelvic organs. These include your ovaries, uterus, and vagina. This screening involves a pelvic exam, which includes checking for microscopic changes to the surface of your cervix (Pap test).  For women ages 21-65, health care providers may recommend a pelvic exam and a Pap test every three years. For women ages 79-65, they may recommend the Pap test and pelvic exam, combined with testing for human papilloma virus (HPV), every five years. Some types of HPV increase your risk of cervical cancer. Testing for HPV may also be done on women of any age who have unclear Pap test results.  Other health care providers may not recommend any screening for nonpregnant women who are considered low risk for pelvic cancer and have no symptoms. Ask your health care provider if a screening pelvic exam is right for you.  If you have had past treatment for cervical cancer or a condition that could lead to cancer, you need Pap tests and screening for cancer for at least 20 years after your treatment. If Pap tests have been discontinued for you, your risk factors (such as having a new sexual partner) need to be  reassessed to determine if you should start having screenings again. Some women have medical problems that increase the chance of getting cervical cancer. In these cases, your health care provider may recommend that you have screening and Pap tests more often.  If you have a family history of uterine cancer or ovarian cancer, talk with your health care provider about genetic screening.  If you have vaginal bleeding after reaching menopause, tell your health care provider.  There are currently no reliable tests available to screen for ovarian cancer.  Lung Cancer Lung cancer screening is recommended for adults 69-62 years old who are at high risk for lung cancer because of a history of smoking. A yearly low-dose CT scan of the lungs is recommended if you:  Currently smoke.  Have a history of at least 30 pack-years of smoking and you currently smoke or have quit within the past 15 years. A pack-year is smoking an average of one pack of cigarettes per day for one year.  Yearly screening should:  Continue until it has been 15 years since you quit.  Stop if you develop a health problem that would prevent you from having lung cancer treatment.  Colorectal Cancer  This type of cancer can be detected and can often be prevented.  Routine colorectal cancer screening usually begins at  age 42 and continues through age 45.  If you have risk factors for colon cancer, your health care provider may recommend that you be screened at an earlier age.  If you have a family history of colorectal cancer, talk with your health care provider about genetic screening.  Your health care provider may also recommend using home test kits to check for hidden blood in your stool.  A small camera at the end of a tube can be used to examine your colon directly (sigmoidoscopy or colonoscopy). This is done to check for the earliest forms of colorectal cancer.  Direct examination of the colon should be repeated every  5-10 years until age 71. However, if early forms of precancerous polyps or small growths are found or if you have a family history or genetic risk for colorectal cancer, you may need to be screened more often.  Skin Cancer  Check your skin from head to toe regularly.  Monitor any moles. Be sure to tell your health care provider: ? About any new moles or changes in moles, especially if there is a change in a mole's shape or color. ? If you have a mole that is larger than the size of a pencil eraser.  If any of your family members has a history of skin cancer, especially at a young age, talk with your health care provider about genetic screening.  Always use sunscreen. Apply sunscreen liberally and repeatedly throughout the day.  Whenever you are outside, protect yourself by wearing long sleeves, pants, a wide-brimmed hat, and sunglasses.  What should I know about osteoporosis? Osteoporosis is a condition in which bone destruction happens more quickly than new bone creation. After menopause, you may be at an increased risk for osteoporosis. To help prevent osteoporosis or the bone fractures that can happen because of osteoporosis, the following is recommended:  If you are 46-71 years old, get at least 1,000 mg of calcium and at least 600 mg of vitamin D per day.  If you are older than age 55 but younger than age 65, get at least 1,200 mg of calcium and at least 600 mg of vitamin D per day.  If you are older than age 54, get at least 1,200 mg of calcium and at least 800 mg of vitamin D per day.  Smoking and excessive alcohol intake increase the risk of osteoporosis. Eat foods that are rich in calcium and vitamin D, and do weight-bearing exercises several times each week as directed by your health care provider. What should I know about how menopause affects my mental health? Depression may occur at any age, but it is more common as you become older. Common symptoms of depression  include:  Low or sad mood.  Changes in sleep patterns.  Changes in appetite or eating patterns.  Feeling an overall lack of motivation or enjoyment of activities that you previously enjoyed.  Frequent crying spells.  Talk with your health care provider if you think that you are experiencing depression. What should I know about immunizations? It is important that you get and maintain your immunizations. These include:  Tetanus, diphtheria, and pertussis (Tdap) booster vaccine.  Influenza every year before the flu season begins.  Pneumonia vaccine.  Shingles vaccine.  Your health care provider may also recommend other immunizations. This information is not intended to replace advice given to you by your health care provider. Make sure you discuss any questions you have with your health care provider. Document Released: 10/01/2005  Document Revised: 02/27/2016 Document Reviewed: 05/13/2015 Elsevier Interactive Patient Education  2018 [employer] Inc.  

## 2018-06-07 NOTE — Progress Notes (Signed)
Patient: Amanda Day, Female    DOB: 1965/10/29, 52 y.o.   MRN: [id number] Visit Date: 06/07/2018  Today's Provider: Mar Daring, PA-C   Chief Complaint  Patient presents with  . Annual Exam   Subjective:    Annual physical exam Amanda Day is a 52 y.o. female who presents today for health maintenance and complete physical. She feels well. She reports exercising. She reports she is sleeping well.  Last CPE:01/06/2017 Mammogram:06/15/17-[facility] Pap:01/06/17-insufficient cellularity, HPV-Neg ----------------------------------------------------------------- Postmenopausal Bleeding: 2 weeks ago, started like normal menses and lasted 6 days. Had not had menstrual in 13 months previously.   Tinnitus: Bilaterally R>L. Using meditation to help sleep and distract from noise. Father had early onset hearing loss before passing. Mother has hearing aids.   Hands Falling Asleep: Positional, when she is in deep wrist extension with weight on hands (during exercise and biking). Does not awake her from sleep. Able to return to normal quickly once out of position.   Knee Pain: Had hyaluronic acid injections from [facility] in May as well as completed formal PT without resolution of symptoms. Hyaluronic acid injections did not give any relief. She has been doing at home exercises, massages, and water aerobics. She is going to f/u with [facility] in January if not improving on her own.   Review of Systems  Constitutional: Negative.   HENT: Positive for congestion and tinnitus.   Eyes: Negative.   Respiratory: Negative.   Cardiovascular: Negative.   Gastrointestinal: Negative.   Endocrine: Negative.   Genitourinary: Positive for vaginal bleeding ("for 1 week Menstrual after 13 months?").  Musculoskeletal: Positive for arthralgias and joint swelling.       "hands falling asleep?"  Skin: Negative.   Allergic/Immunologic: Positive for environmental  allergies.  Neurological: Negative.   Hematological: Negative.   Psychiatric/Behavioral: Negative.   All other systems reviewed and are negative.   Social History      She  reports that she has never smoked. She has never used smokeless tobacco. She reports that she drinks alcohol. She reports that she does not use drugs.       Social History   Socioeconomic History  . Marital status: Divorced    Spouse name: Not on file  . Number of children: Not on file  . Years of education: Not on file  . Highest education level: Not on file  Occupational History  . Not on file  Social Needs  . Financial resource strain: Not on file  . Food insecurity:    Worry: Not on file    Inability: Not on file  . Transportation needs:    Medical: Not on file    Non-medical: Not on file  Tobacco Use  . Smoking status: Never Smoker  . Smokeless tobacco: Never Used  Substance and Sexual Activity  . Alcohol use: Yes    Comment: 4 nights a week, wine or beer  . Drug use: No  . Sexual activity: Not on file  Lifestyle  . Physical activity:    Days per week: Not on file    Minutes per session: Not on file  . Stress: Not on file  Relationships  . Social connections:    Talks on phone: Not on file    Gets together: Not on file    Attends religious service: Not on file    Active member of club or organization: Not on file    Attends meetings of clubs or  organizations: Not on file    Relationship status: Not on file  Other Topics Concern  . Not on file  Social History Narrative  . Not on file    Past Medical History:  Diagnosis Date  . Anxiety   . Arthritis    right great toe  . GERD (gastroesophageal reflux disease)    OCC  . Hypertension   . Motion sickness    long car rides, amusement park rides  . Seasonal allergies      Patient Active Problem List   Diagnosis Date Noted  . S/P arthroscopy of left knee 11/21/2016  . Knee sprain 03/21/2015  . Special screening for malignant  neoplasms, colon   . Adaptation reaction 12/26/2014  . Allergic rhinitis 12/26/2014  . Blood pressure elevated 12/26/2014  . Essential (primary) hypertension 12/26/2014  . Acid reflux 12/26/2014  . Avitaminosis D 12/26/2014    Past Surgical History:  Procedure Laterality Date  . COLONOSCOPY N/A 02/21/2015   Procedure: COLONOSCOPY;  Surgeon: Lucilla Lame, MD;  Location: [facility];  Service: Gastroenterology;  Laterality: N/A;  . KNEE ARTHROSCOPY Left 10/04/2016   Procedure: ARTHROSCOPY KNEE;  Surgeon: Dereck Leep, MD;  Location: [facility];  Service: Orthopedics;  Laterality: Left;  . KNEE ARTHROSCOPY Right 04/06/2017   Procedure: ARTHROSCOPY KNEE, PARTIAL MEDIAL MENISCECTOMY, MEDIAL CHONDROPLASTY;  Surgeon: Dereck Leep, MD;  Location: [facility];  Service: Orthopedics;  Laterality: Right;  . 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 TUBAL LIGATION  2009    Family History        Family Status  Relation Name Status  . Mother  Alive  . Father  Deceased at age 65  . Sister  Alive  . Brother  Alive        Her family history includes Anxiety disorder in her mother; Arrhythmia in her father; Cancer in her father; Hypertension in her brother, father, mother, and sister; Mitral valve prolapse in her mother.      No Known Allergies   Current Outpatient Medications:  .  amLODipine (NORVASC) 10 MG tablet, TAKE 1 TABLET(10 MG) BY MOUTH DAILY, Disp: 90 tablet, Rfl: 1 .  Ergocalciferol (VITAMIN D2) 2000 UNITS TABS, Take 2,000 Units by mouth every evening. , Disp: , Rfl:  .  fluticasone (FLONASE) 50 MCG/ACT nasal spray, SHAKE LIQUID AND USE 2 SPRAYS IN EACH NOSTRIL DAILY AS NEEDED, Disp: 48 g, Rfl: 1 .  loratadine (CLARITIN) 10 MG tablet, Take 10 mg by mouth daily. , Disp: , Rfl:  .  metoprolol succinate (TOPROL-XL) 50 MG 24 hr tablet, Take 1 tablet (50 mg total) by mouth daily. Please schedule office visit before future refills, Disp: 90 tablet, Rfl:  0 .  montelukast (SINGULAIR) 10 MG tablet, TAKE 1 TABLET(10 MG) BY MOUTH AT BEDTIME, Disp: 90 tablet, Rfl: 1 .  Multiple Vitamins-Minerals (OSTEOPRIME PLUS PO), Take by mouth., Disp: , Rfl:  .  omeprazole (PRILOSEC OTC) 20 MG tablet, Take 20 mg by mouth daily as needed (acid reflux)., Disp: , Rfl:  .  sertraline (ZOLOFT) 50 MG tablet, TAKE 1/2 TO 1 TABLET BY MOUTH EVERY DAY, Disp: 90 tablet, Rfl: 1 .  HYDROcodone-acetaminophen (NORCO) 5-325 MG tablet, Take 1-2 tablets by mouth every 4 (four) hours as needed for moderate pain. (Patient not taking: Reported on 06/30/2017), Disp: 30 tablet, Rfl: 0 .  ibuprofen (ADVIL,MOTRIN) 200 MG tablet, Take 400 mg by mouth 3 (three) times daily  as needed for headache or mild pain. , Disp: , Rfl:    Patient Care Team: Mar Daring, PA-C as PCP - General (Family Medicine)      Objective:   Vitals: BP 140/80 (BP Location: Left Arm, Patient Position: Sitting, Cuff Size: Normal)   Pulse 78   Temp 98.4 F (36.9 C) (Oral)   Resp 16   Ht 5\' 8"  (1.727 m)   Wt 211 lb 12.8 oz (96.1 kg)   LMP 05/23/2018   SpO2 97%   BMI 32.20 kg/m    Vitals:   06/07/18 1404  BP: 140/80  Pulse: 78  Resp: 16  Temp: 98.4 F (36.9 C)  TempSrc: Oral  SpO2: 97%  Weight: 211 lb 12.8 oz (96.1 kg)  Height: 5\' 8"  (1.727 m)     Physical Exam  Constitutional: She is oriented to person, place, and time. She appears well-developed and well-nourished. No distress.  HENT:  Head: Normocephalic and atraumatic.  Right Ear: Tympanic membrane, external ear and ear canal normal.  Left Ear: Tympanic membrane, external ear and ear canal normal.  Nose: Nose normal.  Mouth/Throat: Uvula is midline, oropharynx is clear and moist and mucous membranes are normal. No oropharyngeal exudate.  Eyes: Pupils are equal, round, and reactive to light. Conjunctivae and EOM are normal. Right eye exhibits no discharge. Left eye exhibits no discharge. No scleral icterus.  Neck: Normal range of  motion. Neck supple. No JVD present. Carotid bruit is not present. No tracheal deviation present. No thyromegaly present.  Cardiovascular: Normal rate, regular rhythm, normal heart sounds and intact distal pulses. Exam reveals no gallop and no friction rub.  No murmur heard. Pulmonary/Chest: Effort normal and breath sounds normal. No respiratory distress. She has no wheezes. She has no rales. She exhibits no tenderness. Right breast exhibits no inverted nipple, no mass, no nipple discharge, no skin change and no tenderness. Left breast exhibits no inverted nipple, no mass, no nipple discharge, no skin change and no tenderness. No breast swelling, tenderness, discharge or bleeding. Breasts are symmetrical.  Abdominal: Soft. Bowel sounds are normal. She exhibits no distension and no mass. There is no tenderness. There is no rebound and no guarding.  Musculoskeletal: Normal range of motion. She exhibits no edema or tenderness.  Lymphadenopathy:    She has no cervical adenopathy.  Neurological: She is alert and oriented to person, place, and time.  Skin: Skin is warm and dry. No rash noted. She is not diaphoretic.  Psychiatric: She has a normal mood and affect. Her behavior is normal. Judgment and thought content normal.  Vitals reviewed.    Depression Screen PHQ 2/9 Scores 06/07/2018 07/28/2017 10/29/2015  PHQ - 2 Score 0 0 1  PHQ- 9 Score 2 - -      Assessment & Plan:     Routine Health Maintenance and Physical Exam  Exercise Activities and Dietary recommendations Goals   None     Immunization History  Administered Date(s) Administered  . Influenza,inj,Quad PF,6+ Mos 05/24/2015, 07/28/2017  . Tdap 03/24/2011    Health Maintenance  Topic Date Due  . HIV Screening  09/19/1979  . INFLUENZA VACCINE  03/23/2018  . MAMMOGRAM  06/16/2019  . PAP SMEAR  01/07/2020  . TETANUS/TDAP  03/23/2021  . COLONOSCOPY  02/20/2025     Discussed health benefits of physical activity, and  encouraged her to engage in regular exercise appropriate for her age and condition.    1. Annual physical exam Normal physical exam today.  Will check labs as below and f/u pending lab results. If labs are stable and WNL she will not need to have these rechecked for one year at her next annual physical exam. She is to call the office in the meantime if she has any acute issue, questions or concerns. - CBC with Differential/Platelet - Comprehensive metabolic panel - TSH  2. Cervical cancer screening Will postpone since she is being referred to GYN for postmenopausal bleeding.   3. Screening for diabetes mellitus (DM) Will check labs as below and f/u pending results. - Hemoglobin A1c  4. Encounter for lipid screening for cardiovascular disease Will check labs as below and f/u pending results. - Lipid panel  5. Postmenopausal bleeding New onset 2 weeks ago. Reports started like normal menstruation but has not had one in 13 months prior.  - Ambulatory referral to Obstetrics / Gynecology  6. Screening for HIV without presence of risk factors Will check labs as below and f/u pending results. - HIV antibody (with reflex)  7. Bilateral hearing loss, unspecified hearing loss type Will refer to Audiology as below for a baseline hearing screen to see where she is at this time.  - Ambulatory referral to Audiology  8. BMI 32.0-32.9,adult Exercising, doing water aerobics 5 x per week. Counseled patient on healthy lifestyle modifications including dieting and exercise.   9. Need for influenza vaccination Flu vaccine given today without complication. Patient sat upright for 15 minutes to check for adverse reaction before being released. - Flu Vaccine QUAD 36+ mos IM  --------------------------------------------------------------------    Mar Daring, PA-C  [facility]

## 2018-06-08 ENCOUNTER — Telehealth: Payer: Self-pay | Admitting: Certified Nurse Midwife

## 2018-06-08 NOTE — Telephone Encounter (Signed)
[facility] referring for Postmenopausal bleeding with [name]. Called and left voicemail for patient to call back to be schedule

## 2018-06-16 LAB — COMPREHENSIVE METABOLIC PANEL
ALK PHOS: 63 IU/L (ref 39–117)
ALT: 15 IU/L (ref 0–32)
AST: 15 IU/L (ref 0–40)
Albumin/Globulin Ratio: 1.7 (ref 1.2–2.2)
Albumin: 4.2 g/dL (ref 3.5–5.5)
BUN / CREAT RATIO: 27 — AB (ref 9–23)
BUN: 24 mg/dL (ref 6–24)
Bilirubin Total: 0.4 mg/dL (ref 0.0–1.2)
CO2: 23 mmol/L (ref 20–29)
CREATININE: 0.89 mg/dL (ref 0.57–1.00)
Calcium: 9.4 mg/dL (ref 8.7–10.2)
Chloride: 102 mmol/L (ref 96–106)
GFR calc Af Amer: 86 mL/min/{1.73_m2} (ref >59)
GFR calc non Af Amer: 74 mL/min/{1.73_m2} (ref >59)
Globulin, Total: 2.5 g/dL (ref 1.5–4.5)
Glucose: 85 mg/dL (ref 65–99)
Potassium: 4.2 mmol/L (ref 3.5–5.2)
Sodium: 139 mmol/L (ref 134–144)
Total Protein: 6.7 g/dL (ref 6.0–8.5)

## 2018-06-16 LAB — TSH: TSH: 1.19 u[IU]/mL (ref 0.450–4.500)

## 2018-06-16 LAB — CBC WITH DIFFERENTIAL/PLATELET
BASOS: 0 %
Basophils Absolute: 0 10*3/uL (ref 0.0–0.2)
EOS (ABSOLUTE): 0.1 10*3/uL (ref 0.0–0.4)
Eos: 1 %
HEMATOCRIT: 38.5 % (ref 34.0–46.6)
Hemoglobin: 12.8 g/dL (ref 11.1–15.9)
Immature Grans (Abs): 0 10*3/uL (ref 0.0–0.1)
Immature Granulocytes: 0 %
LYMPHS ABS: 1.3 10*3/uL (ref 0.7–3.1)
Lymphs: 27 %
MCH: 29.7 pg (ref 26.6–33.0)
MCHC: 33.2 g/dL (ref 31.5–35.7)
MCV: 89 fL (ref 79–97)
MONOCYTES: 9 %
Monocytes Absolute: 0.4 10*3/uL (ref 0.1–0.9)
NEUTROS ABS: 3 10*3/uL (ref 1.4–7.0)
Neutrophils: 63 %
Platelets: 285 10*3/uL (ref 150–450)
RBC: 4.31 x10E6/uL (ref 3.77–5.28)
RDW: 12.1 % — ABNORMAL LOW (ref 12.3–15.4)
WBC: 4.8 10*3/uL (ref 3.4–10.8)

## 2018-06-16 LAB — LIPID PANEL
CHOL/HDL RATIO: 4.8 ratio — AB (ref 0.0–4.4)
Cholesterol, Total: 232 mg/dL — ABNORMAL HIGH (ref 100–199)
HDL: 48 mg/dL (ref >39)
LDL Calculated: 157 mg/dL — ABNORMAL HIGH (ref 0–99)
Triglycerides: 134 mg/dL (ref 0–149)
VLDL CHOLESTEROL CAL: 27 mg/dL (ref 5–40)

## 2018-06-16 LAB — HEMOGLOBIN A1C
Est. average glucose Bld gHb Est-mCnc: 108 mg/dL
HEMOGLOBIN A1C: 5.4 % (ref 4.8–5.6)

## 2018-06-16 LAB — HIV ANTIBODY (ROUTINE TESTING W REFLEX): HIV Screen 4th Generation wRfx: NONREACTIVE

## 2018-06-20 NOTE — Progress Notes (Signed)
Gynecology Annual Exam  PCP: Mar Daring, PA-C  Chief Complaint:  Chief Complaint  Patient presents with  . Gynecologic Exam    post menopausal bleeding    History of Present Illness: Amanda Day is a 52 y.o.  White female , G3 P0212 , was referred by her PCP for evaluation of postmenopausal bleeding. She had a 6 day bleed starting on 1 October after having no bleeding x 13 months (August 2018) and that menses came after 12 months without a menses (August 2017). The 1 October bleeding was preceded by mood swings and cramping. The first 2 days of bleeding were heavier requiring a pad change every 2-3 hours.    The patient's past medical history is notable for a history of VIN3, hypertension, depression, and arthritis.Marland Kitchen  Her last Pap smear was  01/06/17-insufficient cellularity, HPV-negative She is not currently sexually active. She has had a BTL.   Review of Systems: Review of Systems  Constitutional: Negative for chills, fever and weight loss.  HENT: Positive for tinnitus. Negative for congestion, sinus pain and sore throat.   Eyes: Negative for blurred vision and pain.  Respiratory: Negative for hemoptysis, shortness of breath and wheezing.   Cardiovascular: Negative for chest pain, palpitations and leg swelling.  Gastrointestinal: Negative for abdominal pain, blood in stool, diarrhea, heartburn, nausea and vomiting.  Genitourinary: Negative for dysuria, frequency, hematuria and urgency.       Positive for postmenopausal bleeding  Musculoskeletal: Positive for joint pain. Negative for back pain and myalgias.  Skin: Negative for itching and rash.  Neurological: Negative for dizziness, tingling and headaches.  Endo/Heme/Allergies: Positive for environmental allergies. Negative for polydipsia. Does not bruise/bleed easily.       Negative for hirsutism   Psychiatric/Behavioral: Negative for depression. The patient is not nervous/anxious and does not have  insomnia.     Past Medical History:  Past Medical History:  Diagnosis Date  . Anxiety   . Arthritis    right great toe  . GERD (gastroesophageal reflux disease)    OCC  . Hypertension   . Motion sickness    long car rides, amusement park rides  . Seasonal allergies     Past Surgical History:  Past Surgical History:  Procedure Laterality Date  . COLONOSCOPY N/A 02/21/2015   Procedure: COLONOSCOPY;  Surgeon: [place] Lame, MD;  Location: [facility];  Service: Gastroenterology;  Laterality: N/A;  . KNEE ARTHROSCOPY Left 10/04/2016   Procedure: ARTHROSCOPY KNEE;  Surgeon: Dereck Leep, MD;  Location: [facility];  Service: Orthopedics;  Laterality: Left;  . KNEE ARTHROSCOPY Right 04/06/2017   Procedure: ARTHROSCOPY KNEE, PARTIAL MEDIAL MENISCECTOMY, MEDIAL CHONDROPLASTY;  Surgeon: Dereck Leep, MD;  Location: [facility];  Service: Orthopedics;  Laterality: Right;  . 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 TUBAL LIGATION  2009    Family History:  Family History  Problem Relation Age of Onset  . Hypertension Mother   . Anxiety disorder Mother   . Mitral valve prolapse Mother   . Cancer Father        Lung and Kidney  . Hypertension Father   . Arrhythmia Father   . Hypertension Sister   . Hypertension Brother     Social History:  Social History   Socioeconomic History  . Marital status: Divorced    Spouse name: Not on file  . Number of children: 2  . Years  of education: 56  . Highest education level: Not on file  Occupational History  . 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 Financial resource strain: Not on file  . Food insecurity:    Worry: Not on file    Inability: Not on file  . Transportation needs:    Medical: Not on file    Non-medical: Not on file  Tobacco Use  . Smoking status: Never Smoker  . Smokeless tobacco: Never Used  Substance and Sexual Activity  . Alcohol use: Yes     Comment: 4 nights a week, wine or beer  . Drug use: No  . Sexual activity: Not Currently    Birth control/protection: Surgical  Lifestyle  . Physical activity:    Days per week: Not on file    Minutes per session: Not on file  . Stress: Not on file  Relationships  . Social connections:    Talks on phone: Not on file    Gets together: Not on file    Attends religious service: Not on file    Active member of club or organization: Not on file    Attends meetings of clubs or organizations: Not on file    Relationship status: Not on file  . Intimate partner violence:    Fear of current or ex partner: Not on file    Emotionally abused: Not on file    Physically abused: Not on file    Forced sexual activity: Not on file  Other Topics Concern  . Not on file  Social History Narrative  . Not on file    Allergies:  No Known Allergies  Medications:  Current Outpatient Medications on File Prior to Visit  Medication Sig Dispense Refill  . amLODipine (NORVASC) 10 MG tablet TAKE 1 TABLET(10 MG) BY MOUTH DAILY 90 tablet 1  . Cholecalciferol (VITAMIN D) 2000 units tablet Take 1 tablet by mouth daily.    . fluticasone (FLONASE) 50 MCG/ACT nasal spray SHAKE LIQUID AND USE 2 SPRAYS IN EACH NOSTRIL DAILY AS NEEDED 48 g 1  . Glucosamine-Chondroitin-MSM 500-200-150 MG TABS Take 1 tablet by mouth daily.    Marland Kitchen ibuprofen (ADVIL,MOTRIN) 200 MG tablet Take 400 mg by mouth 3 (three) times daily as needed for headache or mild pain.     Marland Kitchen loratadine (CLARITIN) 10 MG tablet Take 10 mg by mouth daily.     . metoprolol succinate (TOPROL-XL) 50 MG 24 hr tablet Take 1 tablet by mouth daily.    . montelukast (SINGULAIR) 10 MG tablet TAKE 1 TABLET(10 MG) BY MOUTH AT BEDTIME 90 tablet 1  . omeprazole (PRILOSEC) 10 MG capsule Take 1 capsule by mouth as needed.    Marland Kitchen OVER THE COUNTER MEDICATION Take 1 tablet by mouth daily.    . sertraline (ZOLOFT) 50 MG tablet TAKE 1/2 TO 1 TABLET BY MOUTH EVERY DAY 90 tablet 1    No current facility-administered medications on file prior to visit.    Physical Exam Vitals: BP 130/80   Pulse 78   Ht 5\' 8"  (1.727 m)   Wt 210 lb (95.3 kg)   LMP 05/23/2018   BMI 31.93 kg/m   General: pleasant WF in NAD HEENT: normocephalic, anicteric  Pulmonary: No increased work of breathing Cardiovascular: regular rate Abdomen: Soft, non-tender, non-distended.  Umbilicus without lesions.  No hepatomegaly or masses palpable. No evidence of hernia. Well healed Pfan incision on lower abdomen Genitourinary:  External: Normal external female genitalia.  Normal urethral  meatus, normal Bartholin's and Skene's glands.    Vagina: Normal vaginal mucosa, no evidence of prolapse.    Cervix: Grossly normal in appearance, stenotic, no bleeding, non-tender  Uterus: Anteverted, normal size, shape, and consistency, mobile, and non-tender  Adnexa: No adnexal masses, non-tender  Rectal: deferred  Lymphatic: no evidence of inguinal lymphadenopathy Neurologic: Grossly intact Psychiatric: mood appropriate, affect full     Assessment: 52 y.o.  With postmenopausal bleeding Screening for cervical cancer  Plan:   1) Pap smear done  2) Discussed further evaluation of postmenopausal bleeding with endometrial biopsy and pelvic ultrasound. Informed consent obtained for endometrial biopsy. Please see procedure note below.  Dalia Heading, CNM   Endometrial Biopsy After discussion with the patient regarding her abnormal uterine bleeding I recommended that she proceed with an endometrial biopsy for further diagnosis. The risks, benefits, alternatives, and indications for an endometrial biopsy were discussed with the patient in detail. She understood the risks including infection, bleeding, cervical laceration and uterine perforation.  Verbal consent was obtained.   PROCEDURE NOTE:  Pipelle endometrial biopsy was performed using aseptic technique with iodine preparation.  Hurricaine  anesthetic was sprayed on the cervix. The cervix was grasped with a tenaculum. The uterus was sounded to a length of 7 cm.  Vigorous sampling obtaned little endometrial tissue and with minimal blood loss.  The The tenaculum was removed. Thepatient tolerated the procedure well. POst procedure instructions given.  Disposition will be pending pathology.  Pelvic ultrasound was essentially normal. EM stripe measured between 3.6 and 5.67mm. No masses were seen.  Will call patient with results of the endometrial biopsy results   Dalia Heading, CNM

## 2018-06-21 ENCOUNTER — Other Ambulatory Visit (HOSPITAL_COMMUNITY)
Admission: RE | Admit: 2018-06-21 | Discharge: 2018-06-21 | Disposition: A | Discharge status: Home / Self Care | Payer: BC Managed Care – PPO | Source: Ambulatory Visit | Attending: Certified Nurse Midwife | Admitting: Certified Nurse Midwife

## 2018-06-21 ENCOUNTER — Other Ambulatory Visit: Payer: Self-pay | Admitting: Certified Nurse Midwife

## 2018-06-21 ENCOUNTER — Other Ambulatory Visit (INDEPENDENT_AMBULATORY_CARE_PROVIDER_SITE_OTHER): Payer: BC Managed Care – PPO

## 2018-06-21 ENCOUNTER — Ambulatory Visit (INDEPENDENT_AMBULATORY_CARE_PROVIDER_SITE_OTHER): Payer: BC Managed Care – PPO | Admitting: Certified Nurse Midwife

## 2018-06-21 ENCOUNTER — Encounter: Payer: Self-pay | Admitting: Certified Nurse Midwife

## 2018-06-21 VITALS — BP 130/80 | HR 78 | Ht 68.0 in | Wt 210.0 lb

## 2018-06-21 DIAGNOSIS — N950 Postmenopausal bleeding: Secondary | ICD-10-CM

## 2018-06-21 DIAGNOSIS — Z124 Encounter for screening for malignant neoplasm of cervix: Secondary | ICD-10-CM | POA: Diagnosis present

## 2018-06-23 LAB — CYTOLOGY - PAP
DIAGNOSIS: NEGATIVE
HPV (WINDOPATH): NOT DETECTED

## 2018-07-03 ENCOUNTER — Other Ambulatory Visit: Payer: Self-pay | Admitting: Physician Assistant

## 2018-07-03 DIAGNOSIS — I10 Essential (primary) hypertension: Secondary | ICD-10-CM

## 2018-07-26 ENCOUNTER — Telehealth: Payer: Self-pay | Admitting: Physician Assistant

## 2018-07-26 DIAGNOSIS — J309 Allergic rhinitis, unspecified: Secondary | ICD-10-CM

## 2018-07-26 MED ORDER — FLUTICASONE PROPIONATE 50 MCG/ACT NA SUSP: NASAL | 1 refills | Status: DC

## 2018-07-26 NOTE — Telephone Encounter (Signed)
refilled 

## 2018-07-26 NOTE — Telephone Encounter (Signed)
[facility] faxed refill request for the following medications:   fluticasone (FLONASE) 50 MCG/ACT nasal spray   Please advise.  

## 2018-07-26 NOTE — Telephone Encounter (Signed)
Last filled 02/03/18, please review.KW

## 2018-07-28 ENCOUNTER — Other Ambulatory Visit: Payer: Self-pay | Admitting: Physician Assistant

## 2018-07-28 DIAGNOSIS — I10 Essential (primary) hypertension: Secondary | ICD-10-CM

## 2018-07-28 DIAGNOSIS — F325 Major depressive disorder, single episode, in full remission: Secondary | ICD-10-CM

## 2018-10-01 ENCOUNTER — Other Ambulatory Visit: Payer: Self-pay | Admitting: Physician Assistant

## 2018-10-01 DIAGNOSIS — I10 Essential (primary) hypertension: Secondary | ICD-10-CM

## 2018-10-09 LAB — HM MAMMOGRAPHY

## 2018-10-11 ENCOUNTER — Encounter: Payer: Self-pay | Admitting: Physician Assistant

## 2018-10-27 ENCOUNTER — Other Ambulatory Visit: Payer: Self-pay | Admitting: Otolaryngology

## 2018-10-27 DIAGNOSIS — H9041 Sensorineural hearing loss, unilateral, right ear, with unrestricted hearing on the contralateral side: Secondary | ICD-10-CM

## 2018-10-27 DIAGNOSIS — IMO0001 Reserved for inherently not codable concepts without codable children: Secondary | ICD-10-CM

## 2018-10-28 ENCOUNTER — Other Ambulatory Visit: Payer: Self-pay | Admitting: Physician Assistant

## 2018-10-28 DIAGNOSIS — J309 Allergic rhinitis, unspecified: Secondary | ICD-10-CM

## 2018-11-06 ENCOUNTER — Other Ambulatory Visit: Payer: Self-pay

## 2018-11-06 ENCOUNTER — Ambulatory Visit
Admission: RE | Admit: 2018-11-06 | Discharge: 2018-11-06 | Disposition: A | Discharge status: Home / Self Care | Payer: BC Managed Care – PPO | Source: Ambulatory Visit | Attending: Otolaryngology | Admitting: Otolaryngology

## 2018-11-06 DIAGNOSIS — H9041 Sensorineural hearing loss, unilateral, right ear, with unrestricted hearing on the contralateral side: Secondary | ICD-10-CM | POA: Diagnosis not present

## 2018-11-06 DIAGNOSIS — IMO0001 Reserved for inherently not codable concepts without codable children: Secondary | ICD-10-CM

## 2018-11-06 LAB — POCT I-STAT CREATININE: Creatinine, Ser: 1 mg/dL (ref 0.44–1.00)

## 2018-11-06 MED ORDER — GADOBUTROL 1 MMOL/ML IV SOLN
10.0000 mL | Freq: Once | INTRAVENOUS | Status: AC | PRN
  Administered 2018-11-06: 10 mL via INTRAVENOUS

## 2018-11-29 ENCOUNTER — Encounter: Payer: Self-pay | Admitting: Physician Assistant

## 2018-12-15 IMAGING — CR DG KNEE COMPLETE 4+V*R*
1 series · 4 of 4 positions shown · non-contrast
Comparison: None.

CLINICAL DATA: Right knee pain for several days, no acute injury

EXAM:
RIGHT KNEE - COMPLETE 4+ VIEW

[Series 1: dg knee complete 4 views right · 0.14mm/px · 4 of 4 slices shown]
[im 1/4]
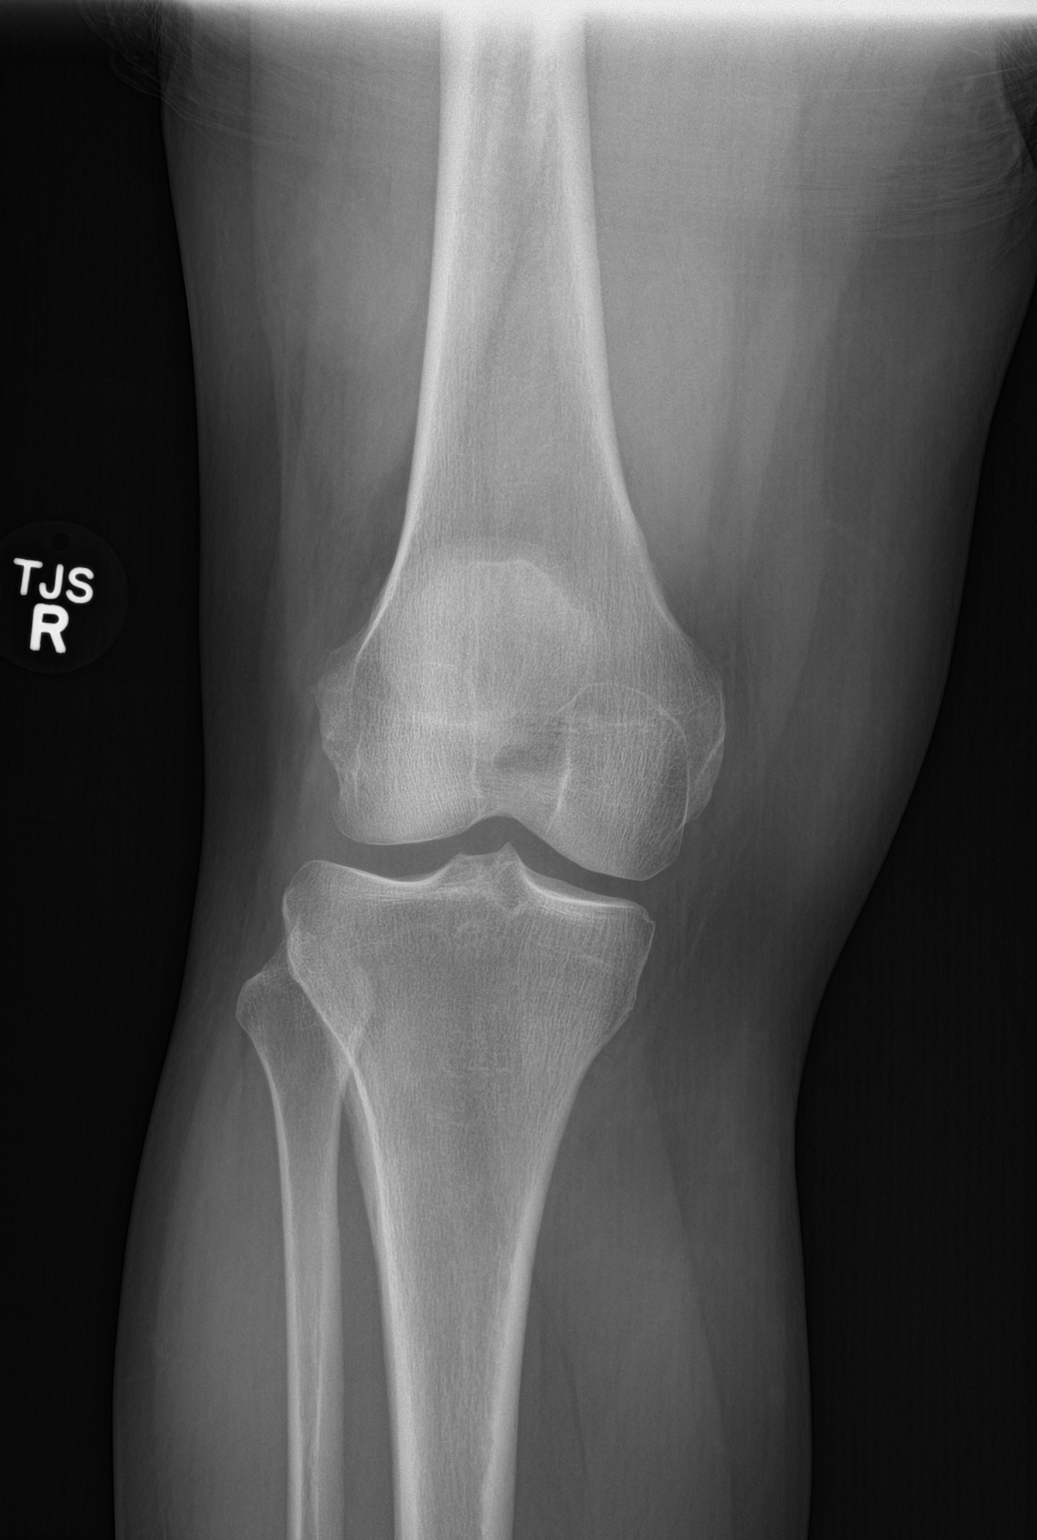
[im 2/4]
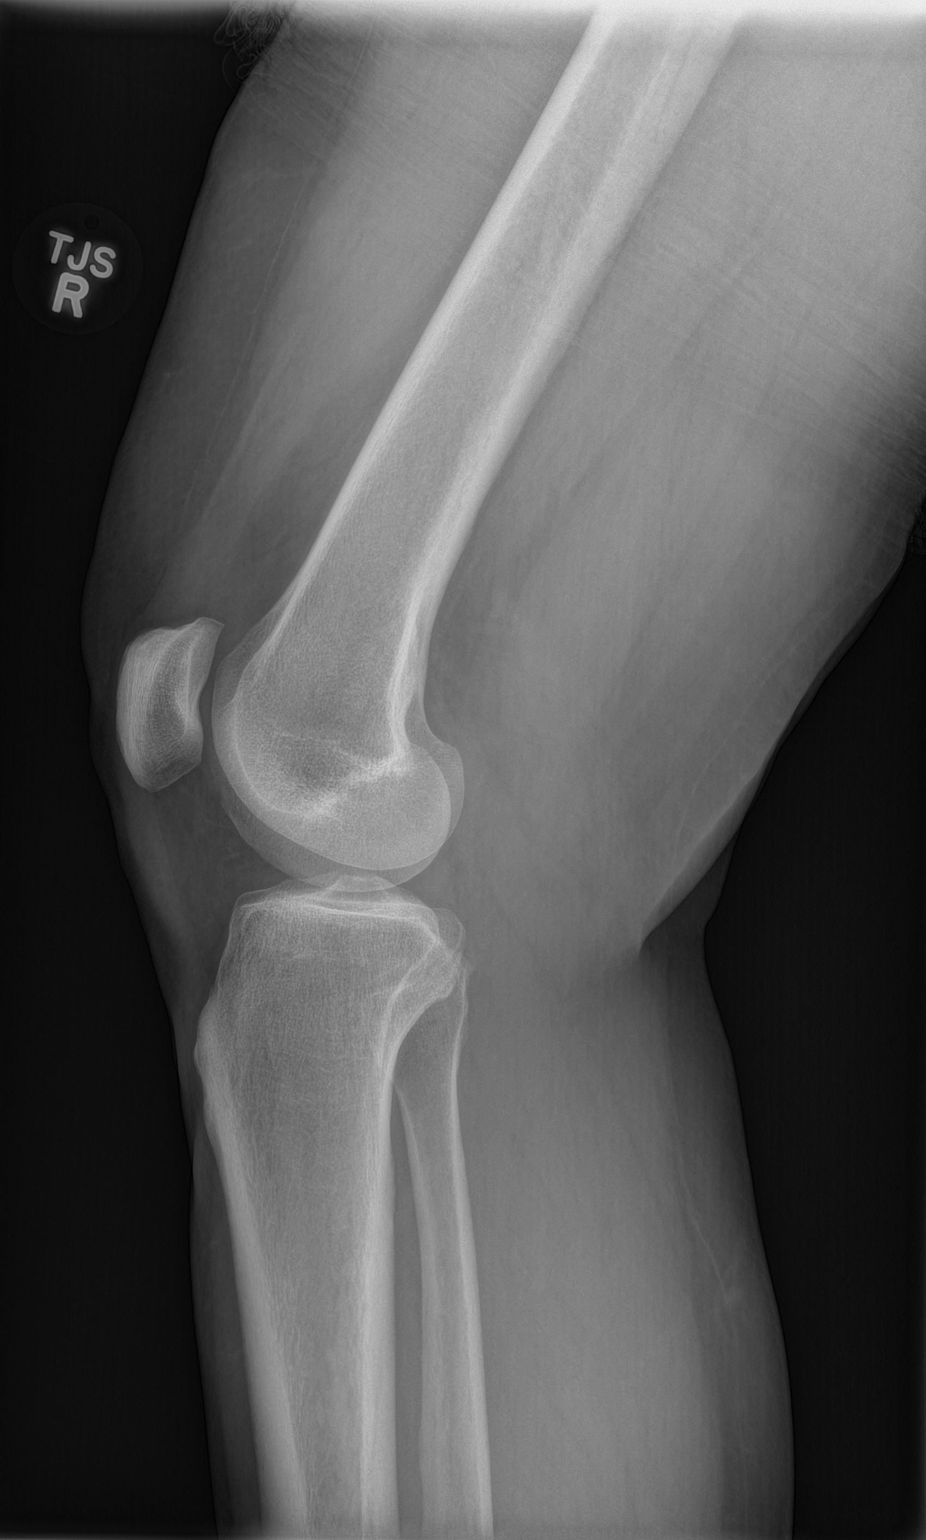
[im 3/4]
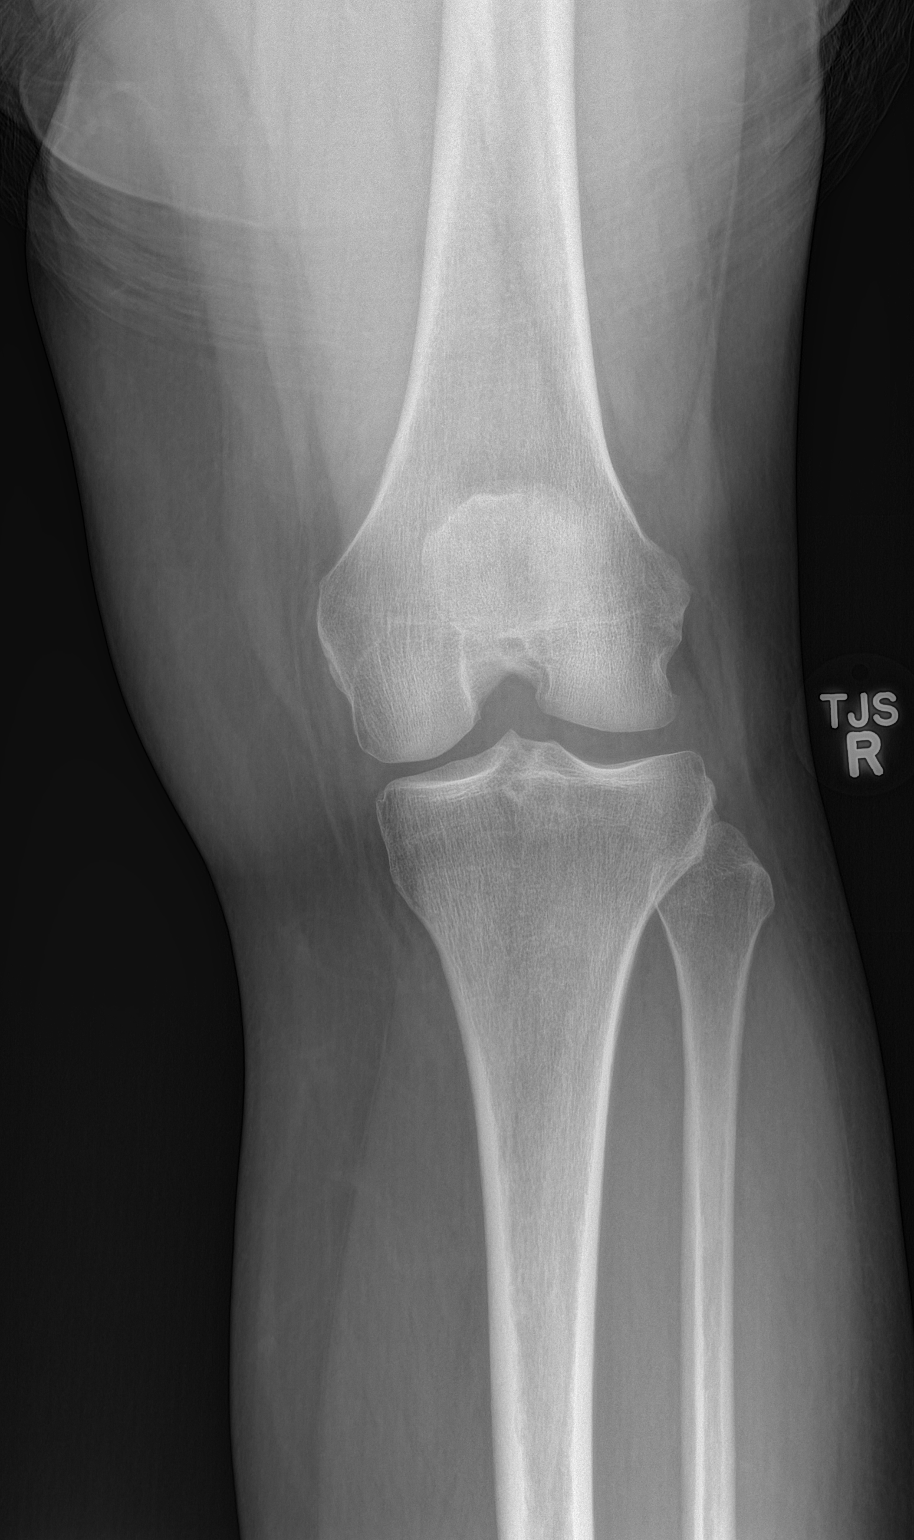
[im 4/4]
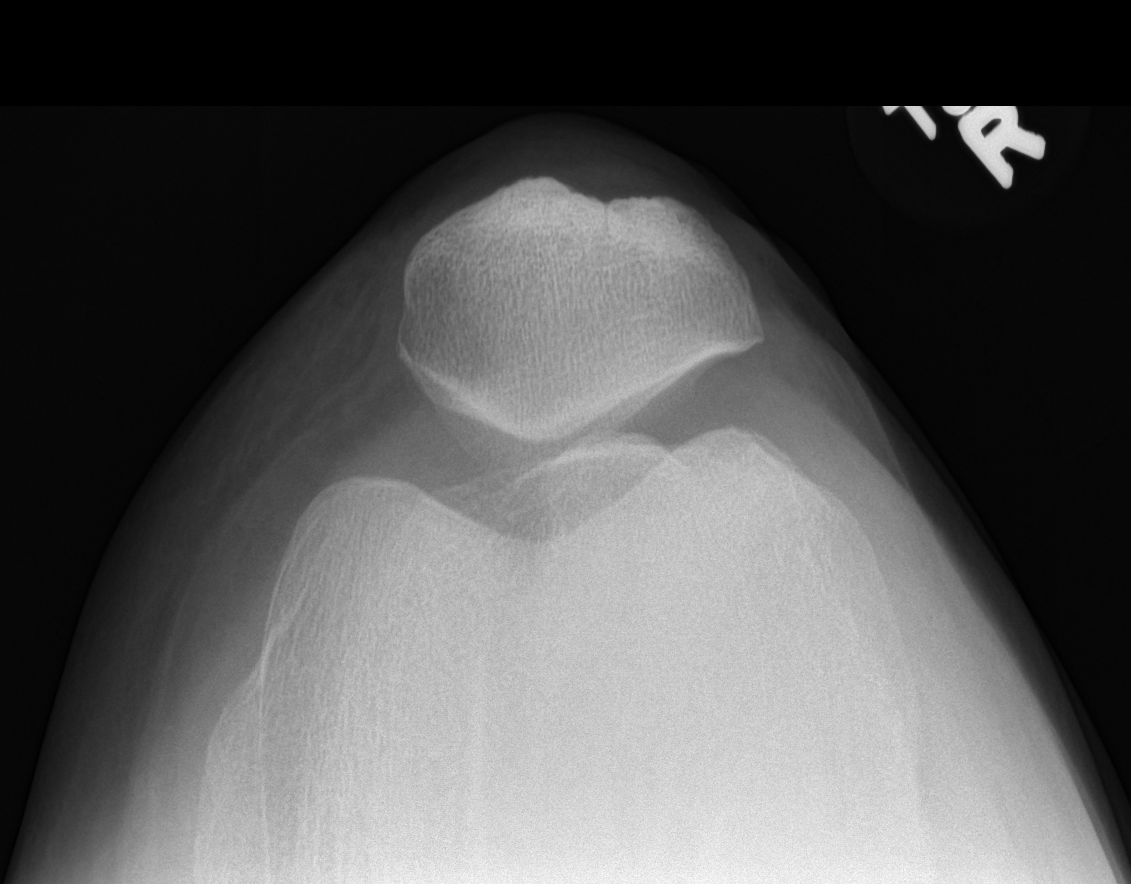

[4 of 4 positions shown; findings below may reference images not displayed]

FINDINGS: The right knee joint spaces are well preserved for age. No
significant degenerative change is seen. No fracture is noted, and
there is no evidence of joint space effusion. The patella is
normally positioned.
IMPRESSION: Negative.

## 2018-12-28 ENCOUNTER — Ambulatory Visit: Payer: BC Managed Care – PPO | Admitting: Physician Assistant

## 2018-12-28 ENCOUNTER — Encounter: Payer: Self-pay | Admitting: Physician Assistant

## 2018-12-28 ENCOUNTER — Other Ambulatory Visit: Payer: Self-pay

## 2018-12-28 VITALS — BP 125/85 | HR 78 | Temp 97.7°F | Resp 16 | Wt 204.4 lb

## 2018-12-28 DIAGNOSIS — M65331 Trigger finger, right middle finger: Secondary | ICD-10-CM

## 2018-12-28 DIAGNOSIS — G5601 Carpal tunnel syndrome, right upper limb: Secondary | ICD-10-CM | POA: Diagnosis not present

## 2018-12-28 NOTE — Patient Instructions (Signed)
Trigger Finger  Trigger finger (stenosing tenosynovitis) is a condition that causes a finger to get stuck in a bent position. Each finger has a tough, cord-like tissue that connects muscle to bone (tendon), and each tendon is surrounded by a tunnel of tissue (tendon sheath). To move your finger, your tendon needs to slide freely through the sheath. Trigger finger happens when the tendon or the sheath thickens, making it difficult to move your finger. Trigger finger can affect any finger or a thumb. It may affect more than one finger. Mild cases may clear up with rest and medicine. Severe cases require more treatment. What are the causes? Trigger finger is caused by a thickened finger tendon or tendon sheath. The cause of this thickening is not known. What increases the risk? The following factors may make you more likely to develop this condition:  Doing activities that require a strong grip.  Having rheumatoid arthritis, gout, or diabetes.  Being 61-27 years old.  Being a woman. What are the signs or symptoms? Symptoms of this condition include:  Pain when bending or straightening your finger.  Tenderness or swelling where your finger attaches to the palm of your hand.  A lump in the palm of your hand or on the inside of your finger.  Hearing a popping sound when you try to straighten your finger.  Feeling a popping, catching, or locking sensation when you try to straighten your finger.  Being unable to straighten your finger. How is this diagnosed? This condition is diagnosed based on your symptoms and a physical exam. How is this treated? This condition may be treated by:  Resting your finger and avoiding activities that make symptoms worse.  Wearing a finger splint to keep your finger in a slightly bent position.  Taking NSAIDs to relieve pain and swelling.  Injecting medicine (steroids) into the tendon sheath to reduce swelling and irritation. Injections may need to be  repeated.  Having surgery to open the tendon sheath. This may be done if other treatments do not work and you cannot straighten your finger. You may need physical therapy after surgery. Follow these instructions at home:   Use moist heat to help reduce pain and swelling as told by your health care provider.  Rest your finger and avoid activities that make pain worse. Return to normal activities as told by your health care provider.  If you have a splint, wear it as told by your health care provider.  Take over-the-counter and prescription medicines only as told by your health care provider.  Keep all follow-up visits as told by your health care provider. This is important. Contact a health care provider if:  Your symptoms are not improving with home care. Summary  Trigger finger (stenosing tenosynovitis) causes your finger to get stuck in a bent position, and it can make it difficult and painful to straighten your finger.  This condition develops when a finger tendon or tendon sheath thickens.  Treatment starts with resting, wearing a splint, and taking NSAIDs.  In severe cases, surgery to open the tendon sheath may be needed. This information is not intended to replace advice given to you by your health care provider. Make sure you discuss any questions you have with your health care provider. Document Released: 05/29/2004 Document Revised: 07/20/2016 Document Reviewed: 07/20/2016 Elsevier Interactive Patient Education  2019 [place] Syndrome  Carpal tunnel syndrome is a condition that causes pain in your hand and arm. The carpal tunnel  is a narrow area that is on the palm side of your wrist. Repeated wrist motion or certain diseases may cause swelling in the tunnel. This swelling can pinch the main nerve in the wrist (median nerve). What are the causes? This condition may be caused by:  Repeated wrist motions.  Wrist injuries.  Arthritis.  A sac of  fluid (cyst) or abnormal growth (tumor) in the carpal tunnel.  Fluid buildup during pregnancy. Sometimes the cause is not known. What increases the risk? The following factors may make you more likely to develop this condition:  Having a job in which you move your wrist in the same way many times. This includes jobs like being a Software engineer or a Scientist, water quality.  Being a woman.  Having other health conditions, such as: ? Diabetes. ? Obesity. ? A thyroid gland that is not active enough (hypothyroidism). ? Kidney failure. What are the signs or symptoms? Symptoms of this condition include:  A tingling feeling in your fingers.  Tingling or a loss of feeling (numbness) in your hand.  Pain in your entire arm. This pain may get worse when you bend your wrist and elbow for a long time.  Pain in your wrist that goes up your arm to your shoulder.  Pain that goes down into your palm or fingers.  A weak feeling in your hands. You may find it hard to grab and hold items. You may feel worse at night. How is this diagnosed? This condition is diagnosed with a medical history and physical exam. You may also have tests, such as:  Electromyogram (EMG). This test checks the signals that the nerves send to the muscles.  Nerve conduction study. This test checks how well signals pass through your nerves.  Imaging tests, such as X-rays, ultrasound, and MRI. These tests check for what might be the cause of your condition. How is this treated? This condition may be treated with:  Lifestyle changes. You will be asked to stop or change the activity that caused your problem.  Doing exercise and activities that make bones and muscles stronger (physical therapy).  Learning how to use your hand again (occupational therapy).  Medicines for pain and swelling (inflammation). You may have injections in your wrist.  A wrist splint.  Surgery. Follow these instructions at home: If you have a splint:  Wear the  splint as told by your doctor. Remove it only as told by your doctor.  Loosen the splint if your fingers: ? Tingle. ? Lose feeling (become numb). ? Turn cold and blue.  Keep the splint clean.  If the splint is not waterproof: ? Do not let it get wet. ? Cover it with a watertight covering when you take a bath or a shower. Managing pain, stiffness, and swelling   If told, put ice on the painful area: ? If you have a removable splint, remove it as told by your doctor. ? Put ice in a plastic bag. ? Place a towel between your skin and the bag. ? Leave the ice on for 20 minutes, 2-3 times per day. General instructions  Take over-the-counter and prescription medicines only as told by your doctor.  Rest your wrist from any activity that may cause pain. If needed, talk with your boss at work about changes that can help your wrist heal.  Do any exercises as told by your doctor, physical therapist, or occupational therapist.  Keep all follow-up visits as told by your doctor. This is important. Contact a  doctor if:  You have new symptoms.  Medicine does not help your pain.  Your symptoms get worse. Get help right away if:  You have very bad numbness or tingling in your wrist or hand. Summary  Carpal tunnel syndrome is a condition that causes pain in your hand and arm.  It is often caused by repeated wrist motions.  Lifestyle changes and medicines are used to treat this problem. Surgery may help in very bad cases.  Follow your doctor's instructions about wearing a splint, resting your wrist, keeping follow-up visits, and calling for help. This information is not intended to replace advice given to you by your health care provider. Make sure you discuss any questions you have with your health care provider. Document Released: 07/29/2011 Document Revised: 12/16/2017 Document Reviewed: 12/16/2017 Elsevier Interactive Patient Education  2019 [employer].

## 2018-12-28 NOTE — Progress Notes (Signed)
Patient: Amanda Day Female    DOB: 20-Aug-1966   53 y.o.   MRN: [id number] Visit Date: 12/28/2018  Today's Provider: Mar Daring, PA-C   Chief Complaint  Patient presents with  . Hand Pain   Subjective:     HPI  Patient here today with c/o right middle finger hurts. Reports no injury. Patient reports that it hurts in the morning and she is not able to open the middle finger and her hand has been falling asleep. She takes IBU. Warm compress makes it feel better.   No Known Allergies   Current Outpatient Medications:  .  amLODipine (NORVASC) 10 MG tablet, TAKE 1 TABLET(10 MG) BY MOUTH DAILY, Disp: 90 tablet, Rfl: 1 .  Cholecalciferol (VITAMIN D) 2000 units tablet, Take 1 tablet by mouth daily., Disp: , Rfl:  .  fluticasone (FLONASE) 50 MCG/ACT nasal spray, SHAKE LIQUID AND USE 2 SPRAYS IN EACH NOSTRIL DAILY AS NEEDED, Disp: 48 g, Rfl: 1 .  Glucosamine-Chondroitin-MSM 500-200-150 MG TABS, Take 1 tablet by mouth daily., Disp: , Rfl:  .  ibuprofen (ADVIL,MOTRIN) 200 MG tablet, Take 400 mg by mouth 3 (three) times daily as needed for headache or mild pain. , Disp: , Rfl:  .  loratadine (CLARITIN) 10 MG tablet, Take 10 mg by mouth daily. , Disp: , Rfl:  .  metoprolol succinate (TOPROL-XL) 50 MG 24 hr tablet, TAKE 1 TABLET BY MOUTH DAILY, Disp: 90 tablet, Rfl: 1 .  montelukast (SINGULAIR) 10 MG tablet, TAKE 1 TABLET(10 MG) BY MOUTH AT BEDTIME, Disp: 90 tablet, Rfl: 2 .  omeprazole (PRILOSEC) 10 MG capsule, Take 1 capsule by mouth as needed., Disp: , Rfl:  .  sertraline (ZOLOFT) 50 MG tablet, TAKE 1/2 TO 1 TABLET BY MOUTH EVERY DAY, Disp: 90 tablet, Rfl: 1  Review of Systems  Constitutional: Negative.   Respiratory: Negative.   Cardiovascular: Negative.   Gastrointestinal: Negative.   Musculoskeletal: Positive for arthralgias.  Neurological: Negative for weakness and numbness.    Social History   Tobacco Use  . Smoking status: Never Smoker  . Smokeless  tobacco: Never Used  Substance Use Topics  . Alcohol use: Yes    Comment: 4 nights a week, wine or beer      Objective:   BP 125/85 (BP Location: Left Arm, Patient Position: Sitting, Cuff Size: Large)   Pulse 78   Temp 97.7 F (36.5 C) (Oral)   Resp 16   Wt 204 lb 6.4 oz (92.7 kg)   LMP 05/23/2018   BMI 31.08 kg/m  Vitals:   12/28/18 1109  BP: 125/85  Pulse: 78  Resp: 16  Temp: 97.7 F (36.5 C)  TempSrc: Oral  Weight: 204 lb 6.4 oz (92.7 kg)     Physical Exam Vitals signs reviewed.  Constitutional:      General: She is not in acute distress.    Appearance: She is well-developed. She is not ill-appearing or diaphoretic.  Neck:     Musculoskeletal: Normal range of motion and neck supple.     Thyroid: No thyromegaly.     Vascular: No JVD.     Trachea: No tracheal deviation.  Cardiovascular:     Rate and Rhythm: Normal rate and regular rhythm.     Heart sounds: Normal heart sounds. No murmur. No friction rub. No gallop.   Pulmonary:     Effort: Pulmonary effort is normal. No respiratory distress.     Breath sounds: Normal breath  sounds. No wheezing or rales.  Musculoskeletal:     Right wrist: She exhibits normal range of motion, no tenderness and no bony tenderness.     Right hand: She exhibits decreased range of motion (middle finger with locking when flexed) and tenderness. She exhibits no bony tenderness, normal two-point discrimination, normal capillary refill, no deformity, no laceration and no swelling. Normal sensation noted. Normal strength noted.       Hands:     Comments: Positive phalen test, negative tinel sign  Lymphadenopathy:     Cervical: No cervical adenopathy.  Neurological:     Mental Status: She is alert.         Assessment & Plan    1. Trigger middle finger of right hand Continue NSAIDs and bracing as needed. Referral placed to ortho for evaluation and treatment options.  - Ambulatory referral to Orthopedic Surgery  2. Carpal tunnel  syndrome of right wrist See above medical treatment plan. - Ambulatory referral to [place], PA-C  [facility]

## 2019-01-05 ENCOUNTER — Other Ambulatory Visit: Payer: Self-pay | Admitting: Otolaryngology

## 2019-01-05 DIAGNOSIS — K118 Other diseases of salivary glands: Secondary | ICD-10-CM

## 2019-01-12 ENCOUNTER — Ambulatory Visit: Admission: RE | Admit: 2019-01-12 | Payer: BC Managed Care – PPO | Source: Ambulatory Visit

## 2019-01-21 ENCOUNTER — Other Ambulatory Visit: Payer: Self-pay | Admitting: Physician Assistant

## 2019-01-21 DIAGNOSIS — J309 Allergic rhinitis, unspecified: Secondary | ICD-10-CM

## 2019-01-21 DIAGNOSIS — F325 Major depressive disorder, single episode, in full remission: Secondary | ICD-10-CM

## 2019-01-21 DIAGNOSIS — I10 Essential (primary) hypertension: Secondary | ICD-10-CM

## 2019-02-06 ENCOUNTER — Ambulatory Visit
Admission: RE | Admit: 2019-02-06 | Discharge: 2019-02-06 | Disposition: A | Discharge status: Home / Self Care | Payer: BC Managed Care – PPO | Source: Ambulatory Visit | Attending: Otolaryngology | Admitting: Otolaryngology

## 2019-02-06 ENCOUNTER — Other Ambulatory Visit: Payer: Self-pay

## 2019-02-06 DIAGNOSIS — K118 Other diseases of salivary glands: Secondary | ICD-10-CM | POA: Insufficient documentation

## 2019-02-09 ENCOUNTER — Other Ambulatory Visit: Payer: Self-pay | Admitting: Otolaryngology

## 2019-02-09 DIAGNOSIS — K118 Other diseases of salivary glands: Secondary | ICD-10-CM

## 2019-02-19 ENCOUNTER — Other Ambulatory Visit: Payer: Self-pay | Admitting: Student

## 2019-02-20 ENCOUNTER — Other Ambulatory Visit: Payer: Self-pay

## 2019-02-20 ENCOUNTER — Ambulatory Visit
Admission: RE | Admit: 2019-02-20 | Discharge: 2019-02-20 | Disposition: A | Discharge status: Home / Self Care | Payer: BC Managed Care – PPO | Source: Ambulatory Visit | Attending: Otolaryngology | Admitting: Otolaryngology

## 2019-02-20 DIAGNOSIS — K118 Other diseases of salivary glands: Secondary | ICD-10-CM | POA: Diagnosis not present

## 2019-02-20 MED ORDER — SODIUM CHLORIDE 0.9 % IV SOLN: INTRAVENOUS | Status: DC

## 2019-02-20 NOTE — Progress Notes (Signed)
Pt stable after bx-VSS.HEENT-stable.D/C instructions given father/U with her MD

## 2019-02-20 NOTE — Discharge Instructions (Signed)
Needle Biopsy, Care After °These instructions tell you how to care for yourself after your procedure. Your doctor may also give you more specific instructions. Call your doctor if you have any problems or questions. °What can I expect after the procedure? °After the procedure, it is common to have: °· Soreness. °· Bruising. °· Mild pain. °Follow these instructions at home: ° °· Return to your normal activities as told by your doctor. Ask your doctor what activities are safe for you. °· Take over-the-counter and prescription medicines only as told by your doctor. °· Wash your hands with soap and water before you change your bandage (dressing). If you cannot use soap and water, use hand sanitizer. °· Follow instructions from your doctor about: °? How to take care of your puncture site. °? When and how to change your bandage. °? When to remove your bandage. °· Check your puncture site every day for signs of infection. Watch for: °? Redness, swelling, or pain. °? Fluid or blood.  °? Pus or a bad smell. °? Warmth. °· Do not take baths, swim, or use a hot tub until your doctor approves. Ask your doctor if you may take showers. You may only be allowed to take sponge baths. °· Keep all follow-up visits as told by your doctor. This is important. °Contact a doctor if you have: °· A fever. °· Redness, swelling, or pain at the puncture site, and it lasts longer than a few days. °· Fluid, blood, or pus coming from the puncture site. °· Warmth coming from the puncture site. °Get help right away if: °· You have a lot of bleeding from the puncture site. °Summary °· After the procedure, it is common to have soreness, bruising, or mild pain at the puncture site. °· Check your puncture site every day for signs of infection, such as redness, swelling, or pain. °· Get help right away if you have severe bleeding from your puncture site. °This information is not intended to replace advice given to you by your health care provider. Make  sure you discuss any questions you have with your health care provider. °Document Released: 07/22/2008 Document Revised: 08/22/2017 Document Reviewed: 08/22/2017 °Elsevier Patient Education © 2020 [employer] ° °

## 2019-02-21 LAB — CYTOLOGY - NON PAP

## 2019-02-27 ENCOUNTER — Other Ambulatory Visit: Payer: Self-pay | Admitting: Otolaryngology

## 2019-02-27 DIAGNOSIS — D37030 Neoplasm of uncertain behavior of the parotid salivary glands: Secondary | ICD-10-CM

## 2019-03-13 ENCOUNTER — Other Ambulatory Visit: Payer: Self-pay

## 2019-03-13 ENCOUNTER — Ambulatory Visit
Admission: RE | Admit: 2019-03-13 | Discharge: 2019-03-13 | Disposition: A | Discharge status: Home / Self Care | Payer: BC Managed Care – PPO | Source: Ambulatory Visit | Attending: Otolaryngology | Admitting: Otolaryngology

## 2019-03-13 DIAGNOSIS — D37030 Neoplasm of uncertain behavior of the parotid salivary glands: Secondary | ICD-10-CM | POA: Insufficient documentation

## 2019-03-13 MED ORDER — IOHEXOL 300 MG/ML  SOLN
75.0000 mL | Freq: Once | INTRAMUSCULAR | Status: AC | PRN
  Administered 2019-03-13: 13:00:00 75 mL via INTRAVENOUS

## 2019-03-25 ENCOUNTER — Other Ambulatory Visit: Payer: Self-pay | Admitting: Physician Assistant

## 2019-03-25 DIAGNOSIS — I10 Essential (primary) hypertension: Secondary | ICD-10-CM

## 2019-06-19 ENCOUNTER — Ambulatory Visit (INDEPENDENT_AMBULATORY_CARE_PROVIDER_SITE_OTHER): Payer: BC Managed Care – PPO | Admitting: Physician Assistant

## 2019-06-19 ENCOUNTER — Encounter: Payer: Self-pay | Admitting: Physician Assistant

## 2019-06-19 ENCOUNTER — Other Ambulatory Visit: Payer: Self-pay

## 2019-06-19 VITALS — BP 111/79 | HR 93 | Temp 97.1°F | Resp 18 | Ht 68.0 in | Wt 197.0 lb

## 2019-06-19 DIAGNOSIS — Z23 Encounter for immunization: Secondary | ICD-10-CM

## 2019-06-19 DIAGNOSIS — Z136 Encounter for screening for cardiovascular disorders: Secondary | ICD-10-CM

## 2019-06-19 DIAGNOSIS — E538 Deficiency of other specified B group vitamins: Secondary | ICD-10-CM

## 2019-06-19 DIAGNOSIS — Z6829 Body mass index (BMI) 29.0-29.9, adult: Secondary | ICD-10-CM

## 2019-06-19 DIAGNOSIS — Z13220 Encounter for screening for lipoid disorders: Secondary | ICD-10-CM

## 2019-06-19 DIAGNOSIS — I10 Essential (primary) hypertension: Secondary | ICD-10-CM | POA: Diagnosis not present

## 2019-06-19 DIAGNOSIS — Z0000 Encounter for general adult medical examination without abnormal findings: Secondary | ICD-10-CM

## 2019-06-19 NOTE — Progress Notes (Signed)
I, Bretlyn Ward ,CMA am acting as a scribe for [employer] PA-C    Patient: Amanda Day, Female    DOB: 1966/05/02, 53 y.o.   MRN: [id number] Visit Date: 06/19/2019  Today's Provider: Mar Daring, PA-C   Chief Complaint  Patient presents with  . Annual Exam   Subjective:     Annual physical exam Amanda Day is a 53 y.o. female who presents today for health maintenance and complete physical. She feels fairly well. She reports exercising in the morning with an online exercising program about 6000 to 9000/steps per day.. She reports she is sleeping fairly well. Does have some trouble when falling asleep.  ----------------------------------------------------------------- by Dalia Heading, CNM on 06/28/2018-Patient called with results of the Pap smear and the endometrial biopsy-both were negative or normal. 02/21/15 Colonoscopy:Repeat colonoscopy in 10 years for screening unless any change in family history or lower GI problems. 10/09/2018-Mammogram Normal-Repeat in 1 year.  Review of Systems  Constitutional: Negative.   HENT: Negative.   Eyes: Negative.   Respiratory: Negative.   Cardiovascular: Negative.   Gastrointestinal: Negative.   Endocrine: Negative.   Genitourinary: Negative.   Skin: Negative.   Allergic/Immunologic: Negative.   Neurological: Negative.   Hematological: Negative.   Psychiatric/Behavioral: Negative.     Social History      She  reports that she has never smoked. She has never used smokeless tobacco. She reports current alcohol use. She reports that she does not use drugs.       Social History   Socioeconomic History  . Marital status: Divorced    Spouse name: Not on file  . Number of children: 2  . Years of education: 33  . Highest education level: Not on file  Occupational History  . 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 Financial resource strain: Not on file  . Food  insecurity    Worry: Not on file    Inability: Not on file  . Transportation needs    Medical: Not on file    Non-medical: Not on file  Tobacco Use  . Smoking status: Never Smoker  . Smokeless tobacco: Never Used  Substance and Sexual Activity  . Alcohol use: Yes    Comment: 4 nights a week, wine or beer  . Drug use: No  . Sexual activity: Not Currently    Birth control/protection: Surgical  Lifestyle  . Physical activity    Days per week: Not on file    Minutes per session: Not on file  . Stress: Not on file  Relationships  . Social Herbalist on phone: Not on file    Gets together: Not on file    Attends religious service: Not on file    Active member of club or organization: Not on file    Attends meetings of clubs or organizations: Not on file    Relationship status: Not on file  Other Topics Concern  . Not on file  Social History Narrative  . Not on file    Past Medical History:  Diagnosis Date  . Anxiety   . Arthritis    right great toe  . GERD (gastroesophageal reflux disease)    OCC  . Hypertension   . Motion sickness    long car rides, amusement park rides  . Seasonal allergies      Patient Active Problem List   Diagnosis Date Noted  . S/P arthroscopy of left knee  11/21/2016  . Knee sprain 03/21/2015  . Special screening for malignant neoplasms, colon   . Adaptation reaction 12/26/2014  . Allergic rhinitis 12/26/2014  . Blood pressure elevated 12/26/2014  . Essential (primary) hypertension 12/26/2014  . Acid reflux 12/26/2014  . Avitaminosis D 12/26/2014    Past Surgical History:  Procedure Laterality Date  . COLONOSCOPY N/A 02/21/2015   Procedure: COLONOSCOPY;  Surgeon: Lucilla Lame, MD;  Location: [facility];  Service: Gastroenterology;  Laterality: N/A;  . KNEE ARTHROSCOPY Left 10/04/2016   Procedure: ARTHROSCOPY KNEE;  Surgeon: Dereck Leep, MD;  Location: [facility];  Service: Orthopedics;  Laterality: Left;  . KNEE  ARTHROSCOPY Right 04/06/2017   Procedure: ARTHROSCOPY KNEE, PARTIAL MEDIAL MENISCECTOMY, MEDIAL CHONDROPLASTY;  Surgeon: Dereck Leep, MD;  Location: [facility];  Service: Orthopedics;  Laterality: Right;  . 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 TUBAL LIGATION  2009    Family History        Family Status  Relation Name Status  . Mother  Alive  . Father  Deceased at age 17  . Sister  Alive  . Brother  Alive        Her family history includes Anxiety disorder in her mother; Arrhythmia in her father; Cancer in her father; Hypertension in her brother, father, mother, and sister; Mitral valve prolapse in her mother.      No Known Allergies   Current Outpatient Medications:  .  amLODipine (NORVASC) 10 MG tablet, TAKE 1 TABLET(10 MG) BY MOUTH DAILY, Disp: 90 tablet, Rfl: 1 .  Cholecalciferol (VITAMIN D) 2000 units tablet, Take 1 tablet by mouth daily., Disp: , Rfl:  .  fluticasone (FLONASE) 50 MCG/ACT nasal spray, SHAKE LIQUID AND USE 2 SPRAYS IN EACH NOSTRIL DAILY AS NEEDED, Disp: 48 g, Rfl: 1 .  Glucosamine-Chondroitin-MSM 500-200-150 MG TABS, Take 1 tablet by mouth daily., Disp: , Rfl:  .  loratadine (CLARITIN) 10 MG tablet, Take 10 mg by mouth daily. , Disp: , Rfl:  .  metoprolol succinate (TOPROL-XL) 50 MG 24 hr tablet, TAKE 1 TABLET BY MOUTH EVERY DAY, Disp: 90 tablet, Rfl: 1 .  montelukast (SINGULAIR) 10 MG tablet, TAKE 1 TABLET(10 MG) BY MOUTH AT BEDTIME, Disp: 90 tablet, Rfl: 2 .  omeprazole (PRILOSEC) 10 MG capsule, Take 1 capsule by mouth as needed., Disp: , Rfl:  .  pyridoxine (B-6) 100 MG tablet, Take 100 mg by mouth daily., Disp: , Rfl:  .  sertraline (ZOLOFT) 50 MG tablet, TAKE 1/2 TO 1 TABLET BY MOUTH EVERY DAY, Disp: 90 tablet, Rfl: 1 .  vitamin B-12 (CYANOCOBALAMIN) 1000 MCG tablet, Take 1,000 mcg by mouth daily., Disp: , Rfl:  .  ibuprofen (ADVIL,MOTRIN) 200 MG tablet, Take 400 mg by mouth 3 (three) times daily as needed for  headache or mild pain. , Disp: , Rfl:    Patient Care Team: Rubye Beach as PCP - General (Family Medicine)    Objective:    Vitals: BP 111/79   Pulse 93   Temp (!) 97.1 F (36.2 C) (Temporal)   Resp 18   Ht 5\' 8"  (1.727 m)   Wt 197 lb (89.4 kg)   LMP 05/23/2018   SpO2 97%   BMI 29.95 kg/m    Vitals:   06/19/19 1423  BP: 111/79  Pulse: 93  Resp: 18  Temp: (!) 97.1 F (36.2 C)  TempSrc: Temporal  SpO2: 97%  Weight:  197 lb (89.4 kg)  Height: 5\' 8"  (1.727 m)     Physical Exam Vitals signs reviewed.  Constitutional:      General: She is not in acute distress.    Appearance: Normal appearance. She is well-developed and normal weight. She is not ill-appearing or diaphoretic.  HENT:     Head: Normocephalic and atraumatic.     Right Ear: Tympanic membrane, ear canal and external ear normal.     Left Ear: Tympanic membrane, ear canal and external ear normal.     Nose: Nose normal. No congestion.     Mouth/Throat:     Mouth: Mucous membranes are moist.     Pharynx: Oropharynx is clear. No oropharyngeal exudate.  Eyes:     General: No scleral icterus.       Right eye: No discharge.        Left eye: No discharge.     Extraocular Movements: Extraocular movements intact.     Conjunctiva/sclera: Conjunctivae normal.     Pupils: Pupils are equal, round, and reactive to light.  Neck:     Musculoskeletal: Normal range of motion and neck supple.     Thyroid: No thyromegaly.     Vascular: No carotid bruit or JVD.     Trachea: No tracheal deviation.  Cardiovascular:     Rate and Rhythm: Normal rate and regular rhythm.     Pulses: Normal pulses.     Heart sounds: Normal heart sounds. No murmur. No friction rub. No gallop.   Pulmonary:     Effort: Pulmonary effort is normal. No respiratory distress.     Breath sounds: Normal breath sounds. No wheezing or rales.  Chest:     Chest wall: No tenderness.  Abdominal:     General: Abdomen is flat. Bowel sounds are  normal. There is no distension.     Palpations: Abdomen is soft. There is no mass.     Tenderness: There is no abdominal tenderness. There is no guarding or rebound.  Musculoskeletal: Normal range of motion.        General: No tenderness.     Right lower leg: No edema.     Left lower leg: No edema.  Lymphadenopathy:     Cervical: No cervical adenopathy.  Skin:    General: Skin is warm and dry.     Capillary Refill: Capillary refill takes less than 2 seconds.     Findings: No rash.  Neurological:     General: No focal deficit present.     Mental Status: She is alert and oriented to person, place, and time. Mental status is at baseline.  Psychiatric:        Mood and Affect: Mood normal.        Behavior: Behavior normal.        Thought Content: Thought content normal.        Judgment: Judgment normal.      Depression Screen PHQ 2/9 Scores 06/19/2019 06/07/2018 07/28/2017 10/29/2015  PHQ - 2 Score 1 0 0 1  PHQ- 9 Score 2 2 - -       Assessment & Plan:     Routine Health Maintenance and Physical Exam  Exercise Activities and Dietary recommendations Goals   None     Immunization History  Administered Date(s) Administered  . Influenza,inj,Quad PF,6+ Mos 05/10/2013, 05/24/2015, 07/28/2017, 06/07/2018  . Tdap 03/24/2011    Health Maintenance  Topic Date Due  . MAMMOGRAM  10/10/2019  . TETANUS/TDAP  03/23/2021  .  PAP SMEAR-Modifier  06/21/2021  . COLONOSCOPY  02/20/2025  . INFLUENZA VACCINE  Completed  . HIV Screening  Completed     Discussed health benefits of physical activity, and encouraged her to engage in regular exercise appropriate for her age and condition.    1. Annual physical exam Normal physical exam today. Will check labs as below and f/u pending lab results. If labs are stable and WNL she will not need to have these rechecked for one year at her next annual physical exam. She is to call the office in the meantime if she has any acute issue, questions  or concerns. - CBC with Differential/Platelet - Comprehensive metabolic panel - Hgb 123456 w/o eAG - TSH - Lipid panel - Vitamin B12  2. Essential (primary) hypertension Continue amlodipine 10mg , metoprolol XR 50mg . Will check labs as below and f/u pending results. - CBC with Differential/Platelet - Comprehensive metabolic panel - Hgb 123456 w/o eAG - TSH - Lipid panel  3. Encounter for lipid screening for cardiovascular disease Diet controlled. Will check labs as below and f/u pending results. - CBC with Differential/Platelet - Comprehensive metabolic panel - Hgb 123456 w/o eAG - TSH - Lipid panel  4. B12 deficiency Will check labs as below and f/u pending results. - Vitamin B12  5. BMI 29.0-29.9,adult Counseled patient on healthy lifestyle modifications including dieting and exercise.   6. Need for influenza vaccination Flu vaccine given today without complication. Patient sat upright for 15 minutes to check for adverse reaction before being released. - Flu Vaccine QUAD 6+ mos PF IM (Fluarix Quad PF)  7. Need for shingles vaccine Shingrix Vaccine #1 given to patient without complications. Patient sat for 15 minutes after administration and was tolerated well without adverse effects. Return in 2 months for #2.  - Varicella-zoster vaccine IM (Shingrix)  --------------------------------------------------------------------    Mar Daring, PA-C  [place]

## 2019-06-19 NOTE — Patient Instructions (Signed)
Voltaren gel for knee pain  Health Maintenance for Postmenopausal Women Menopause is a normal process in which your ability to get pregnant comes to an end. This process happens slowly over many months or years, usually between the ages of 46 and 79. Menopause is complete when you have missed your menstrual periods for 12 months. It is important to talk with your health care provider about some of the most common conditions that affect women after menopause (postmenopausal women). These include heart disease, cancer, and bone loss (osteoporosis). Adopting a healthy lifestyle and getting preventive care can help to promote your health and wellness. The actions you take can also lower your chances of developing some of these common conditions. What should I know about menopause? During menopause, you may get a number of symptoms, such as:  Hot flashes. These can be moderate or severe.  Night sweats.  Decrease in sex drive.  Mood swings.  Headaches.  Tiredness.  Irritability.  Memory problems.  Insomnia. Choosing to treat or not to treat these symptoms is a decision that you make with your health care provider. Do I need hormone replacement therapy?  Hormone replacement therapy is effective in treating symptoms that are caused by menopause, such as hot flashes and night sweats.  Hormone replacement carries certain risks, especially as you become older. If you are thinking about using estrogen or estrogen with progestin, discuss the benefits and risks with your health care provider. What is my risk for heart disease and stroke? The risk of heart disease, heart attack, and stroke increases as you age. One of the causes may be a change in the body's hormones during menopause. This can affect how your body uses dietary fats, triglycerides, and cholesterol. Heart attack and stroke are medical emergencies. There are many things that you can do to help prevent heart disease and stroke. Watch  your blood pressure  High blood pressure causes heart disease and increases the risk of stroke. This is more likely to develop in people who have high blood pressure readings, are of African descent, or are overweight.  Have your blood pressure checked: ? Every 3-5 years if you are 50-1 years of age. ? Every year if you are 26 years old or older. Eat a healthy diet   Eat a diet that includes plenty of vegetables, fruits, low-fat dairy products, and lean protein.  Do not eat a lot of foods that are high in solid fats, added sugars, or sodium. Get regular exercise Get regular exercise. This is one of the most important things you can do for your health. Most adults should:  Try to exercise for at least 150 minutes each week. The exercise should increase your heart rate and make you sweat (moderate-intensity exercise).  Try to do strengthening exercises at least twice each week. Do these in addition to the moderate-intensity exercise.  Spend less time sitting. Even light physical activity can be beneficial. Other tips  Work with your health care provider to achieve or maintain a healthy weight.  Do not use any products that contain nicotine or tobacco, such as cigarettes, e-cigarettes, and chewing tobacco. If you need help quitting, ask your health care provider.  Know your numbers. Ask your health care provider to check your cholesterol and your blood sugar (glucose). Continue to have your blood tested as directed by your health care provider. Do I need screening for cancer? Depending on your health history and family history, you may need to have cancer screening at  different stages of your life. This may include screening for:  Breast cancer.  Cervical cancer.  Lung cancer.  Colorectal cancer. What is my risk for osteoporosis? After menopause, you may be at increased risk for osteoporosis. Osteoporosis is a condition in which bone destruction happens more quickly than new bone  creation. To help prevent osteoporosis or the bone fractures that can happen because of osteoporosis, you may take the following actions:  If you are 50-18 years old, get at least 1,000 mg of calcium and at least 600 mg of vitamin D per day.  If you are older than age 42 but younger than age 23, get at least 1,200 mg of calcium and at least 600 mg of vitamin D per day.  If you are older than age 28, get at least 1,200 mg of calcium and at least 800 mg of vitamin D per day. Smoking and drinking excessive alcohol increase the risk of osteoporosis. Eat foods that are rich in calcium and vitamin D, and do weight-bearing exercises several times each week as directed by your health care provider. How does menopause affect my mental health? Depression may occur at any age, but it is more common as you become older. Common symptoms of depression include:  Low or sad mood.  Changes in sleep patterns.  Changes in appetite or eating patterns.  Feeling an overall lack of motivation or enjoyment of activities that you previously enjoyed.  Frequent crying spells. Talk with your health care provider if you think that you are experiencing depression. General instructions See your health care provider for regular wellness exams and vaccines. This may include:  Scheduling regular health, dental, and eye exams.  Getting and maintaining your vaccines. These include: ? Influenza vaccine. Get this vaccine each year before the flu season begins. ? Pneumonia vaccine. ? Shingles vaccine. ? Tetanus, diphtheria, and pertussis (Tdap) booster vaccine. Your health care provider may also recommend other immunizations. Tell your health care provider if you have ever been abused or do not feel safe at home. Summary  Menopause is a normal process in which your ability to get pregnant comes to an end.  This condition causes hot flashes, night sweats, decreased interest in sex, mood swings, headaches, or lack of  sleep.  Treatment for this condition may include hormone replacement therapy.  Take actions to keep yourself healthy, including exercising regularly, eating a healthy diet, watching your weight, and checking your blood pressure and blood sugar levels.  Get screened for cancer and depression. Make sure that you are up to date with all your vaccines. This information is not intended to replace advice given to you by your health care provider. Make sure you discuss any questions you have with your health care provider. Document Released: 10/01/2005 Document Revised: 08/02/2018 Document Reviewed: 08/02/2018 Elsevier Patient Education  2020 [employer].

## 2019-06-20 ENCOUNTER — Telehealth: Payer: Self-pay

## 2019-06-20 LAB — CBC WITH DIFFERENTIAL/PLATELET
Basophils Absolute: 0 10*3/uL (ref 0.0–0.2)
Basos: 0 %
EOS (ABSOLUTE): 0.1 10*3/uL (ref 0.0–0.4)
Eos: 1 %
Hematocrit: 38.4 % (ref 34.0–46.6)
Hemoglobin: 12.8 g/dL (ref 11.1–15.9)
Immature Grans (Abs): 0 10*3/uL (ref 0.0–0.1)
Immature Granulocytes: 0 %
Lymphocytes Absolute: 1.7 10*3/uL (ref 0.7–3.1)
Lymphs: 29 %
MCH: 30.3 pg (ref 26.6–33.0)
MCHC: 33.3 g/dL (ref 31.5–35.7)
MCV: 91 fL (ref 79–97)
Monocytes Absolute: 0.4 10*3/uL (ref 0.1–0.9)
Monocytes: 7 %
Neutrophils Absolute: 3.7 10*3/uL (ref 1.4–7.0)
Neutrophils: 63 %
Platelets: 287 10*3/uL (ref 150–450)
RBC: 4.23 x10E6/uL (ref 3.77–5.28)
RDW: 12.7 % (ref 11.7–15.4)
WBC: 5.8 10*3/uL (ref 3.4–10.8)

## 2019-06-20 LAB — COMPREHENSIVE METABOLIC PANEL
ALT: 25 IU/L (ref 0–32)
AST: 24 IU/L (ref 0–40)
Albumin/Globulin Ratio: 2 (ref 1.2–2.2)
Albumin: 4.3 g/dL (ref 3.8–4.9)
Alkaline Phosphatase: 79 IU/L (ref 39–117)
BUN/Creatinine Ratio: 23 (ref 9–23)
BUN: 19 mg/dL (ref 6–24)
Bilirubin Total: 0.2 mg/dL (ref 0.0–1.2)
CO2: 24 mmol/L (ref 20–29)
Calcium: 9.4 mg/dL (ref 8.7–10.2)
Chloride: 102 mmol/L (ref 96–106)
Creatinine, Ser: 0.81 mg/dL (ref 0.57–1.00)
GFR calc Af Amer: 95 mL/min/{1.73_m2} (ref >59)
GFR calc non Af Amer: 83 mL/min/{1.73_m2} (ref >59)
Globulin, Total: 2.2 g/dL (ref 1.5–4.5)
Glucose: 93 mg/dL (ref 65–99)
Potassium: 4 mmol/L (ref 3.5–5.2)
Sodium: 141 mmol/L (ref 134–144)
Total Protein: 6.5 g/dL (ref 6.0–8.5)

## 2019-06-20 LAB — TSH: TSH: 1.32 u[IU]/mL (ref 0.450–4.500)

## 2019-06-20 LAB — VITAMIN B12: Vitamin B-12: 1258 pg/mL — ABNORMAL HIGH (ref 232–1245)

## 2019-06-20 LAB — LIPID PANEL
Chol/HDL Ratio: 4.4 ratio (ref 0.0–4.4)
Cholesterol, Total: 224 mg/dL — ABNORMAL HIGH (ref 100–199)
HDL: 51 mg/dL (ref >39)
LDL Chol Calc (NIH): 118 mg/dL — ABNORMAL HIGH (ref 0–99)
Triglycerides: 317 mg/dL — ABNORMAL HIGH (ref 0–149)
VLDL Cholesterol Cal: 55 mg/dL — ABNORMAL HIGH (ref 5–40)

## 2019-06-20 LAB — HGB A1C W/O EAG: Hgb A1c MFr Bld: 5.2 % (ref 4.8–5.6)

## 2019-06-20 NOTE — Telephone Encounter (Signed)
-----   Message from Mar Daring, [place] sent at 06/20/2019  8:51 AM EDT ----- Blood count is normal. Kidney and liver function are normal. Sodium, potassium and calcium are normal. A1c/sugar are normal. Thyroid is normal. Cholesterol remains borderline high but has improved since last check. B12 is normal.

## 2019-06-20 NOTE — Telephone Encounter (Signed)
Patient has been advised. KW 

## 2019-07-13 ENCOUNTER — Other Ambulatory Visit: Payer: Self-pay

## 2019-07-13 ENCOUNTER — Encounter: Payer: Self-pay | Admitting: Family Medicine

## 2019-07-13 ENCOUNTER — Ambulatory Visit: Payer: BC Managed Care – PPO | Admitting: Family Medicine

## 2019-07-13 VITALS — BP 120/86 | HR 96 | Temp 96.9°F | Resp 16 | Wt 199.8 lb

## 2019-07-13 DIAGNOSIS — H00014 Hordeolum externum left upper eyelid: Secondary | ICD-10-CM | POA: Diagnosis not present

## 2019-07-13 NOTE — Progress Notes (Signed)
Patient: Amanda Day Female    DOB: May 07, 1966   53 y.o.   MRN: [id number] Visit Date: 07/13/2019  Today's Provider: Lavon Paganini, MD   Chief Complaint  Patient presents with  . Eye Problem   Subjective:     HPI Patient here today C/O left eyelid swelling x's 1 day.  Woke up with it like this this morning.  Patient denies any pain or discharge. Patient reports she has used warm compresses, reports no help with swelling. No fever.  Thinks that she had something similar several years ago and thought it was from eye makeup.  She has not been wearing eye makeup recently   No Known Allergies   Current Outpatient Medications:  .  amLODipine (NORVASC) 10 MG tablet, TAKE 1 TABLET(10 MG) BY MOUTH DAILY, Disp: 90 tablet, Rfl: 1 .  Cholecalciferol (VITAMIN D) 2000 units tablet, Take 1 tablet by mouth daily., Disp: , Rfl:  .  fluticasone (FLONASE) 50 MCG/ACT nasal spray, SHAKE LIQUID AND USE 2 SPRAYS IN EACH NOSTRIL DAILY AS NEEDED, Disp: 48 g, Rfl: 1 .  Glucosamine-Chondroitin-MSM 500-200-150 MG TABS, Take 1 tablet by mouth daily., Disp: , Rfl:  .  ibuprofen (ADVIL,MOTRIN) 200 MG tablet, Take 400 mg by mouth 3 (three) times daily as needed for headache or mild pain. , Disp: , Rfl:  .  loratadine (CLARITIN) 10 MG tablet, Take 10 mg by mouth daily. , Disp: , Rfl:  .  metoprolol succinate (TOPROL-XL) 50 MG 24 hr tablet, TAKE 1 TABLET BY MOUTH EVERY DAY, Disp: 90 tablet, Rfl: 1 .  montelukast (SINGULAIR) 10 MG tablet, TAKE 1 TABLET(10 MG) BY MOUTH AT BEDTIME, Disp: 90 tablet, Rfl: 2 .  omeprazole (PRILOSEC) 10 MG capsule, Take 1 capsule by mouth as needed., Disp: , Rfl:  .  pyridoxine (B-6) 100 MG tablet, Take 100 mg by mouth daily., Disp: , Rfl:  .  sertraline (ZOLOFT) 50 MG tablet, TAKE 1/2 TO 1 TABLET BY MOUTH EVERY DAY, Disp: 90 tablet, Rfl: 1 .  vitamin B-12 (CYANOCOBALAMIN) 1000 MCG tablet, Take 1,000 mcg by mouth daily., Disp: , Rfl:   Review of Systems   Constitutional: Negative.   Eyes:       Left eyelid swollen  Respiratory: Negative.   Cardiovascular: Negative.     Social History   Tobacco Use  . Smoking status: Never Smoker  . Smokeless tobacco: Never Used  Substance Use Topics  . Alcohol use: Yes    Comment: 4 nights a week, wine or beer      Objective:   BP 120/86 (BP Location: Left Arm, Patient Position: Sitting, Cuff Size: Large)   Pulse 96   Temp (!) 96.9 F (36.1 C) (Temporal)   Resp 16   Wt 199 lb 12.8 oz (90.6 kg)   LMP 05/23/2018   BMI 30.38 kg/m  Vitals:   07/13/19 1411  BP: 120/86  Pulse: 96  Resp: 16  Temp: (!) 96.9 F (36.1 C)  TempSrc: Temporal  Weight: 199 lb 12.8 oz (90.6 kg)  Body mass index is 30.38 kg/m.   Physical Exam Vitals signs reviewed.  Constitutional:      General: She is not in acute distress.    Appearance: Normal appearance. She is well-developed. She is not diaphoretic.  HENT:     Head: Normocephalic and atraumatic.  Eyes:     General: No scleral icterus.       Right eye: No discharge.  Left eye: No discharge.     Extraocular Movements: Extraocular movements intact.     Conjunctiva/sclera: Conjunctivae normal.     Pupils: Pupils are equal, round, and reactive to light.     Comments: L upper eyelid with small stye, erythema and swelling  Neck:     Musculoskeletal: Neck supple.     Thyroid: No thyromegaly.  Cardiovascular:     Rate and Rhythm: Normal rate and regular rhythm.     Pulses: Normal pulses.     Heart sounds: Normal heart sounds. No murmur.  Pulmonary:     Effort: Pulmonary effort is normal. No respiratory distress.     Breath sounds: Normal breath sounds. No wheezing, rhonchi or rales.  Musculoskeletal:     Right lower leg: No edema.     Left lower leg: No edema.  Lymphadenopathy:     Cervical: No cervical adenopathy.  Skin:    General: Skin is warm and dry.     Capillary Refill: Capillary refill takes less than 2 seconds.     Findings: No  rash.  Neurological:     Mental Status: She is alert and oriented to person, place, and time. Mental status is at baseline.  Psychiatric:        Mood and Affect: Mood normal.        Behavior: Behavior normal.      No results found for any visits on 07/13/19.     Assessment & Plan   1. Hordeolum externum of left upper eyelid - new problem - no evidence of cellulitis - will continue warm compresses and gentle cleansing - discussed strict return precautions   Return if symptoms worsen or fail to improve.   The entirety of the information documented in the History of Present Illness, Review of Systems and Physical Exam were personally obtained by me. Portions of this information were initially documented by Lynford Humphrey, CMA and reviewed by me for thoroughness and accuracy.    , Dionne Bucy, MD MPH [facility]

## 2019-07-13 NOTE — Patient Instructions (Signed)

## 2019-07-21 ENCOUNTER — Other Ambulatory Visit: Payer: Self-pay | Admitting: Physician Assistant

## 2019-07-21 DIAGNOSIS — F325 Major depressive disorder, single episode, in full remission: Secondary | ICD-10-CM

## 2019-07-21 DIAGNOSIS — I10 Essential (primary) hypertension: Secondary | ICD-10-CM

## 2019-07-21 DIAGNOSIS — J309 Allergic rhinitis, unspecified: Secondary | ICD-10-CM

## 2019-08-08 ENCOUNTER — Encounter: Payer: Self-pay | Admitting: Physician Assistant

## 2019-08-10 ENCOUNTER — Ambulatory Visit: Payer: BC Managed Care – PPO | Attending: Internal Medicine

## 2019-08-10 ENCOUNTER — Other Ambulatory Visit: Payer: Self-pay

## 2019-08-10 DIAGNOSIS — Z20822 Contact with and (suspected) exposure to covid-19: Secondary | ICD-10-CM

## 2019-08-11 LAB — NOVEL CORONAVIRUS, NAA: SARS-CoV-2, NAA: NOT DETECTED

## 2019-10-11 ENCOUNTER — Ambulatory Visit: Payer: BC Managed Care – PPO | Admitting: Physician Assistant

## 2019-10-15 ENCOUNTER — Other Ambulatory Visit: Payer: Self-pay

## 2019-10-15 ENCOUNTER — Ambulatory Visit (INDEPENDENT_AMBULATORY_CARE_PROVIDER_SITE_OTHER): Payer: BC Managed Care – PPO | Admitting: Physician Assistant

## 2019-10-15 DIAGNOSIS — Z23 Encounter for immunization: Secondary | ICD-10-CM

## 2019-10-17 ENCOUNTER — Other Ambulatory Visit: Payer: Self-pay | Admitting: Physician Assistant

## 2019-10-17 DIAGNOSIS — I10 Essential (primary) hypertension: Secondary | ICD-10-CM

## 2019-10-29 ENCOUNTER — Encounter: Payer: Self-pay | Admitting: Physician Assistant

## 2019-10-29 LAB — HM MAMMOGRAPHY

## 2020-01-16 ENCOUNTER — Other Ambulatory Visit: Payer: Self-pay | Admitting: Physician Assistant

## 2020-01-16 DIAGNOSIS — J309 Allergic rhinitis, unspecified: Secondary | ICD-10-CM

## 2020-01-16 DIAGNOSIS — I10 Essential (primary) hypertension: Secondary | ICD-10-CM

## 2020-04-15 ENCOUNTER — Other Ambulatory Visit: Payer: Self-pay | Admitting: Physician Assistant

## 2020-04-15 DIAGNOSIS — I10 Essential (primary) hypertension: Secondary | ICD-10-CM

## 2020-04-15 DIAGNOSIS — J309 Allergic rhinitis, unspecified: Secondary | ICD-10-CM

## 2020-04-15 DIAGNOSIS — F325 Major depressive disorder, single episode, in full remission: Secondary | ICD-10-CM

## 2020-04-15 NOTE — Telephone Encounter (Signed)
Requested medications are due for refill today?  Yes  Requested medications are on active medication list? Yes  Last Refill:    Amlodipine  01/16/2020  # 90 with no refills  Sertraline   07/21/2019 # 90 with one refill  Metoprolol  10/17/2019  # 90 with no refills   Future visit scheduled?  No   Notes to Clinic:  Medication failed Rx refill due to no valid encounter in the past 6 months.  Last office visit was 9 months ago.  Last CPE was 10 months ago.

## 2020-04-15 NOTE — Telephone Encounter (Signed)
Requested Prescriptions  Pending Prescriptions Disp Refills  . metoprolol succinate (TOPROL-XL) 50 MG 24 hr tablet [Pharmacy Med Name: METOPROLOL ER SUCCINATE 50MG  TABS] 90 tablet 1    Sig: TAKE 1 TABLET BY MOUTH EVERY DAY     Cardiovascular:  Beta Blockers Failed - 04/15/2020  6:20 AM      Failed - Valid encounter within last 6 months    Recent Outpatient Visits          9 months ago Hordeolum externum of left upper eyelid   [facility] [place], Dionne Bucy, [place]   10 months ago Annual physical exam   [place], Clearnce Sorrel, [place]   1 year ago Trigger middle finger of right hand   [place], Clearnce Sorrel, [place]   1 year ago Annual physical exam   [facility] [place] Malling M, [place]   2 years ago Essential (primary) hypertension   [facility] F[place] Malling M, [place]             Passed - Last BP in normal range    BP Readings from Last 1 Encounters:  07/13/19 120/86         Passed - Last Heart Rate in normal range    Pulse Readings from Last 1 Encounters:  07/13/19 96         . sertraline (ZOLOFT) 50 MG tablet [Pharmacy Med Name: SERTRALINE 50MG  TABLETS] 90 tablet 1    Sig: TAKE 1/2 TO 1 TABLET BY MOUTH EVERY DAY     Psychiatry:  Antidepressants - SSRI Failed - 04/15/2020  6:20 AM      Failed - Valid encounter within last 6 months    Recent Outpatient Visits          9 months ago Hordeolum externum of left upper eyelid   [facility] [place], Dionne Bucy, MD   10 months ago Annual physical exam   [place], Clearnce Sorrel, [place]   1 year ago Trigger middle finger of right hand   [place], Clearnce Sorrel, [place]   1 year ago Annual physical exam   [facility] Fenton Malling M, [place]   2 years ago Essential (primary) hypertension   [employer], Anderson Malta M, PA-C              . fluticasone (FLONASE) 50 MCG/ACT nasal spray [Pharmacy Med Name: FLUTICASONE 50MCG NASAL SP (120) RX] 48 g 0    Sig: SHAKE LIQUID AND USE 2 SPRAYS IN EACH NOSTRIL DAILY AS NEEDED     Ear, Nose, and Throat: Nasal Preparations - Corticosteroids Passed - 04/15/2020  6:20 AM      Passed - Valid encounter within last 12 months    Recent Outpatient Visits          9 months ago Hordeolum externum of left upper eyelid   [facility] [place], Dionne Bucy, MD   10 months ago Annual physical exam   [facility] [place], Clearnce Sorrel, [place]   1 year ago Trigger middle finger of right hand   [place], Clearnce Sorrel, [place]   1 year ago Annual physical exam   [place], Clearnce Sorrel, [place]   2 years ago Essential (primary) hypertension   [place], Jennifer M, PA-C             . amLODipine (NORVASC) 10 MG tablet [place] [facility]  Name: AMLODIPINE BESYLATE 10MG  TABLETS] 90 tablet 0    Sig: TAKE 1 TABLET(10 MG) BY MOUTH DAILY     Cardiovascular:  Calcium Channel Blockers Failed - 04/15/2020  6:20 AM      Failed - Valid encounter within last 6 months    Recent Outpatient Visits          9 months ago Hordeolum externum of left upper eyelid   [facility] [place], Dionne Bucy, MD   10 months ago Annual physical exam   [place], Clearnce Sorrel, [place]   1 year ago Trigger middle finger of right hand   [place], Clearnce Sorrel, [place]   1 year ago Annual physical exam   [facility] Fenton Malling M, [place]   2 years ago Essential (primary) hypertension   [facility] Fenton Malling M, [place]             Passed - Last BP in normal range    BP Readings from Last 1 Encounters:  07/13/19 120/86         . montelukast (SINGULAIR) 10 MG tablet [Pharmacy Med Name: MONTELUKAST 10MG  TABLETS] 90 tablet 2    Sig:  TAKE 1 TABLET(10 MG) BY MOUTH AT BEDTIME     Pulmonology:  Leukotriene Inhibitors Passed - 04/15/2020  6:20 AM      Passed - Valid encounter within last 12 months    Recent Outpatient Visits          9 months ago Hordeolum externum of left upper eyelid   [facility] [place], Dionne Bucy, MD   10 months ago Annual physical exam   [facility] [place], Clearnce Sorrel, [place]   1 year ago Trigger middle finger of right hand   [facility] [place], Clearnce Sorrel, [place]   1 year ago Annual physical exam   [facility] [place], Clearnce Sorrel, [place]   2 years ago Essential (primary) hypertension   [facility] [place], [place], [place]

## 2020-05-01 ENCOUNTER — Ambulatory Visit: Payer: BC Managed Care – PPO | Admitting: Dermatology

## 2020-05-01 ENCOUNTER — Other Ambulatory Visit: Payer: Self-pay

## 2020-05-01 ENCOUNTER — Encounter: Payer: Self-pay | Admitting: Dermatology

## 2020-05-01 DIAGNOSIS — D1800 Hemangioma unspecified site: Secondary | ICD-10-CM

## 2020-05-01 DIAGNOSIS — L820 Inflamed seborrheic keratosis: Secondary | ICD-10-CM | POA: Diagnosis not present

## 2020-05-01 DIAGNOSIS — C4441 Basal cell carcinoma of skin of scalp and neck: Secondary | ICD-10-CM | POA: Diagnosis not present

## 2020-05-01 DIAGNOSIS — D229 Melanocytic nevi, unspecified: Secondary | ICD-10-CM | POA: Diagnosis not present

## 2020-05-01 DIAGNOSIS — L814 Other melanin hyperpigmentation: Secondary | ICD-10-CM

## 2020-05-01 DIAGNOSIS — L720 Epidermal cyst: Secondary | ICD-10-CM | POA: Diagnosis not present

## 2020-05-01 DIAGNOSIS — Z1283 Encounter for screening for malignant neoplasm of skin: Secondary | ICD-10-CM | POA: Diagnosis not present

## 2020-05-01 DIAGNOSIS — L821 Other seborrheic keratosis: Secondary | ICD-10-CM

## 2020-05-01 DIAGNOSIS — D489 Neoplasm of uncertain behavior, unspecified: Secondary | ICD-10-CM

## 2020-05-01 DIAGNOSIS — L578 Other skin changes due to chronic exposure to nonionizing radiation: Secondary | ICD-10-CM

## 2020-05-01 DIAGNOSIS — C4491 Basal cell carcinoma of skin, unspecified: Secondary | ICD-10-CM

## 2020-05-01 HISTORY — DX: Basal cell carcinoma of skin, unspecified: C44.91

## 2020-05-01 NOTE — Patient Instructions (Addendum)
Recommend daily broad spectrum sunscreen SPF 30+ to sun-exposed areas, reapply every 2 hours as needed. Call for new or changing lesions.  Seborrheic Keratosis  What causes seborrheic keratoses? Seborrheic keratoses are harmless, common skin growths that first appear during adult life.  As time goes by, more growths appear.  Some people may develop a large number of them.  Seborrheic keratoses appear on both covered and uncovered body parts.  They are not caused by sunlight.  The tendency to develop seborrheic keratoses can be inherited.  They vary in color from skin-colored to gray, brown, or even black.  They can be either smooth or have a rough, warty surface.   Seborrheic keratoses are superficial and look as if they were stuck on the skin.  Under the microscope this type of keratosis looks like layers upon layers of skin.  That is why at times the top layer may seem to fall off, but the rest of the growth remains and re-grows.    Treatment Seborrheic keratoses do not need to be treated, but can easily be removed in the office.  Seborrheic keratoses often cause symptoms when they rub on clothing or jewelry.  Lesions can be in the way of shaving.  If they become inflamed, they can cause itching, soreness, or burning.  Removal of a seborrheic keratosis can be accomplished by freezing, burning, or surgery. If any spot bleeds, scabs, or grows rapidly, please return to have it checked, as these can be an indication of a skin cancer.  Prior to procedure, discussed risks of blister formation, small wound, skin dyspigmentation, or rare scar following cryotherapy.  Liquid nitrogen was applied for 10-12 seconds to the skin lesion and the expected blistering or scabbing reaction explained. Do not pick at the area. Patient reminded to expect hypopigmented scars from the procedure. Return if lesion fails to fully resolve.  Cryotherapy Aftercare  . Wash gently with soap and water everyday.   Marland Kitchen Apply Vaseline  and Band-Aid daily until healed.  Wound Care Instructions  1. Cleanse wound gently with soap and water once a day then pat dry with clean gauze. Apply a thing coat of Petrolatum (petroleum jelly, "Vaseline") over the wound (unless you have an allergy to this). We recommend that you use a new, sterile tube of Vaseline. Do not pick or remove scabs. Do not remove the yellow or white "healing tissue" from the base of the wound.  2. Cover the wound with fresh, clean, nonstick gauze and secure with paper tape. You may use Band-Aids in place of gauze and tape if the would is small enough, but would recommend trimming much of the tape off as there is often too much. Sometimes Band-Aids can irritate the skin.  3. You should call the office for your biopsy report after 1 week if you have not already been contacted.  4. If you experience any problems, such as abnormal amounts of bleeding, swelling, significant bruising, significant pain, or evidence of infection, please call the office immediately.  5. FOR ADULT SURGERY PATIENTS: If you need something for pain relief you may take 1 extra strength Tylenol (acetaminophen) AND 2 Ibuprofen (200mg  each) together every 4 hours as needed for pain. (do not take these if you are allergic to them or if you have a reason you should not take them.) Typically, you may only need pain medication for 1 to 3 days.    Recommend taking Heliocare sun protection supplement daily in sunny weather for additional sun protection.  For maximum protection on the sunniest days, you can take up to 2 capsules of regular Heliocare OR take 1 capsule of Heliocare Ultra. For prolonged exposure (such as a full day in the sun), you can repeat your dose of the supplement 4 hours after your first dose. Heliocare can be purchased at [facility] or at [email or web address].

## 2020-05-01 NOTE — Progress Notes (Signed)
Follow-Up Visit   Subjective  Amanda Day is a 54 y.o. female who presents for the following: TBSE.  Patient here for full body skin exam and skin cancer screening. She does have a few concerns on back and left inner thigh.  She notes some spots on the back that have been present for a long time and that are itchy.  They have not been previously treated.  Patient does not have h/o skin cancer, though mother has skin cancer on nose and chest, and mother's father passed away from skin cancer.  The following portions of the chart were reviewed this encounter and updated as appropriate:  Tobacco  Allergies  Meds  Problems  Med Hx  Surg Hx  Fam Hx      Review of Systems:  No other skin or systemic complaints except as noted in HPI or Assessment and Plan.  Objective  Well appearing patient in no apparent distress; mood and affect are within normal limits.  A full examination was performed including scalp, head, eyes, ears, nose, lips, neck, chest, axillae, abdomen, back, buttocks, bilateral upper extremities, bilateral lower extremities, hands, feet, fingers, toes, fingernails, and toenails. All findings within normal limits unless otherwise noted below.  Objective  Left Upper Back, Right Mid Back (3): Erythematous keratotic or waxy stuck-on papule or plaque.   Objective  Right Upper Eyelid: Smooth white papules  Objective  Left Anterior Neck: 0.4 cm pearly pink papule        Assessment & Plan  Inflamed seborrheic keratosis (4) Left Upper Back; Right Mid Back (3)  Cryotherapy today Prior to procedure, discussed risks of blister formation, small wound, skin dyspigmentation, or rare scar following cryotherapy.    Destruction of lesion - Left Upper Back, Right Mid Back  Destruction method: cryotherapy   Informed consent: discussed and consent obtained   Lesion destroyed using liquid nitrogen: Yes   Outcome: patient tolerated procedure well with no  complications   Post-procedure details: wound care instructions given    [place] Right Upper Eyelid  Benign-appearing.  Observation.  Call clinic for new or changing lesions.  Recommend daily use of broad spectrum spf 30+ sunscreen to sun-exposed areas.  Neoplasm of uncertain behavior Left Anterior Neck  Skin / nail biopsy Type of biopsy: tangential   Informed consent: discussed and consent obtained   Timeout: patient name, date of birth, surgical site, and procedure verified   Procedure prep:  Patient was prepped and draped in usual sterile fashion Prep type:  Isopropyl alcohol Anesthesia: the lesion was anesthetized in a standard fashion   Anesthetic:  1% lidocaine w/ epinephrine 1-100,000 buffered w/ 8.4% NaHCO3 Instrument used: flexible razor blade   Hemostasis achieved with: aluminum chloride   Outcome: patient tolerated procedure well   Post-procedure details: sterile dressing applied and wound care instructions given   Dressing type: bandage (mupirocin)    Specimen 1 - Surgical pathology Differential Diagnosis: R/O BCC Check Margins: No 0.4 cm pearly pink papule  Lentigines - Scattered tan macules - Discussed due to sun exposure - Benign, observe - Call for any changes  Seborrheic Keratoses - Stuck-on, waxy, tan-brown papules and plaques  - Discussed benign etiology and prognosis. - Observe - Call for any changes  Melanocytic Nevi - Tan-brown and/or pink-flesh-colored symmetric macules and papules - Benign appearing on exam today - Observation - Call clinic for new or changing moles - Recommend daily use of broad spectrum spf 30+ sunscreen to sun-exposed areas.   Hemangiomas -  Red papules - Discussed benign nature - Observe - Call for any changes  Actinic Damage - diffuse scaly erythematous macules with underlying dyspigmentation - Recommend daily broad spectrum sunscreen SPF 30+ to sun-exposed areas, reapply every 2 hours as needed.  - Call for new or  changing lesions.  Skin cancer screening performed today.   Return in about 6 months (around 10/29/2020) for TBSE.  I, Donzetta Kohut, CMA, am acting as scribe for Forest Gleason, MD .  Documentation: I have reviewed the above documentation for accuracy and completeness, and I agree with the above.  Forest Gleason, MD

## 2020-05-06 ENCOUNTER — Telehealth: Payer: Self-pay

## 2020-05-06 NOTE — Telephone Encounter (Signed)
Patient called and informed of biopsy results, patient verbalized understanding. Patient was scheduled for surgery 07/14/2020 at 11 AM

## 2020-05-06 NOTE — Progress Notes (Signed)
Skin , left anterior neck BASAL CELL CARCINOMA, NODULAR PATTERN --> excision  Dr. Jerilynn Mages called and no answer. Unable to leave voicemail as voicemail not set up.  MAs please call. Once Debbi is notified, please release result to MyChart. Thank you.

## 2020-05-19 ENCOUNTER — Encounter: Payer: Self-pay | Admitting: Dermatology

## 2020-06-27 NOTE — Progress Notes (Signed)
Complete physical exam   Patient: Amanda Day   DOB: 04-04-1966   54 y.o. Female  MRN: [id number] Visit Date: 06/30/2020  Today's healthcare provider: Mar Daring, PA-C   Chief Complaint  Patient presents with  . Annual Exam   Subjective    Amanda Day is a 54 y.o. female who presents today for a complete physical exam.  She reports consuming a general diet. Home exercise routine includes walks at least an hour on the weekend and does a work out routine evry morning. She generally feels well. She reports sleeping well. She does not have additional problems to discuss today.   Reports that she had a sleep study with ENT and she has severe apnea. She uses a cpap to go to sleep. Sleeping better. Not having as much daytime somnolence in the midday.   She also reports that she is being followed by Dermatology, Dr. Laurence Ferrari. She had some spots removed from her back. 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  HPI  06/21/18-Both Pap smear and endometrial biopsy results were negative-followed by GYN 10/29/19-Mammogram-Normal 02/21/15-Repeat colonoscopy in 10 years for screening   Past Medical History:  Diagnosis Date  . Anxiety   . Arthritis    right great toe  . GERD (gastroesophageal reflux disease)    OCC  . Hypertension   . Motion sickness    long car rides, amusement park rides  . Seasonal allergies    Past Surgical History:  Procedure Laterality Date  . COLONOSCOPY N/A 02/21/2015   Procedure: COLONOSCOPY;  Surgeon: Lucilla Lame, MD;  Location: [facility];  Service: Gastroenterology;  Laterality: N/A;  . KNEE ARTHROSCOPY Left 10/04/2016   Procedure: ARTHROSCOPY KNEE;  Surgeon: Dereck Leep, MD;  Location: [facility];  Service: Orthopedics;  Laterality: Left;  . KNEE ARTHROSCOPY Right 04/06/2017   Procedure: ARTHROSCOPY KNEE, PARTIAL MEDIAL MENISCECTOMY, MEDIAL CHONDROPLASTY;   Surgeon: Dereck Leep, MD;  Location: [facility];  Service: Orthopedics;  Laterality: Right;  . 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 TUBAL LIGATION  2009   Social History   Socioeconomic History  . Marital status: Divorced    Spouse name: Not on file  . Number of children: 2  . Years of education: 39  . Highest education level: Not on file  Occupational History  . 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 Smoking status: Never Smoker  . Smokeless tobacco: Never Used  Vaping Use  . Vaping Use: Never used  Substance and Sexual Activity  . Alcohol use: Yes    Comment: 4 nights a week, wine or beer  . Drug use: No  . Sexual activity: Not Currently    Birth control/protection: Surgical  Other Topics Concern  . Not on file  Social History Narrative  . Not on file   Social Determinants of Health   Financial Resource Strain:   . Difficulty of Paying Living Expenses: Not on file  Food Insecurity:   . Worried About Charity fundraiser in the Last Year: Not on file  . Ran Out of Food in the Last Year: Not on file  Transportation Needs:   . Lack of Transportation (Medical): Not on file  . Lack of Transportation (Non-Medical): Not on file  Physical Activity:   . Days of  Exercise per Week: Not on file  . Minutes of Exercise per Session: Not on file  Stress:   . Feeling of Stress : Not on file  Social Connections:   . Frequency of Communication with Friends and Family: Not on file  . Frequency of Social Gatherings with Friends and Family: Not on file  . Attends Religious Services: Not on file  . Active Member of Clubs or Organizations: Not on file  . Attends Archivist Meetings: Not on file  . Marital Status: Not on file  Intimate Partner Violence:   . Fear of Current or Ex-Partner: Not on file  . Emotionally Abused: Not on file  . Physically Abused: Not on file  . Sexually Abused: Not on file     Family Status  Relation Name Status  . Mother  Alive  . Father  Deceased at age 19  . Sister  Alive  . Brother  Alive   Family History  Problem Relation Age of Onset  . Hypertension Mother   . Anxiety disorder Mother   . Mitral valve prolapse Mother   . Cancer Father        Lung and Kidney  . Hypertension Father   . Arrhythmia Father   . Hypertension Sister   . Hypertension Brother    No Known Allergies  Patient Care Team: Rubye Beach as PCP - General (Family Medicine)   Medications: Outpatient Medications Prior to Visit  Medication Sig  . amLODipine (NORVASC) 10 MG tablet TAKE 1 TABLET(10 MG) BY MOUTH DAILY  . Cholecalciferol (VITAMIN D) 2000 units tablet Take 1 tablet by mouth daily.  . fluticasone (FLONASE) 50 MCG/ACT nasal spray SHAKE LIQUID AND USE 2 SPRAYS IN EACH NOSTRIL DAILY AS NEEDED  . Glucosamine-Chondroitin-MSM 500-200-150 MG TABS Take 1 tablet by mouth daily.  Marland Kitchen ibuprofen (ADVIL,MOTRIN) 200 MG tablet Take 400 mg by mouth 3 (three) times daily as needed for headache or mild pain.   Marland Kitchen loratadine (CLARITIN) 10 MG tablet Take 10 mg by mouth daily.   . metoprolol succinate (TOPROL-XL) 50 MG 24 hr tablet TAKE 1 TABLET BY MOUTH EVERY DAY  . montelukast (SINGULAIR) 10 MG tablet TAKE 1 TABLET(10 MG) BY MOUTH AT BEDTIME  . omeprazole (PRILOSEC) 10 MG capsule Take 1 capsule by mouth as needed.  . pyridoxine (B-6) 100 MG tablet Take 100 mg by mouth daily.  . sertraline (ZOLOFT) 50 MG tablet TAKE 1/2 TO 1 TABLET BY MOUTH EVERY DAY  . vitamin B-12 (CYANOCOBALAMIN) 1000 MCG tablet Take 1,000 mcg by mouth daily.  Marland Kitchen azelastine (ASTELIN) 0.1 % nasal spray SMARTSIG:2 Spray(s) Both Nares Every 12 Hours PRN (Patient not taking: Reported on 06/30/2020)   No facility-administered medications prior to visit.    Review of Systems  Constitutional: Negative.   HENT: Positive for congestion and hearing loss.   Eyes: Negative.   Respiratory: Positive for apnea.    Cardiovascular: Negative.   Gastrointestinal: Negative.   Endocrine: Negative.   Genitourinary: Negative.   Musculoskeletal: Positive for arthralgias.  Skin: Negative.   Allergic/Immunologic: Negative.   Neurological: Negative.   Hematological: Negative.   Psychiatric/Behavioral: Negative.     Last CBC Lab Results  Component Value Date   WBC 5.8 06/19/2019   HGB 12.8 06/19/2019   HCT 38.4 06/19/2019   MCV 91 06/19/2019   MCH 30.3 06/19/2019   RDW 12.7 06/19/2019   PLT 287 89/[birth year]/1941   Last metabolic panel Lab Results  Component Value Date  GLUCOSE 93 06/19/2019   NA 141 06/19/2019   K 4.0 06/19/2019   CL 102 06/19/2019   CO2 24 06/19/2019   BUN 19 06/19/2019   CREATININE 0.81 06/19/2019   GFRNONAA 83 06/19/2019   GFRAA 95 06/19/2019   CALCIUM 9.4 06/19/2019   PROT 6.5 06/19/2019   ALBUMIN 4.3 06/19/2019   LABGLOB 2.2 06/19/2019   AGRATIO 2.0 06/19/2019   BILITOT 0.2 06/19/2019   ALKPHOS 79 06/19/2019   AST 24 06/19/2019   ALT 25 06/19/2019      Objective    BP 137/80 (BP Location: Left Arm, Patient Position: Sitting, Cuff Size: Large)   Pulse 78   Temp 99.1 F (37.3 C) (Oral)   Resp 16   Wt 212 lb (96.2 kg)   LMP 05/23/2018   BMI 32.23 kg/m  BP Readings from Last 3 Encounters:  06/30/20 137/80  07/13/19 120/86  06/19/19 111/79   Wt Readings from Last 3 Encounters:  06/30/20 212 lb (96.2 kg)  07/13/19 199 lb 12.8 oz (90.6 kg)  06/19/19 197 lb (89.4 kg)      Physical Exam Vitals reviewed.  Constitutional:      General: She is not in acute distress.    Appearance: Normal appearance. She is well-developed. She is obese. She is not ill-appearing or diaphoretic.  HENT:     Head: Normocephalic and atraumatic.     Right Ear: Tympanic membrane, ear canal and external ear normal.     Left Ear: Tympanic membrane, ear canal and external ear normal.     Nose: Nose normal.     Mouth/Throat:     Mouth: Mucous membranes are moist.     Pharynx:  Oropharynx is clear. No oropharyngeal exudate or posterior oropharyngeal erythema.  Eyes:     General: No scleral icterus.       Right eye: No discharge.        Left eye: No discharge.     Extraocular Movements: Extraocular movements intact.     Conjunctiva/sclera: Conjunctivae normal.     Pupils: Pupils are equal, round, and reactive to light.  Neck:     Thyroid: No thyromegaly.     Vascular: No carotid bruit or JVD.     Trachea: No tracheal deviation.  Cardiovascular:     Rate and Rhythm: Normal rate and regular rhythm.     Pulses: Normal pulses.     Heart sounds: Normal heart sounds. No murmur heard.  No friction rub. No gallop.   Pulmonary:     Effort: Pulmonary effort is normal. No respiratory distress.     Breath sounds: Normal breath sounds. No wheezing or rales.  Chest:     Chest wall: No tenderness.  Abdominal:     General: Bowel sounds are normal. There is no distension.     Palpations: Abdomen is soft. There is no mass.     Tenderness: There is no abdominal tenderness. There is no guarding or rebound.  Musculoskeletal:        General: No tenderness. Normal range of motion.     Cervical back: Normal range of motion and neck supple. No tenderness.     Right lower leg: No edema.     Left lower leg: No edema.  Lymphadenopathy:     Cervical: No cervical adenopathy.  Skin:    General: Skin is warm and dry.     Capillary Refill: Capillary refill takes less than 2 seconds.     Findings: No rash.  Neurological:  General: No focal deficit present.     Mental Status: She is alert and oriented to person, place, and time. Mental status is at baseline.     Motor: No weakness.     Gait: Gait normal.  Psychiatric:        Mood and Affect: Mood normal.        Behavior: Behavior normal.        Thought Content: Thought content normal.        Judgment: Judgment normal.      Last depression screening scores PHQ 2/9 Scores 06/30/2020 06/19/2019 06/07/2018  PHQ - 2 Score 0 1  0  PHQ- 9 Score 1 2 2    Last fall risk screening Fall Risk  06/19/2019  Falls in the past year? 0  Number falls in past yr: 0  Injury with Fall? 0  Follow up Falls evaluation completed   Last Audit-C alcohol use screening Alcohol Use Disorder Test (AUDIT) 06/30/2020  1. How often do you have a drink containing alcohol? 4  2. How many drinks containing alcohol do you have on a typical day when you are drinking? 0  3. How often do you have six or more drinks on one occasion? 0  AUDIT-C Score 4  Alcohol Brief Interventions/Follow-up AUDIT Score <7 follow-up not indicated   A score of 3 or more in women, and 4 or more in men indicates increased risk for alcohol abuse, EXCEPT if all of the points are from question 1   No results found for any visits on 06/30/20.  Assessment & Plan    Routine Health Maintenance and Physical Exam  Exercise Activities and Dietary recommendations Goals   None     Immunization History  Administered Date(s) Administered  . Influenza,inj,Quad PF,6+ Mos 05/10/2013, 05/24/2015, 07/28/2017, 06/07/2018, 06/19/2019  . Tdap 03/15/2011  . Zoster Recombinat (Shingrix) 06/19/2019, 10/15/2019    Health Maintenance  Topic Date Due  . Hepatitis C Screening  Never done  . COVID-19 Vaccine (1) Never done  . INFLUENZA VACCINE  03/23/2020  . MAMMOGRAM  10/28/2020  . TETANUS/TDAP  03/14/2021  . PAP SMEAR-Modifier  06/21/2021  . COLONOSCOPY  02/20/2025  . HIV Screening  Completed    Discussed health benefits of physical activity, and encouraged her to engage in regular exercise appropriate for her age and condition.  1. Annual physical exam Normal physical exam today. Will check labs as below and f/u pending lab results. If labs are stable and WNL she will not need to have these rechecked for one year at her next annual physical exam. She is to call the office in the meantime if she has any acute issue, questions or concerns. - CBC with  Differential/Platelet - Comprehensive metabolic panel - Hemoglobin A1c - TSH - Lipid panel  2. Encounter for breast cancer screening using non-mammogram modality Breast exam today was normal. There is no family history of breast cancer. She does perform regular self breast exams. Mammogram was ordered as below. Information for [facility] clinic was given to patient so she may schedule her mammogram at her convenience. - MM 3D SCREEN BREAST BILATERAL; Future  3. Encounter for hepatitis C screening test for low risk patient Will check labs as below and f/u pending results. - Hepatitis C antibody  4. Class 1 obesity due to excess calories with serious comorbidity and body mass index (BMI) of 32.0 to 32.9 in adult Counseled patient on healthy lifestyle modifications including dieting and exercise.  Will check labs  as below and f/u pending results. - Comprehensive metabolic panel - Hemoglobin A1c - TSH - Lipid panel  5. Avitaminosis D H/O this and postmenopausal. Will check labs as below and f/u pending results. - Vitamin D (25 hydroxy)  6. Essential (primary) hypertension Stable. Diagnosis pulled for medication refill. Continue current medical treatment plan. - metoprolol succinate (TOPROL-XL) 50 MG 24 hr tablet; Take 1 tablet (50 mg total) by mouth daily. Take with or immediately following a meal.  Dispense: 90 tablet; Refill: 3 - amLODipine (NORVASC) 10 MG tablet; TAKE 1 TABLET(10 MG) BY MOUTH DAILY  Dispense: 90 tablet; Refill: 3  7. OSA on CPAP New diagnosis. Been on CPAP about one month. Has noticed improvement in not as many nighttime awakenings and does not have daytime somnolence as much.  8. Depression, major, single episode, complete remission ([place]) Stable. Diagnosis pulled for medication refill. Continue current medical treatment plan. - sertraline (ZOLOFT) 50 MG tablet; TAKE 1/2 TO 1 TABLET BY MOUTH EVERY DAY  Dispense: 90 tablet; Refill: 3  9. Non-seasonal allergic  rhinitis due to pollen Stable. Diagnosis pulled for medication refill. Continue current medical treatment plan. - montelukast (SINGULAIR) 10 MG tablet; TAKE 1 TABLET(10 MG) BY MOUTH AT BEDTIME  Dispense: 90 tablet; Refill: 3 - fluticasone (FLONASE) 50 MCG/ACT nasal spray; Place 1-2 sprays into both nostrils daily.  Dispense: 48 g; Refill: 3   No follow-ups on file.     Reynolds Bowl, PA-C, have reviewed all documentation for this visit. The documentation on 06/30/20 for the exam, diagnosis, procedures, and orders are all accurate and complete.   Rubye Beach  [facility] [phone] (phone) [phone] (fax)  [place]

## 2020-06-30 ENCOUNTER — Ambulatory Visit (INDEPENDENT_AMBULATORY_CARE_PROVIDER_SITE_OTHER): Payer: BC Managed Care – PPO | Admitting: Physician Assistant

## 2020-06-30 ENCOUNTER — Encounter: Payer: Self-pay | Admitting: Physician Assistant

## 2020-06-30 ENCOUNTER — Other Ambulatory Visit: Payer: Self-pay

## 2020-06-30 VITALS — BP 137/80 | HR 78 | Temp 99.1°F | Resp 16 | Wt 212.0 lb

## 2020-06-30 DIAGNOSIS — I10 Essential (primary) hypertension: Secondary | ICD-10-CM

## 2020-06-30 DIAGNOSIS — Z0000 Encounter for general adult medical examination without abnormal findings: Secondary | ICD-10-CM

## 2020-06-30 DIAGNOSIS — E6609 Other obesity due to excess calories: Secondary | ICD-10-CM | POA: Diagnosis not present

## 2020-06-30 DIAGNOSIS — Z1239 Encounter for other screening for malignant neoplasm of breast: Secondary | ICD-10-CM

## 2020-06-30 DIAGNOSIS — J301 Allergic rhinitis due to pollen: Secondary | ICD-10-CM

## 2020-06-30 DIAGNOSIS — Z9989 Dependence on other enabling machines and devices: Secondary | ICD-10-CM

## 2020-06-30 DIAGNOSIS — Z1159 Encounter for screening for other viral diseases: Secondary | ICD-10-CM

## 2020-06-30 DIAGNOSIS — Z6832 Body mass index (BMI) 32.0-32.9, adult: Secondary | ICD-10-CM

## 2020-06-30 DIAGNOSIS — E559 Vitamin D deficiency, unspecified: Secondary | ICD-10-CM

## 2020-06-30 DIAGNOSIS — F325 Major depressive disorder, single episode, in full remission: Secondary | ICD-10-CM

## 2020-06-30 DIAGNOSIS — G4733 Obstructive sleep apnea (adult) (pediatric): Secondary | ICD-10-CM

## 2020-06-30 MED ORDER — FLUTICASONE PROPIONATE 50 MCG/ACT NA SUSP: 1.0000 | Freq: Every day | NASAL | 3 refills | Status: DC

## 2020-06-30 MED ORDER — SERTRALINE HCL 50 MG PO TABS: ORAL_TABLET | ORAL | 3 refills | Status: DC

## 2020-06-30 MED ORDER — AMLODIPINE BESYLATE 10 MG PO TABS: ORAL_TABLET | ORAL | 3 refills | Status: DC

## 2020-06-30 MED ORDER — MONTELUKAST SODIUM 10 MG PO TABS: ORAL_TABLET | ORAL | 3 refills | Status: DC

## 2020-06-30 MED ORDER — METOPROLOL SUCCINATE ER 50 MG PO TB24: 50.0000 mg | ORAL_TABLET | Freq: Every day | ORAL | 3 refills | Status: DC

## 2020-06-30 NOTE — Patient Instructions (Signed)
[facility] [address] [place],  [place]  [address] Main: [phone]   Preventive Care 40-55 Years Old, Female Preventive care refers to visits with your health care provider and lifestyle choices that can promote health and wellness. This includes:  A yearly physical exam. This may also be called an annual well check.  Regular dental visits and eye exams.  Immunizations.  Screening for certain conditions.  Healthy lifestyle choices, such as eating a healthy diet, getting regular exercise, not using drugs or products that contain nicotine and tobacco, and limiting alcohol use. What can I expect for my preventive care visit? Physical exam Your health care provider will check your:  Height and weight. This may be used to calculate body mass index (BMI), which tells if you are at a healthy weight.  Heart rate and blood pressure.  Skin for abnormal spots. Counseling Your health care provider may ask you questions about your:  Alcohol, tobacco, and drug use.  Emotional well-being.  Home and relationship well-being.  Sexual activity.  Eating habits.  Work and work environment.  Method of birth control.  Menstrual cycle.  Pregnancy history. What immunizations do I need?  Influenza (flu) vaccine  This is recommended every year. Tetanus, diphtheria, and pertussis (Tdap) vaccine  You may need a Td booster every 10 years. Varicella (chickenpox) vaccine  You may need this if you have not been vaccinated. Zoster (shingles) vaccine  You may need this after age 60. Measles, mumps, and rubella (MMR) vaccine  You may need at least one dose of MMR if you were born in 1957 or later. You may also need a second dose. Pneumococcal conjugate (PCV13) vaccine  You may need this if you have certain conditions and were not previously vaccinated. Pneumococcal polysaccharide (PPSV23) vaccine  You may need one or two doses if you smoke  cigarettes or if you have certain conditions. Meningococcal conjugate (MenACWY) vaccine  You may need this if you have certain conditions. Hepatitis A vaccine  You may need this if you have certain conditions or if you travel or work in places where you may be exposed to hepatitis A. Hepatitis B vaccine  You may need this if you have certain conditions or if you travel or work in places where you may be exposed to hepatitis B. Haemophilus influenzae type b (Hib) vaccine  You may need this if you have certain conditions. Human papillomavirus (HPV) vaccine  If recommended by your health care provider, you may need three doses over 6 months. You may receive vaccines as individual doses or as more than one vaccine together in one shot (combination vaccines). Talk with your health care provider about the risks and benefits of combination vaccines. What tests do I need? Blood tests  Lipid and cholesterol levels. These may be checked every 5 years, or more frequently if you are over 50 years old.  Hepatitis C test.  Hepatitis B test. Screening  Lung cancer screening. You may have this screening every year starting at age 55 if you have a 30-pack-year history of smoking and currently smoke or have quit within the past 15 years.  Colorectal cancer screening. All adults should have this screening starting at age 50 and continuing until age 75. Your health care provider may recommend screening at age 45 if you are at increased risk. You will have tests every 1-10 years, depending on your results and the type of screening test.  Diabetes screening. This is   done by checking your blood sugar (glucose) after you have not eaten for a while (fasting). You may have this done every 1-3 years.  Mammogram. This may be done every 1-2 years. Talk with your health care provider about when you should start having regular mammograms. This may depend on whether you have a family history of breast  cancer.  BRCA-related cancer screening. This may be done if you have a family history of breast, ovarian, tubal, or peritoneal cancers.  Pelvic exam and Pap test. This may be done every 3 years starting at age 52. Starting at age 97, this may be done every 5 years if you have a Pap test in combination with an HPV test. Other tests  Sexually transmitted disease (STD) testing.  Bone density scan. This is done to screen for osteoporosis. You may have this scan if you are at high risk for osteoporosis. Follow these instructions at home: Eating and drinking  Eat a diet that includes fresh fruits and vegetables, whole grains, lean protein, and low-fat dairy.  Take vitamin and mineral supplements as recommended by your health care provider.  Do not drink alcohol if: ? Your health care provider tells you not to drink. ? You are pregnant, may be pregnant, or are planning to become pregnant.  If you drink alcohol: ? Limit how much you have to 0-1 drink a day. ? Be aware of how much alcohol is in your drink. In the [place] one drink equals one 12 oz bottle of beer (355 mL), one 5 oz glass of wine (148 mL), or one 1 oz glass of hard liquor (44 mL). Lifestyle  Take daily care of your teeth and gums.  Stay active. Exercise for at least 30 minutes on 5 or more days each week.  Do not use any products that contain nicotine or tobacco, such as cigarettes, e-cigarettes, and chewing tobacco. If you need help quitting, ask your health care provider.  If you are sexually active, practice safe sex. Use a condom or other form of birth control (contraception) in order to prevent pregnancy and STIs (sexually transmitted infections).  If told by your health care provider, take low-dose aspirin daily starting at age 42. What's next?  Visit your health care provider once a year for a well check visit.  Ask your health care provider how often you should have your eyes and teeth checked.  Stay up to date  on all vaccines. This information is not intended to replace advice given to you by your health care provider. Make sure you discuss any questions you have with your health care provider. Document Revised: 04/20/2018 Document Reviewed: 04/20/2018 Elsevier Patient Education  2020 [employer].

## 2020-07-01 ENCOUNTER — Ambulatory Visit: Payer: Self-pay

## 2020-07-01 LAB — CBC WITH DIFFERENTIAL/PLATELET
Basophils Absolute: 0 10*3/uL (ref 0.0–0.2)
Basos: 0 %
EOS (ABSOLUTE): 0.1 10*3/uL (ref 0.0–0.4)
Eos: 2 %
Hematocrit: 38.1 % (ref 34.0–46.6)
Hemoglobin: 12.9 g/dL (ref 11.1–15.9)
Immature Grans (Abs): 0 10*3/uL (ref 0.0–0.1)
Immature Granulocytes: 0 %
Lymphocytes Absolute: 1.3 10*3/uL (ref 0.7–3.1)
Lymphs: 27 %
MCH: 30.9 pg (ref 26.6–33.0)
MCHC: 33.9 g/dL (ref 31.5–35.7)
MCV: 91 fL (ref 79–97)
Monocytes Absolute: 0.4 10*3/uL (ref 0.1–0.9)
Monocytes: 9 %
Neutrophils Absolute: 2.9 10*3/uL (ref 1.4–7.0)
Neutrophils: 62 %
Platelets: 273 10*3/uL (ref 150–450)
RBC: 4.18 x10E6/uL (ref 3.77–5.28)
RDW: 12.2 % (ref 11.7–15.4)
WBC: 4.8 10*3/uL (ref 3.4–10.8)

## 2020-07-01 LAB — COMPREHENSIVE METABOLIC PANEL
ALT: 29 IU/L (ref 0–32)
AST: 23 IU/L (ref 0–40)
Albumin/Globulin Ratio: 1.8 (ref 1.2–2.2)
Albumin: 4.4 g/dL (ref 3.8–4.9)
Alkaline Phosphatase: 65 IU/L (ref 44–121)
BUN/Creatinine Ratio: 23 (ref 9–23)
BUN: 19 mg/dL (ref 6–24)
Bilirubin Total: 0.3 mg/dL (ref 0.0–1.2)
CO2: 26 mmol/L (ref 20–29)
Calcium: 10 mg/dL (ref 8.7–10.2)
Chloride: 105 mmol/L (ref 96–106)
Creatinine, Ser: 0.82 mg/dL (ref 0.57–1.00)
GFR calc Af Amer: 93 mL/min/{1.73_m2} (ref >59)
GFR calc non Af Amer: 81 mL/min/{1.73_m2} (ref >59)
Globulin, Total: 2.4 g/dL (ref 1.5–4.5)
Glucose: 94 mg/dL (ref 65–99)
Potassium: 4.5 mmol/L (ref 3.5–5.2)
Sodium: 142 mmol/L (ref 134–144)
Total Protein: 6.8 g/dL (ref 6.0–8.5)

## 2020-07-01 LAB — HEPATITIS C ANTIBODY: Hep C Virus Ab: <0.1 s/co ratio (ref 0.0–0.9)

## 2020-07-01 LAB — TSH: TSH: 1.66 u[IU]/mL (ref 0.450–4.500)

## 2020-07-01 LAB — LIPID PANEL
Chol/HDL Ratio: 4.6 ratio — ABNORMAL HIGH (ref 0.0–4.4)
Cholesterol, Total: 240 mg/dL — ABNORMAL HIGH (ref 100–199)
HDL: 52 mg/dL (ref >39)
LDL Chol Calc (NIH): 158 mg/dL — ABNORMAL HIGH (ref 0–99)
Triglycerides: 168 mg/dL — ABNORMAL HIGH (ref 0–149)
VLDL Cholesterol Cal: 30 mg/dL (ref 5–40)

## 2020-07-01 LAB — HEMOGLOBIN A1C
Est. average glucose Bld gHb Est-mCnc: 108 mg/dL
Hgb A1c MFr Bld: 5.4 % (ref 4.8–5.6)

## 2020-07-01 LAB — VITAMIN D 25 HYDROXY (VIT D DEFICIENCY, FRACTURES): Vit D, 25-Hydroxy: 46.2 ng/mL (ref 30.0–100.0)

## 2020-07-01 NOTE — Telephone Encounter (Signed)
Patient called and says she has a lump underneath the jaw to the right side of her face. She says she's seen the ENT in the past for it and has an appointment on 07/16/20, but wanted to see if she should be seen prior to that date. She says she noticed it this morning, about 1 inch in diameter, tender to the touch, painful when she turns her head, no redness noted. No other symptoms. Appointment scheduled for tomorrow at 1520 with Fenton Malling, PA-C, care advice given, patient verbalized understanding.  Reason for Disposition . [1] Swelling is painful to touch AND [2] no fever  Answer Assessment - Initial Assessment Questions 1. APPEARANCE of SWELLING: "What does it look like?" (e.g., lymph node, insect bite, mole)     Lump 2. SIZE: "How large is the swelling?" (inches, cm or compare to coins)     1 inch in diameter 3. LOCATION: "Where is the swelling located?"     Under the right jaw line 4. ONSET: "When did the swelling start?"     Noticed this morning 5. PAIN: "Is it painful?" If Yes, ask: "How much?"     Tender to touch, painful when turning head 6. ITCH: "Does it itch?" If Yes, ask: "How much?"     No 7. CAUSE: "What do you think caused the swelling?"     I don't know 8. OTHER SYMPTOMS: "Do you have any other symptoms?" (e.g., fever)     No  Protocols used: SKIN LUMP OR LOCALIZED SWELLING-A-AH

## 2020-07-02 ENCOUNTER — Telehealth: Payer: Self-pay

## 2020-07-02 ENCOUNTER — Ambulatory Visit: Payer: BC Managed Care – PPO | Admitting: Physician Assistant

## 2020-07-02 ENCOUNTER — Encounter: Payer: Self-pay | Admitting: Physician Assistant

## 2020-07-02 ENCOUNTER — Other Ambulatory Visit: Payer: Self-pay

## 2020-07-02 VITALS — BP 148/92 | HR 80 | Temp 97.2°F | Resp 16

## 2020-07-02 DIAGNOSIS — K111 Hypertrophy of salivary gland: Secondary | ICD-10-CM | POA: Diagnosis not present

## 2020-07-02 DIAGNOSIS — K118 Other diseases of salivary glands: Secondary | ICD-10-CM | POA: Diagnosis not present

## 2020-07-02 NOTE — Telephone Encounter (Signed)
Copied from [place] [id number]. Topic: General - Call Back - No Documentation >> Jul 01, 2020  4:05 PM Erick Blinks wrote: Reason for CRM: Pt woke up with a large lump under her right jaw bone. Transferred to triage   Best contact: [phone]

## 2020-07-02 NOTE — Patient Instructions (Signed)
Salivary Gland Infection  A salivary gland infection is an infection in one or more of the glands that produce saliva. You have six major salivary glands. Each gland has a duct that carries saliva into your mouth. Saliva keeps your mouth moist and breaks down the food that you eat. It also helps prevent tooth decay. Two salivary glands are located just in front of your ears (parotid). The ducts for these glands open up inside your cheeks, near your back teeth. You also have two glands under your tongue (sublingual) and two glands under your jaw (submandibular). The ducts for these glands open under your tongue. Any salivary gland can become infected. Most infections occur in the parotid glands or submandibular glands. What are the causes? This condition may be caused by bacteria or viruses.  The bacteria that cause salivary gland infections are usually the same bacteria that normally live in your mouth. ? A stone can form in a salivary gland and block the flow of saliva. As a result, saliva backs up into the salivary gland. Bacteria may then start to grow behind the blockage and cause infection. ? Bacterial infections usually cause pain and swelling on one side of the face. Submandibular gland swelling occurs under the jaw. Parotid swelling occurs in front of the ear. ? Bacterial infections are more common in adults.  The mumps virus is the most common cause of viral salivary gland infections. However, mumps is now rare because of vaccination. ? This infection causes swelling in both parotid glands. ? Viral infections are more common in children. What increases the risk? The following factors may make you more likely to develop a bacterial infection:  Not taking good care of your mouth and teeth (poor oral hygiene).  Smoking.  Not drinking enough water.  Having a disease that causes dry mouth and dry eyes (Mikulicz syndrome or Sjogren syndrome). A viral infection is more likely to occur in  children who do not get the MMR (measles, mumps, rubella) vaccine. What are the signs or symptoms? The main sign of a salivary gland infection is a swollen salivary gland. This type of inflammation is often called sialadenitis. You may have swelling in front of your ear, under your jaw, or under your tongue. Swelling may get worse when you eat and decrease after you eat. Other signs and symptoms include:  Pain.  Tenderness.  Redness.  Dry mouth.  Bad taste in your mouth.  Difficulty chewing and swallowing.  Fever. How is this diagnosed? This condition may be diagnosed based on:  Your signs and symptoms.  A physical exam. During the exam, your health care provider will look and feel inside your mouth to see whether a stone is blocking a salivary gland duct.  Tests, such as: ? An X-ray to check for a stone. ? An ultrasound, CT scan, or MRI to look for an abscess and to rule out other causes of swelling. ? A culture and sensitivity test. In this test, a sample of pus is taken from the salivary gland with a swab or by using a needle (aspiration). The sample is tested in a lab to determine the type of bacteria that is growing and which antibiotic medicines will work against it. You may need to see an ear, nose, and throat specialist (ENT or otolaryngologist) for diagnosis and treatment. How is this treated? Viral salivary gland infections usually clear up without treatment. Bacterial infections are usually treated with antibiotic medicine. Severe infections that cause difficulty with swallowing   may be treated with an IV antibiotic in the hospital. Other treatments may include:  Probing and widening the salivary duct to allow a stone to pass. In some cases, a thin, flexible scope (endoscope) may be inserted into the duct to find a stone and remove it.  Breaking up a stone using sound waves.  Draining an infected gland (abscess) with a needle.  Surgery to: ? Remove a stone. ? Drain  pus from an abscess. ? Remove a badly infected gland. Follow these instructions at home:  Medicines  Take over-the-counter and prescription medicines only as told by your health care provider.  If you were prescribed an antibiotic medicine, take it as told by your health care provider. Do not stop taking the antibiotic even if you start to feel better. To relieve discomfort  Follow these instructions every few hours: ? Suck on a lemon candy to stimulate the flow of saliva. ? Put a warm compress over the gland. ? Gently massage the gland.  Rinse your mouth with a salt-water mixture 3-4 times a day or as needed. To make a salt-water mixture, completely dissolve -1 tsp of salt in 1 cup of warm water. General instructions  Practice good oral hygiene by brushing and flossing your teeth after meals and before you go to bed.  Drink enough fluid to keep your urine pale yellow.  Do not use any products that contain nicotine or tobacco, such as cigarettes and e-cigarettes. If you need help quitting, ask your health care provider.  Keep all follow-up visits as told by your health care provider. This is important. Contact a health care provider if:  You have pain and swelling in your face, jaw, or mouth after eating.  You have persistent swelling in any of these places: ? In front of your ear. ? Under your jaw. ? Inside your mouth. Get help right away if:  You have pain and swelling in your face, jaw, or mouth, and this is getting worse.  Your pain and swelling make it hard to swallow or breathe. Summary  A salivary gland infection is an infection in one or more of the glands that produce saliva. You have six major salivary glands. Each gland has a duct that carries saliva into your mouth.  Any salivary gland can become infected. Most infections occur in the glands just in front of your ears (parotid glands) or the glands under your jaw (submandibular glands).  This condition may be  caused by bacteria or viruses.  Salivary gland infections caused by a virus usually clear up without treatment. Bacterial infections are usually treated with antibiotic medicine. This information is not intended to replace advice given to you by your health care provider. Make sure you discuss any questions you have with your health care provider. Document Revised: 09/07/2017 Document Reviewed: 09/07/2017 Elsevier Patient Education  2020 [employer]  

## 2020-07-02 NOTE — Progress Notes (Signed)
Established patient visit   Patient: Amanda Day   DOB: 1965/12/08   54 y.o. Female  MRN: [id number] Visit Date: 07/02/2020  Today's healthcare provider: Mar Daring, PA-C   Chief Complaint  Patient presents with  . Cyst   Subjective    HPI  Patient here with c/o lump/knot, R side of neck. It hurts and is tender to touch. Reports that around June 2020 she had something similar and was seen by ENT, Dr. Pryor Ochoa. She was found to have a mass in her right parotid gland. She did undergo Biopsy that was negative. She does have an appt scheduled with Dr. Pryor Ochoa as well.   Patient Active Problem List   Diagnosis Date Noted  . S/P arthroscopy of left knee 11/21/2016  . Knee sprain 03/21/2015  . Adaptation reaction 12/26/2014  . Allergic rhinitis 12/26/2014  . Essential (primary) hypertension 12/26/2014  . Acid reflux 12/26/2014  . Avitaminosis D 12/26/2014   Past Medical History:  Diagnosis Date  . Anxiety   . Arthritis    right great toe  . GERD (gastroesophageal reflux disease)    OCC  . Hypertension   . Motion sickness    long car rides, amusement park rides  . Seasonal allergies        Medications: Outpatient Medications Prior to Visit  Medication Sig  . amLODipine (NORVASC) 10 MG tablet TAKE 1 TABLET(10 MG) BY MOUTH DAILY  . azelastine (ASTELIN) 0.1 % nasal spray SMARTSIG:2 Spray(s) Both Nares Every 12 Hours PRN  . Cholecalciferol (VITAMIN D) 2000 units tablet Take 1 tablet by mouth daily.  . fluticasone (FLONASE) 50 MCG/ACT nasal spray Place 1-2 sprays into both nostrils daily.  . Glucosamine-Chondroitin-MSM 500-200-150 MG TABS Take 1 tablet by mouth daily.  Marland Kitchen ibuprofen (ADVIL,MOTRIN) 200 MG tablet Take 400 mg by mouth 3 (three) times daily as needed for headache or mild pain.   Marland Kitchen loratadine (CLARITIN) 10 MG tablet Take 10 mg by mouth daily.   . metoprolol succinate (TOPROL-XL) 50 MG 24 hr tablet Take 1 tablet (50 mg total) by mouth daily. Take  with or immediately following a meal.  . montelukast (SINGULAIR) 10 MG tablet TAKE 1 TABLET(10 MG) BY MOUTH AT BEDTIME  . omeprazole (PRILOSEC) 10 MG capsule Take 1 capsule by mouth as needed.  . pyridoxine (B-6) 100 MG tablet Take 100 mg by mouth daily.  . sertraline (ZOLOFT) 50 MG tablet TAKE 1/2 TO 1 TABLET BY MOUTH EVERY DAY  . vitamin B-12 (CYANOCOBALAMIN) 1000 MCG tablet Take 1,000 mcg by mouth daily.   No facility-administered medications prior to visit.    Review of Systems  Constitutional: Negative.   HENT: Negative for trouble swallowing.   Respiratory: Negative.   Cardiovascular: Negative.     Last CBC Lab Results  Component Value Date   WBC 4.8 06/30/2020   HGB 12.9 06/30/2020   HCT 38.1 06/30/2020   MCV 91 06/30/2020   MCH 30.9 06/30/2020   RDW 12.2 06/30/2020   PLT 273 78/29/5621   Last metabolic panel Lab Results  Component Value Date   GLUCOSE 94 06/30/2020   NA 142 06/30/2020   K 4.5 06/30/2020   CL 105 06/30/2020   CO2 26 06/30/2020   BUN 19 06/30/2020   CREATININE 0.82 06/30/2020   GFRNONAA 81 06/30/2020   GFRAA 93 06/30/2020   CALCIUM 10.0 06/30/2020   PROT 6.8 06/30/2020   ALBUMIN 4.4 06/30/2020   LABGLOB 2.4 06/30/2020   AGRATIO  1.8 06/30/2020   BILITOT 0.3 06/30/2020   ALKPHOS 65 06/30/2020   AST 23 06/30/2020   ALT 29 06/30/2020      Objective    BP (!) 148/92 (BP Location: Left Arm, Patient Position: Sitting, Cuff Size: Large)   Pulse 80   Temp (!) 97.2 F (36.2 C) (Oral)   Resp 16   LMP 05/23/2018  BP Readings from Last 3 Encounters:  07/02/20 (!) 148/92  06/30/20 137/80  07/13/19 120/86   Wt Readings from Last 3 Encounters:  06/30/20 212 lb (96.2 kg)  07/13/19 199 lb 12.8 oz (90.6 kg)  06/19/19 197 lb (89.4 kg)      Physical Exam Vitals reviewed.  Constitutional:      General: She is not in acute distress.    Appearance: Normal appearance. She is well-developed. She is not ill-appearing or diaphoretic.  Neck:      Thyroid: No thyromegaly.     Vascular: No JVD.     Trachea: No tracheal deviation.   Cardiovascular:     Rate and Rhythm: Normal rate and regular rhythm.     Heart sounds: Normal heart sounds. No murmur heard.  No friction rub. No gallop.   Pulmonary:     Effort: Pulmonary effort is normal. No respiratory distress.     Breath sounds: Normal breath sounds. No wheezing or rales.  Musculoskeletal:     Cervical back: Normal range of motion and neck supple. Tenderness present.  Neurological:     Mental Status: She is alert.      CLINICAL DATA:  Salivary gland enlargement. Right swollen mass, possible salivary gland versus lymph node, tender to touch. History provided by scanning technologist: Right-sided swelling/mass, salivary gland versus lymph node, tender to touch. Additional history provided by scanning technologist: Patient reports enlarged right salivary gland for 6 days.  EXAM: ULTRASOUND OF HEAD/NECK SOFT TISSUES  TECHNIQUE: Ultrasound examination of the head and neck soft tissues was performed in the area of clinical concern.  COMPARISON:  Neck CT 03/13/2019.  Brain MRI 11/06/2018.  FINDINGS: There is a 1.8 x 0.9 x 1.3 cm ovoid hypoechoic focus within the right submandibular region of concern. There is somewhat prominent color Doppler flow immediately adjacent. Additional small foci of internal color Doppler flow internally.  These results will be called to the ordering clinician or representative by the Radiologist Assistant, and communication documented in the PACS or [employer].  IMPRESSION: 1.8 x 0.9 x 1.3 cm ovoid hypoechoic focus in the right submandibular region of concern, as described. The sonographic imaging appearance is nonspecific and primary differential considerations include: An abnormal enlarged lymph node, a salivary gland mass or a component of the vascular malformation questioned on the prior neck CT of 02/21/2019.  Contrast-enhanced neck CT is recommended for further evaluation.   Electronically Signed   By: Kellie Simmering DO   On: 07/07/2020 19:05   No results found for any visits on 07/02/20.  Assessment & Plan     1. Salivary gland enlargement DDx: enlarged lymph node, enlarged submandibular gland, recurrent parotid mass. Will get [place] as below. Patient has appt with Dr. Pryor Ochoa, ENT, already. Can try warm compresses and sour candies in case possible stone (although none appreciated on exam). Call if worsening before following up with ENT.  - US Soft Tissue Head/Neck (NON-THYROID); Future  2. 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 Had biopsy last year.    No follow-ups on file.      Artist Pais  Dorothy Puffer, PA-C, have reviewed all documentation for this visit. The documentation on 07/08/20 for the exam, diagnosis, procedures, and orders are all accurate and complete.   Rubye Beach  [facility] [phone] (phone) [phone] (fax)  [place]

## 2020-07-07 ENCOUNTER — Other Ambulatory Visit: Payer: Self-pay

## 2020-07-07 ENCOUNTER — Ambulatory Visit
Admission: RE | Admit: 2020-07-07 | Discharge: 2020-07-07 | Disposition: A | Discharge status: Home / Self Care | Payer: BC Managed Care – PPO | Source: Ambulatory Visit | Attending: Physician Assistant | Admitting: Physician Assistant

## 2020-07-07 DIAGNOSIS — K111 Hypertrophy of salivary gland: Secondary | ICD-10-CM

## 2020-07-08 ENCOUNTER — Encounter: Payer: Self-pay | Admitting: Physician Assistant

## 2020-07-09 ENCOUNTER — Other Ambulatory Visit: Payer: Self-pay | Admitting: Physician Assistant

## 2020-07-09 DIAGNOSIS — R221 Localized swelling, mass and lump, neck: Secondary | ICD-10-CM

## 2020-07-12 ENCOUNTER — Encounter: Payer: Self-pay | Admitting: Physician Assistant

## 2020-07-16 ENCOUNTER — Ambulatory Visit (INDEPENDENT_AMBULATORY_CARE_PROVIDER_SITE_OTHER): Payer: BC Managed Care – PPO | Admitting: Dermatology

## 2020-07-16 ENCOUNTER — Other Ambulatory Visit: Payer: Self-pay

## 2020-07-16 ENCOUNTER — Other Ambulatory Visit (HOSPITAL_COMMUNITY): Payer: Self-pay | Admitting: Otolaryngology

## 2020-07-16 ENCOUNTER — Other Ambulatory Visit: Payer: Self-pay | Admitting: Otolaryngology

## 2020-07-16 DIAGNOSIS — Q2739 Arteriovenous malformation, other site: Secondary | ICD-10-CM

## 2020-07-16 DIAGNOSIS — C4441 Basal cell carcinoma of skin of scalp and neck: Secondary | ICD-10-CM | POA: Diagnosis not present

## 2020-07-16 MED ORDER — MUPIROCIN 2 % EX OINT: 1.0000 "application " | TOPICAL_OINTMENT | Freq: Two times a day (BID) | CUTANEOUS | 0 refills | Status: DC

## 2020-07-16 NOTE — Progress Notes (Signed)
   Follow-Up Visit   Subjective  Amanda Day is a 54 y.o. female who presents for the following: Skin Cancer (Bx proven BCC. Left ant. neck. Here for excision.).    The following portions of the chart were reviewed this encounter and updated as appropriate: Tobacco  Allergies  Meds  Problems  Med Hx  Surg Hx  Fam Hx      Review of Systems: No other skin or systemic complaints except as noted in HPI or Assessment and Plan.  Objective  Well appearing patient in no apparent distress; mood and affect are within normal limits.  A focused examination was performed including face, neck, hands. Relevant physical exam findings are noted in the Assessment and Plan.  Objective  left Neck - Anterior: Bx proven BCC. Pink macule.  Assessment & Plan  Basal cell carcinoma (BCC) of skin of neck left Neck - Anterior  Skin excision  Lesion length (cm):  0.5 Lesion width (cm):  0.5 Margin per side (cm):  0.4 Total excision diameter (cm):  1.3 Informed consent: discussed and consent obtained   Timeout: patient name, date of birth, surgical site, and procedure verified   Procedure prep:  Patient was prepped and draped in usual sterile fashion Prep type:  Chlorhexidine Anesthesia: the lesion was anesthetized in a standard fashion   Anesthetic:  1% lidocaine w/ epinephrine 1-100,000 buffered w/ 8.4% NaHCO3 Instrument used: #15 blade   Hemostasis achieved with: pressure and electrodesiccation   Outcome: patient tolerated procedure well with no complications   Post-procedure details: sterile dressing applied and wound care instructions given   Dressing type: pressure dressing   Additional details:  5cc lidocaine/epi  mupirocin ointment (BACTROBAN) 2 %  Skin repair Complexity:  Intermediate Final length (cm):  4.3 Informed consent: discussed and consent obtained   Timeout: patient name, date of birth, surgical site, and procedure verified   Procedure prep:  Patient was prepped  and draped in usual sterile fashion Prep type:  Chlorhexidine Anesthesia: the lesion was anesthetized in a standard fashion   Anesthetic:  1% lidocaine w/ epinephrine 1-100,000 local infiltration Reason for type of repair: reduce tension to allow closure, reduce the risk of dehiscence, infection, and necrosis, preserve normal anatomy and preserve normal anatomical and functional relationships   Undermining: edges undermined   Subcutaneous layers (deep stitches):  Suture size:  4-0 Suture type: Vicryl (polyglactin 910)   Fine/surface layer approximation (top stitches):  Suture size:  5-0 Suture type: Prolene (polypropylene)   Suture removal (days):  7 Hemostasis achieved with: suture, pressure and electrodesiccation Outcome: patient tolerated procedure well with no complications   Post-procedure details: wound care instructions given   Additional details:  Mupirocin and a pressure dressing applied  Specimen 1 - Surgical pathology Differential Diagnosis: R/O residual BCC Check Margins: Yes Bx proven BCC. Pink macule. 1.3cm Previous Bx: [phone]  Start Mupirocin 2% ointment to aa QD-BID until follow up appointment.   Return in about 1 week (around 07/23/2020) for suture removal..   I, Emelia Salisbury, CMA, am acting as scribe for Forest Gleason, MD.  Documentation: I have reviewed the above documentation for accuracy and completeness, and I agree with the above.  Forest Gleason, MD

## 2020-07-16 NOTE — Patient Instructions (Signed)

## 2020-07-21 ENCOUNTER — Encounter: Payer: Self-pay | Admitting: Dermatology

## 2020-07-21 NOTE — Progress Notes (Signed)
Skin (M), left neck-anterior RESIDUAL BASAL CELL CARCINOMA, MARGINS FREE --> NTD, already excised

## 2020-07-23 ENCOUNTER — Other Ambulatory Visit: Payer: Self-pay

## 2020-07-23 ENCOUNTER — Ambulatory Visit (INDEPENDENT_AMBULATORY_CARE_PROVIDER_SITE_OTHER): Payer: BC Managed Care – PPO | Admitting: Dermatology

## 2020-07-23 DIAGNOSIS — L231 Allergic contact dermatitis due to adhesives: Secondary | ICD-10-CM

## 2020-07-23 DIAGNOSIS — L2489 Irritant contact dermatitis due to other agents: Secondary | ICD-10-CM

## 2020-07-23 MED ORDER — TRIAMCINOLONE ACETONIDE 0.1 % EX CREA: 1.0000 "application " | TOPICAL_CREAM | Freq: Two times a day (BID) | CUTANEOUS | 0 refills | Status: DC | PRN

## 2020-07-23 NOTE — Patient Instructions (Signed)
Topical steroids (such as triamcinolone, fluocinolone, fluocinonide, mometasone, clobetasol, halobetasol, betamethasone, hydrocortisone) can cause thinning and lightening of the skin if they are used for too long in the same area. Your physician has selected the right strength medicine for your problem and area affected on the body. Please use your medication only as directed by your physician to prevent side effects.   Serica scar gel

## 2020-07-23 NOTE — Progress Notes (Signed)
   Follow-Up Visit   Subjective  Amanda Day is a 54 y.o. female who presents for the following: Follow-up (Patient here today for suture removal at left neck. ).  Biopsy proven BCC excised 07/16/20. She reports irritation around the biopsy site.  The following portions of the chart were reviewed this encounter and updated as appropriate:   Tobacco  Allergies  Meds  Problems  Med Hx  Surg Hx  Fam Hx      Review of Systems:  No other skin or systemic complaints except as noted in HPI or Assessment and Plan.  Objective  Well appearing patient in no apparent distress; mood and affect are within normal limits.  A focused examination was performed including neck. Relevant physical exam findings are noted in the Assessment and Plan.   Assessment & Plan  Irritant contact dermatitis due to other agents Left Neck  Start TMC 0.1% cream twice daily for a week. Avoid applying to face, groin, and axilla. Use as directed. Risk of skin atrophy with long-term use reviewed.   Topical steroids (such as triamcinolone, fluocinolone, fluocinonide, mometasone, clobetasol, halobetasol, betamethasone, hydrocortisone) can cause thinning and lightening of the skin if they are used for too long in the same area. Your physician has selected the right strength medicine for your problem and area affected on the body. Please use your medication only as directed by your physician to prevent side effects.    Ordered Medications: triamcinolone (KENALOG) 0.1 %   Encounter for Removal of Sutures - Incision site at the left neck is clean, dry and intact - Wound cleansed, sutures removed, wound cleansed and steri strips applied.  - Discussed pathology results showing margins free  - Patient advised to keep steri-strips dry until they fall off. - Scars remodel for a full year. - Once steri-strips fall off, patient can apply over-the-counter silicone scar cream each night to help with scar remodeling if  desired. - Patient advised to call with any concerns or if they notice any new or changing lesions.   Return for TBSE as scheduled .  Graciella Belton, RMA, am acting as scribe for Forest Gleason, MD .  Documentation: I have reviewed the above documentation for accuracy and completeness, and I agree with the above.  Forest Gleason, MD

## 2020-07-29 ENCOUNTER — Ambulatory Visit
Admission: RE | Admit: 2020-07-29 | Discharge: 2020-07-29 | Disposition: A | Discharge status: Home / Self Care | Payer: BC Managed Care – PPO | Source: Ambulatory Visit | Attending: Otolaryngology | Admitting: Otolaryngology

## 2020-07-29 ENCOUNTER — Other Ambulatory Visit: Payer: Self-pay

## 2020-07-29 DIAGNOSIS — Q2739 Arteriovenous malformation, other site: Secondary | ICD-10-CM | POA: Diagnosis present

## 2020-07-29 MED ORDER — GADOBUTROL 1 MMOL/ML IV SOLN
9.0000 mL | Freq: Once | INTRAVENOUS | Status: AC | PRN
  Administered 2020-07-29: 9 mL via INTRAVENOUS

## 2020-08-20 ENCOUNTER — Encounter: Payer: Self-pay | Admitting: Dermatology

## 2020-10-30 ENCOUNTER — Ambulatory Visit: Payer: BC Managed Care – PPO | Admitting: Dermatology

## 2020-10-30 ENCOUNTER — Other Ambulatory Visit: Payer: Self-pay

## 2020-10-30 ENCOUNTER — Encounter: Payer: Self-pay | Admitting: Dermatology

## 2020-10-30 DIAGNOSIS — Z1283 Encounter for screening for malignant neoplasm of skin: Secondary | ICD-10-CM | POA: Diagnosis not present

## 2020-10-30 DIAGNOSIS — D229 Melanocytic nevi, unspecified: Secondary | ICD-10-CM

## 2020-10-30 DIAGNOSIS — L821 Other seborrheic keratosis: Secondary | ICD-10-CM

## 2020-10-30 DIAGNOSIS — L578 Other skin changes due to chronic exposure to nonionizing radiation: Secondary | ICD-10-CM

## 2020-10-30 DIAGNOSIS — L814 Other melanin hyperpigmentation: Secondary | ICD-10-CM

## 2020-10-30 DIAGNOSIS — Z85828 Personal history of other malignant neoplasm of skin: Secondary | ICD-10-CM | POA: Diagnosis not present

## 2020-10-30 DIAGNOSIS — D1800 Hemangioma unspecified site: Secondary | ICD-10-CM

## 2020-10-30 NOTE — Patient Instructions (Addendum)
Melanoma ABCDEs  Melanoma is the most dangerous type of skin cancer, and is the leading cause of death from skin disease.  You are more likely to develop melanoma if you:  Have light-colored skin, light-colored eyes, or red or blond hair  Spend a lot of time in the sun  Tan regularly, either outdoors or in a tanning bed  Have had blistering sunburns, especially during childhood  Have a close family member who has had a melanoma  Have atypical moles or large birthmarks  Early detection of melanoma is key since treatment is typically straightforward and cure rates are extremely high if we catch it early.   The first sign of melanoma is often a change in a mole or a new dark spot.  The ABCDE system is a way of remembering the signs of melanoma.  A for asymmetry:  The two halves do not match. B for border:  The edges of the growth are irregular. C for color:  A mixture of colors are present instead of an even brown color. D for diameter:  Melanomas are usually (but not always) greater than 78mm - the size of a pencil eraser. E for evolution:  The spot keeps changing in size, shape, and color.  Please check your skin once per month between visits. You can use a small mirror in front and a large mirror behind you to keep an eye on the back side or your body.   If you see any new or changing lesions before your next follow-up, please call to schedule a visit.  Please continue daily skin protection including broad spectrum sunscreen SPF 30+ to sun-exposed areas, reapplying every 2 hours as needed when you're outdoors.    Recommend OTC Gold Bond Rapid Relief Anti-Itch cream (pramoxine + menthol) up to 3 times per day to areas that are itchy.   Recommend Nicotinamide 500mg  twice per day to lower risk of non-melanoma skin cancer by approximately 25%.    Recommend taking Heliocare sun protection supplement daily in sunny weather for additional sun protection. For maximum protection on the  sunniest days, you can take up to 2 capsules of regular Heliocare OR take 1 capsule of Heliocare Ultra. For prolonged exposure (such as a full day in the sun), you can repeat your dose of the supplement 4 hours after your first dose. Heliocare can be purchased at [facility] or at [email or web address].

## 2020-10-30 NOTE — Progress Notes (Signed)
   Follow-Up Visit   Subjective  Amanda Day is a 55 y.o. female who presents for the following: TBSE (Patient here for full body skin exam and skin cancer screening. Patient with hx of BCC, not aware of any new or changing spots. ).  The following portions of the chart were reviewed this encounter and updated as appropriate:   Tobacco  Allergies  Meds  Problems  Med Hx  Surg Hx  Fam Hx      Review of Systems:  No other skin or systemic complaints except as noted in HPI or Assessment and Plan.  Objective  Well appearing patient in no apparent distress; mood and affect are within normal limits.  A full examination was performed including scalp, head, eyes, ears, nose, lips, neck, chest, axillae, abdomen, back, buttocks, bilateral upper extremities, bilateral lower extremities, hands, feet, fingers, toes, fingernails, and toenails. All findings within normal limits unless otherwise noted below.    Assessment & Plan    Lentigines - Scattered tan macules - Due to sun exposure - Benign-appering, observe - Recommend daily broad spectrum sunscreen SPF 30+ to sun-exposed areas, reapply every 2 hours as needed. - Call for any changes  Seborrheic Keratoses - Stuck-on, waxy, tan-brown papules and plaques  - Discussed benign etiology and prognosis. - Observe - Call for any changes  Melanocytic Nevi - Tan-brown and/or pink-flesh-colored symmetric macules and papules - Benign appearing on exam today - Observation - Call clinic for new or changing moles - Recommend daily use of broad spectrum spf 30+ sunscreen to sun-exposed areas.   Hemangiomas - Red papules - Discussed benign nature - Observe - Call for any changes  Actinic Damage - Chronic, secondary to cumulative UV/sun exposure - diffuse scaly erythematous macules with underlying dyspigmentation - Recommend daily broad spectrum sunscreen SPF 30+ to sun-exposed areas, reapply every 2 hours as needed.  - Call for  new or changing lesions.  Skin cancer screening performed today.  History of Basal Cell Carcinoma of the Skin - No evidence of recurrence today at left ant neck, 04/2020 - Recommend regular full body skin exams - Recommend daily broad spectrum sunscreen SPF 30+ to sun-exposed areas, reapply every 2 hours as needed.  - Call if any new or changing lesions are noted between office visits   Return in about 6 months (around 05/02/2021) for TBSE.  Graciella Belton, RMA, am acting as scribe for Forest Gleason, MD .  Documentation: I have reviewed the above documentation for accuracy and completeness, and I agree with the above.  Forest Gleason, MD

## 2020-11-01 ENCOUNTER — Encounter: Payer: Self-pay | Admitting: Physician Assistant

## 2020-11-18 IMAGING — MR MR BRAIN/IAC WITHOUT AND WITH CONTRAST
9 of 13 series · 25 of 48 positions shown · IV contrast (gadavist)
Comparison: None.

CLINICAL DATA: 53-year-old female with partial right ear hearing
loss for 2 months with tinnitus. Asymmetric sensorineural hearing
loss.

EXAM:
MRI HEAD WITHOUT AND WITH CONTRAST
TECHNIQUE: Multiplanar, multiecho pulse sequences of the brain and surrounding
structures were obtained without and with intravenous contrast.
CONTRAST:  10 milliliters Gadavist

[Series 6: ax [id number] · axial · 3.0mm · 0.60mm/px · z∈[-43,+118]mm · 4 of 55 slices shown]
[im 1/55]
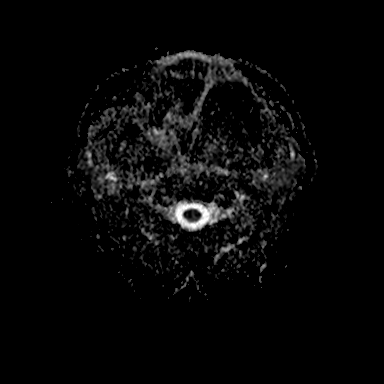
[im 19/55]
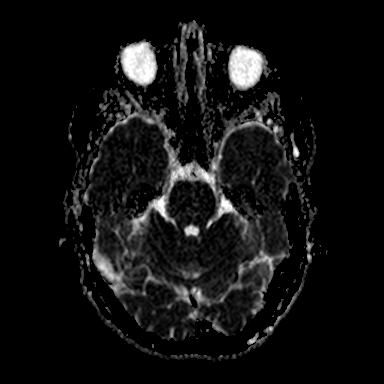
[im 37/55]
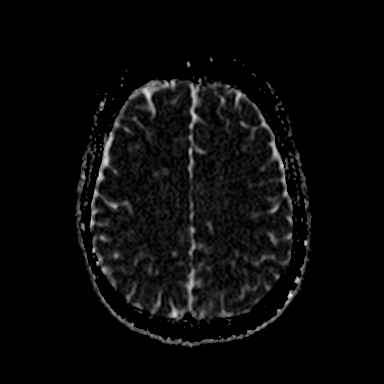
[im 55/55]
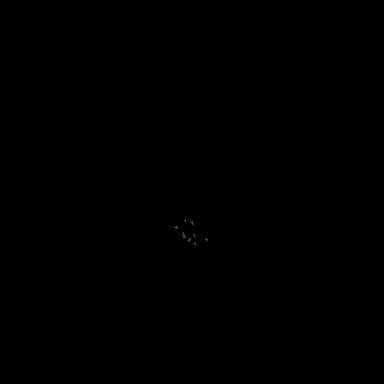

[Series 8: T1 · sagittal · 5.0mm · 0.62mm/px · 2 of 21 slices shown (1 of 3)]
[im 1/21]
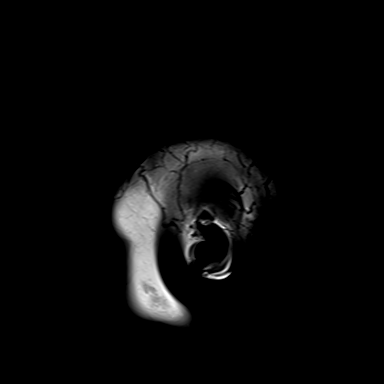
[im 21/21]
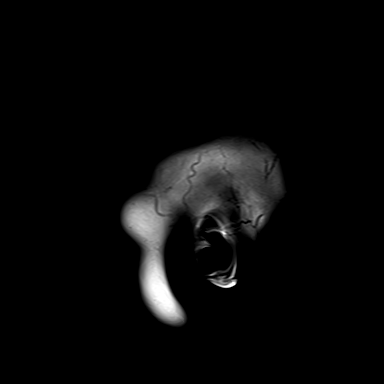

[Series 9: T2 · axial · 5.0mm · 0.53mm/px · z∈[-40,+115]mm · 2 of 27 slices shown]
[im 1/27]
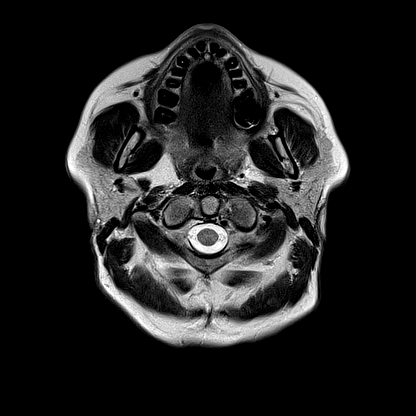
[im 27/27]
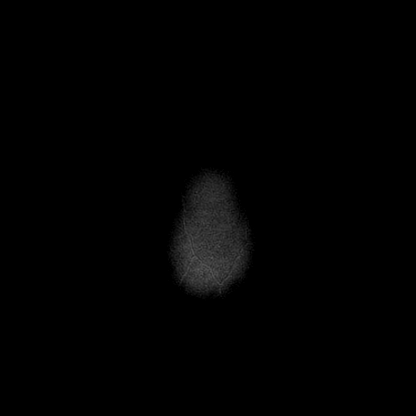

[Series 14: FLAIR · axial · 3.0mm · 0.53mm/px · z∈[-43,+118]mm · 4 of 55 slices shown]
[im 1/55]
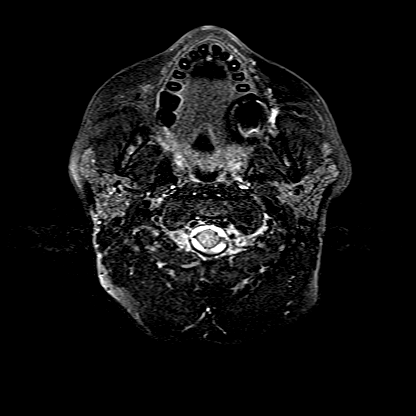
[im 19/55]
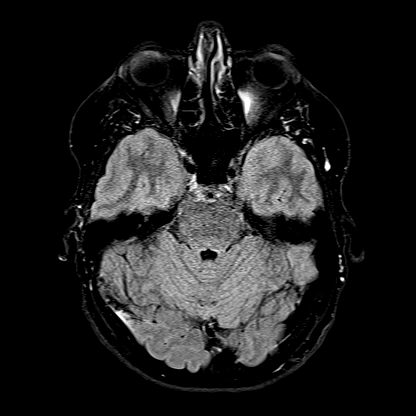
[im 37/55]
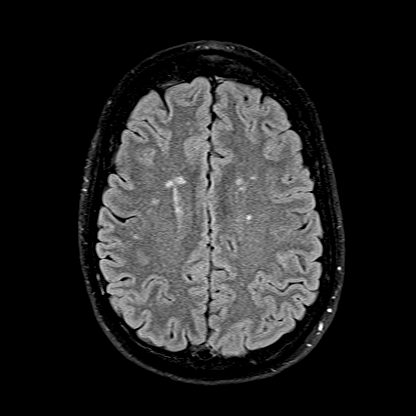
[im 55/55]
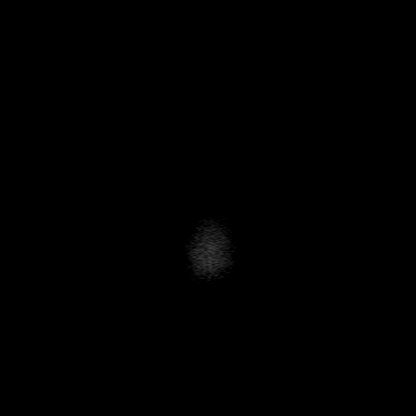

[Series 15: T1 · coronal · non-contrast · 3.0mm · 0.21mm/px · 1 of 13 slices shown (2 of 3)]
[im 1/13]
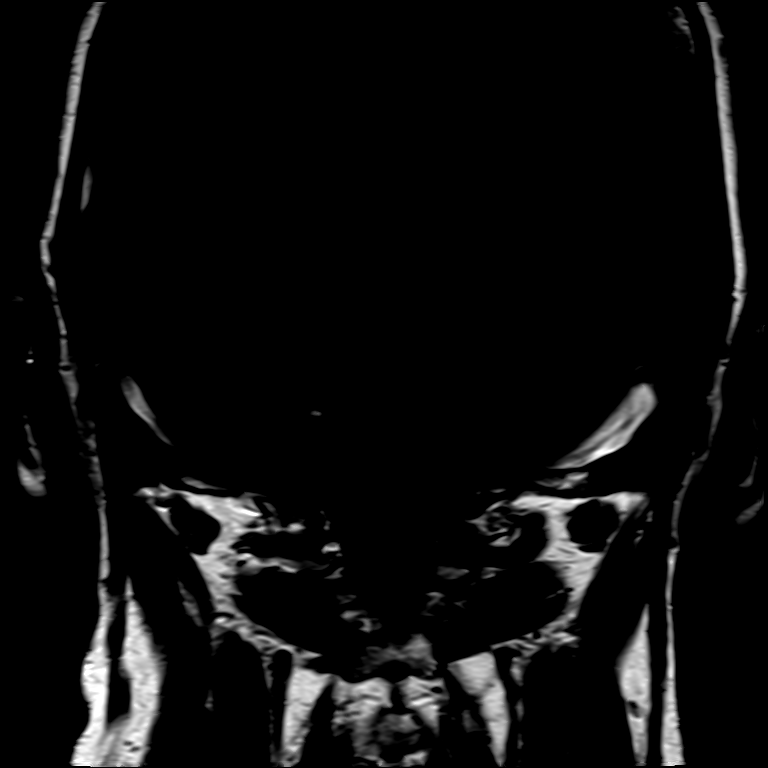

[Series 17: T1 · axial · non-contrast · 3.0mm · 0.21mm/px · 1 of 15 slices shown (3 of 3)]
[im 1/15]
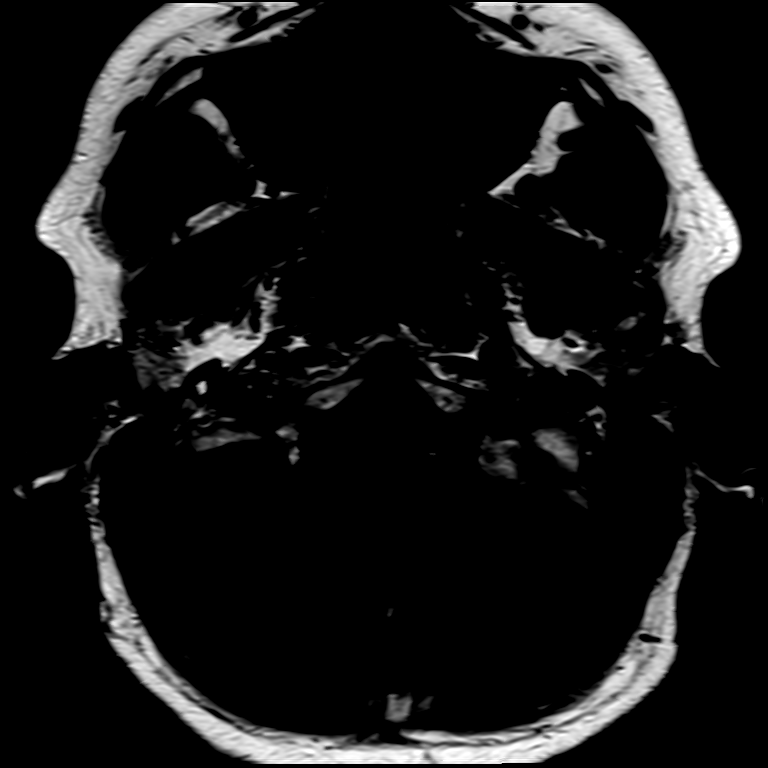

[Series 18: T1 post-contrast · axial · 3.0mm · 0.21mm/px · 1 of 15 slices shown (1 of 3)]
[im 1/15]
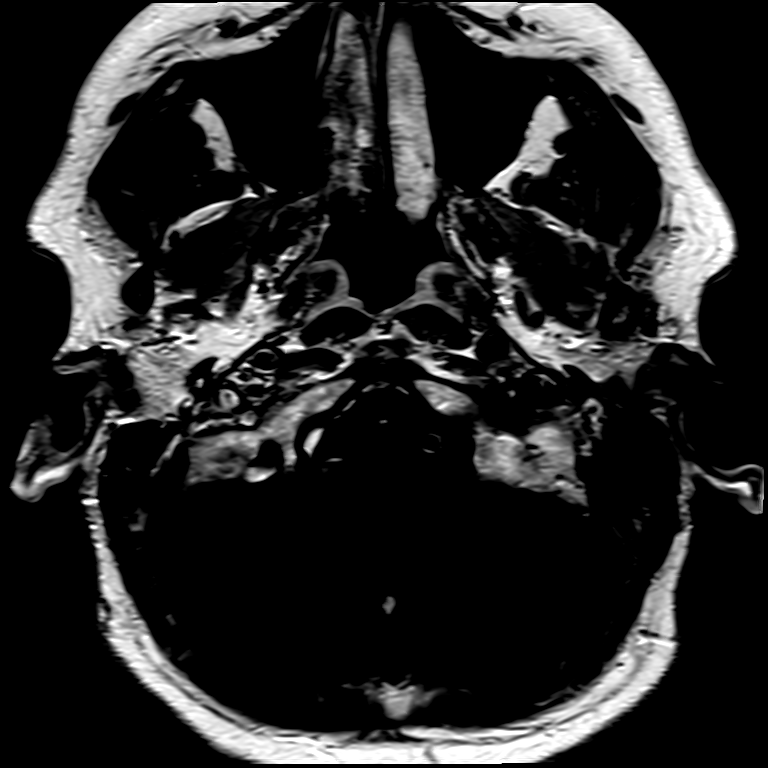

[Series 19: T1 post-contrast · coronal · 3.0mm · 0.21mm/px · 1 of 13 slices shown (2 of 3)]
[im 1/13]
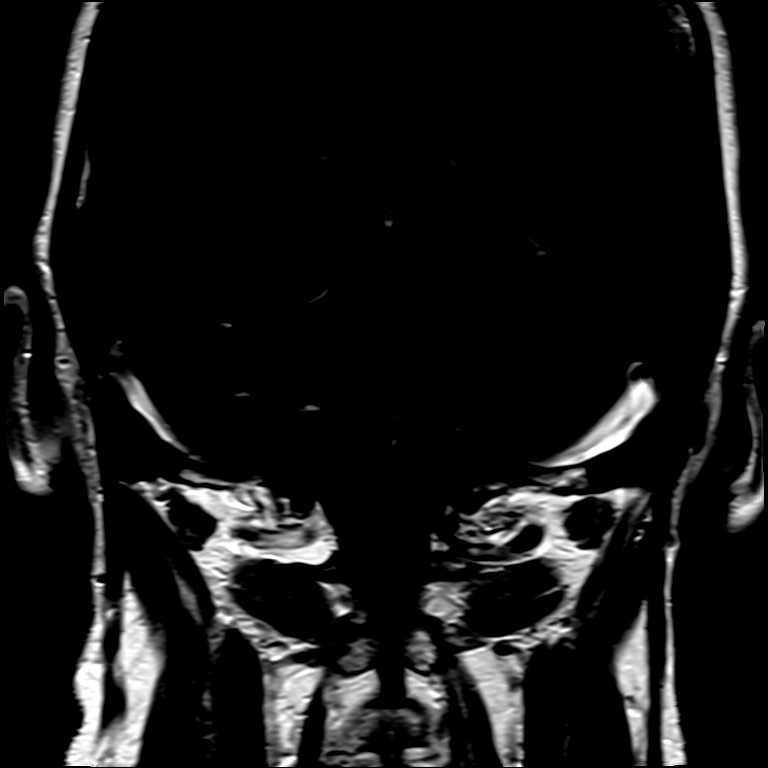

[Series 20: T1 post-contrast · axial · 1.0mm · 0.98mm/px · z∈[-49,+125]mm · 9 of 175 slices shown (3 of 3)]
[im 1/175]
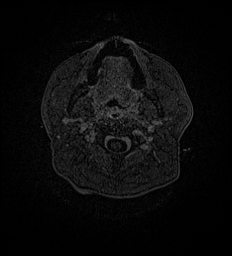
[im 30/175]
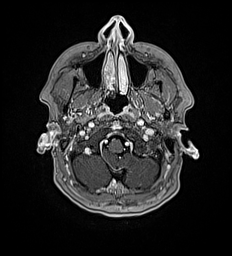
[im 59/175]
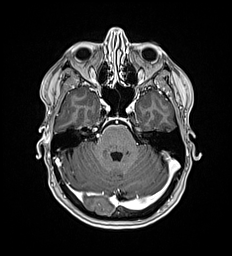
[im 73/175]
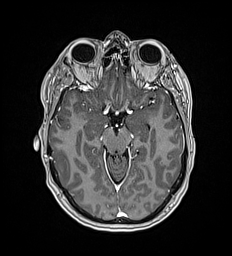
[im 88/175]
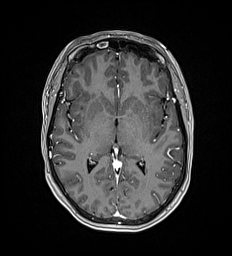
[im 102/175]
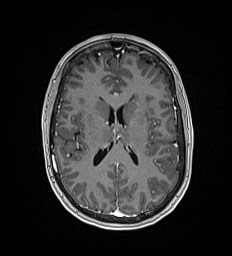
[im 117/175]
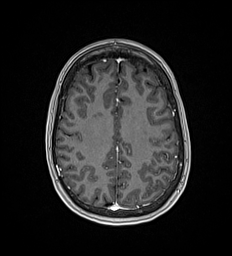
[im 146/175]
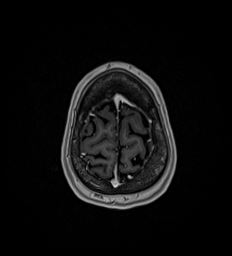
[im 175/175]
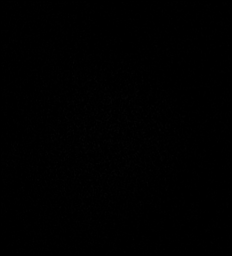

[25 of 48 positions shown; findings below may reference images not displayed]

FINDINGS: Brain: Normal cerebral volume. No restricted diffusion to suggest
acute infarction. No midline shift, mass effect, evidence of mass
lesion, ventriculomegaly, extra-axial collection or acute
intracranial hemorrhage. Cervicomedullary junction and pituitary are
within normal limits.

Patchy and scattered bilateral cerebral white matter T2 and FLAIR
hyperintensity, abnormal but in a nonspecific configuration. This is
most pronounced near the anterior deep white matter capsules and
lentiform nuclei, more so on the left (series 14, image 33).

No cortical encephalomalacia. No chronic cerebral blood products.
Deep gray matter nuclei remain within normal limits. Brainstem and
cerebellum remain within normal limits.

No abnormal enhancement identified.  No dural thickening identified.

Vascular: Major intracranial vascular flow voids are preserved. The
left vertebral artery appears dominant.

Skull and upper cervical spine: Negative visible cervical spine.
Normal bone marrow signal.

Sinuses/Orbits: Negative orbits. Trace paranasal sinus mucosal
thickening.

Scalp and face soft tissues appear negative.

Other: Dedicated internal auditory imaging. Normal cerebellopontine
angles. Normal bilateral cisternal and intracanalicular 7th and 8th
cranial nerve segments. Tortuosity of the right AICA, but no nerve
root entry zone mass effect. Symmetric T2 signal in the bilateral
cochlea and vestibular structures. Mastoids are clear. No abnormal
enhancement identified. Stylomastoid foramina appear normal. There
is a 15 millimeter area of asymmetric nodularity along the inferior
right parotid gland seen on series 8, image 5 and series 15, image
10 which seems to enhance following contrast. Otherwise both parotid
glands appear normal.
IMPRESSION: 1. Negative internal auditory imaging.
2. Moderately advanced for age but nonspecific cerebral white matter
signal changes. Chronic small vessel disease is most common. Other
considerations include sequelae of trauma, hypercoagulable state,
vasculitis, migraines, prior infection, or demyelination.
3. Possible 15 mm enhancing soft tissue nodule along the inferior
margin of the right parotid gland is suspicious for a small primary
salivary neoplasm. Consider limited Neck Ultrasound versus Neck CT
(IV contrast preferred) to confirm.

## 2020-12-12 ENCOUNTER — Ambulatory Visit: Payer: Self-pay | Admitting: *Deleted

## 2020-12-12 NOTE — Telephone Encounter (Signed)
Pt stated she just came back from a trip to [place], took a COVID at home test today ,which was Positive, pt stated she has all the symptoms as well, cough, congestion, headache, runny nose. Pt just wanted some advice on what to do next. Please advise.   Called patient to review symptoms. C/o testing postive for covid after returning from conference in [place] and symptoms started on 12/09/20 Wednesday. C/o worsening dry cough, congestion, runny nose, headaches. Denies fever, chest pain , difficulty breathing, loss of taste or smell. Reviewed CDC guidelines with patient. Criteria for self-isolation if you test positive for COVID-19, regardless of vaccination status:  -If you have mild symptoms that are resolving or have resolved, isolate at home for 5 days since symptoms started AND continue to wear a well-fitted mask when around others in the home and in public for 5 additional days after isolation is completed -If you have a fever and/or moderate to severe symptoms, isolate for at least 10 days since the symptoms started AND until you are fever free for at least 24 hours without the use of fever-reducing medications -If you tested positive and did not have symptoms, isolate for at least 5 days after your positive test  Use over-the-counter medications for symptoms.If you develop respiratory issues/distress, seek medical care in the Emergency Department.  If you must leave home or if you have to be around others please wear a mask. Please limit contact with immediate family members in the home, practice social distancing, frequent handwashing and clean hard surfaces touched frequently with household cleaning products. Patient requesting if she qualifies for pill form for covid. Patient would like to know if she can still wear her CPAP at night. Please advise. Care advise given. Patient verbalized understanding of care advise and to call back or go to [facility] or ED if symptoms worsen.    Reason for  Disposition . COVID-19 Home Isolation, questions about  Answer Assessment - Initial Assessment Questions 1. COVID-19 DIAGNOSIS: "Who made your COVID-19 diagnosis?" "Was it confirmed by a positive lab test or self-test?" If not diagnosed by a doctor (or NP/PA), ask "Are there lots of cases (community spread) where you live?" Note: See public health department website, if unsure.     Home test positive  2. COVID-19 EXPOSURE: "Was there any known exposure to COVID before the symptoms began?" CDC Definition of close contact: within 6 feet (2 meters) for a total of 15 minutes or more over a 24-hour period.      No . Came back from conference  3. ONSET: "When did the COVID-19 symptoms start?"      Wednesday 12/09/20 4. WORST SYMPTOM: "What is your worst symptom?" (e.g., cough, fever, shortness of breath, muscle aches)     Cough, runny nose, headache  5. COUGH: "Do you have a cough?" If Yes, ask: "How bad is the cough?"       Dry cough  6. FEVER: "Do you have a fever?" If Yes, ask: "What is your temperature, how was it measured, and when did it start?"     No  7. RESPIRATORY STATUS: "Describe your breathing?" (e.g., shortness of breath, wheezing, unable to speak)      Shortness of breath after coughing  8. BETTER-SAME-WORSE: "Are you getting better, staying the same or getting worse compared to yesterday?"  If getting worse, ask, "In what way?"     Worse  9. HIGH RISK DISEASE: "Do you have any chronic medical problems?" (e.g., asthma, heart  or lung disease, weak immune system, obesity, etc.)     High blood pressure and wears CPAP at night  10. VACCINE: "Have you had the COVID-19 vaccine?" If Yes, ask: "Which one, how many shots, when did you get it?"       Yes Pfizer  11. BOOSTER: "Have you received your COVID-19 booster?" If Yes, ask: "Which one and when did you get it?"       3rd pfizer  12. PREGNANCY: "Is there any chance you are pregnant?" "When was your last menstrual period?"       na 13.  OTHER SYMPTOMS: "Do you have any other symptoms?"  (e.g., chills, fatigue, headache, loss of smell or taste, muscle pain, sore throat)       Headache, dry cough, runny nose, congestion 14. O2 SATURATION MONITOR:  "Do you use an oxygen saturation monitor (pulse oximeter) at home?" If Yes, ask "What is your reading (oxygen level) today?" "What is your usual oxygen saturation reading?" (e.g., 95%)       na  Protocols used: CORONAVIRUS (COVID-19) DIAGNOSED OR SUSPECTED-A-AH

## 2020-12-15 NOTE — Telephone Encounter (Signed)
Yes, she should continue CPAP. She is very low risk for serious covid infection or complication since she has had three doses of vaccine. Should clear up with rest and lots of fluids.

## 2020-12-15 NOTE — Telephone Encounter (Signed)
Patient has been advised. KW 

## 2020-12-15 NOTE — Telephone Encounter (Signed)
Please advise if okay to continue to wear CPap?KW

## 2020-12-19 ENCOUNTER — Ambulatory Visit: Payer: Self-pay | Admitting: *Deleted

## 2020-12-19 NOTE — Telephone Encounter (Signed)
I returned pt's call.  She wanted to touch base about continuing symptoms of dry hacking cough, runny nose and mild headache that is intermittent after testing positive for covid on 12/12/2020.   No fever and feels better. She is using Dayquill which is helping with the congestion and cough.   Using Tylenol for the intermittent headaches.  I went over the care advice with her.  She will see how she does over the weekend and if she just doesn't seem to be getting better she will call us back next week.  I instructed her to call us back if she develops fever, blowing green mucus from her nose or coughs up thick mucus/green mucus or just starts feeling worse.   She verbalized understanding and was agreeable to this plan. She thanked me for calling her back.    Reason for Disposition . [1] PERSISTING SYMPTOMS OF COVID-19 AND [2] symptoms BETTER (improving)  Answer Assessment - Initial Assessment Questions 1. COVID-19 ONSET: "When did the symptoms of COVID-19 first start?"     Started 12/10/2020   Still having congestion and cough and a headache. 2. DIAGNOSIS CONFIRMATION: "How were you diagnosed?" (e.g., COVID-19 oral or nasal viral test; COVID-19 antibody test; doctor visit)     Home test positive.  2nd test positive last night 3. MAIN SYMPTOM:  "What is your main concern or symptom right now?" (e.g., breathing difficulty, cough, fatigue. loss of smell)     Coughing not going away.   Dayquill helps a lot.    4. SYMPTOM ONSET: "When did the  coughing  start?"     12/10/2020  Coughing up mucus in mornings but other wise dry. 5. BETTER-SAME-WORSE: "Are you getting better, staying the same, or getting worse over the last 1 to 2 weeks?"     Better except just still coughing.   6. RECENT MEDICAL VISIT: "Have you been seen by a healthcare provider (doctor, NP, PA) for these persisting COVID-19 symptoms?" If Yes, ask: "When were you seen?" (e.g., date)     No  I called into the office and made them aware  I had covid and if they suggested anything to do.   Since I'm low risk I was advised to stay hydrated and rest. 7. COUGH: "Do you have a cough?" If Yes, ask: "How bad is the cough?"       Yes a dry hacking cough except in the morning it's a clear mucus first thing. 8. FEVER: "Do you have a fever?" If Yes, ask: "What is your temperature, how was it measured, and when did it start?"     *No Answer* 9. BREATHING DIFFICULTY: "Are you having any trouble breathing?" If Yes, ask: "How bad is your breathing?" (e.g., mild, moderate, severe)    - MILD: No SOB at rest, mild SOB with walking, speaks normally in sentences, can lie down, no retractions, pulse < 100.    - MODERATE: SOB at rest, SOB with minimal exertion and prefers to sit, cannot lie down flat, speaks in phrases, mild retractions, audible wheezing, pulse 100-120.    - SEVERE: Very SOB at rest, speaks in single words, struggling to breathe, sitting hunched forward, retractions, pulse > 120       No shortness of breath 10. HIGH RISK DISEASE: "Do you have any chronic medical problems?" (e.g., asthma, heart or lung disease, weak immune system, obesity, etc.)       No just hypertension. 11. VACCINE: "Have you gotten the COVID-19 vaccine?" If  Yes, ask: "Which one, how many shots, when did you get it?"       Yes had 2 covid vaccines 12. BOOSTER: "Have you received your COVID-19 booster?" If Yes, ask: "Which one and when did you get it?"       Yes had a booster. 13. PREGNANCY: "Is there any chance you are pregnant?" "When was your last menstrual period?"       Not asked due to age 55. OTHER SYMPTOMS: "Do you have any other symptoms?"  (e.g., fatigue, headache, muscle pain, weakness)       Headache on and off that Tylenol helps  15. O2 SATURATION MONITOR:  "Do you use an oxygen saturation monitor (pulse oximeter) at home?" If Yes, ask "What is your reading (oxygen level) today?" "What is your usual oxygen saturation reading?" (e.g., 95%)        N/A  Protocols used: CORONAVIRUS (COVID-19) PERSISTING SYMPTOMS FOLLOW-UP CALL-A-AH

## 2020-12-25 ENCOUNTER — Ambulatory Visit: Payer: Self-pay | Admitting: *Deleted

## 2020-12-25 NOTE — Telephone Encounter (Signed)
C/o persistent symptoms of covid . Symptoms started on 12/10/20 and tested positive with at home test on 12/12/20. Patient is calling and c/o chronic dry cough, fatigue, shortness of breath with exercise . C/o chest heaviness at times. OTC medications not effective. Patient reports not coughing up sputum but feels sputum going down her throat. Denies fever, chest pain, difficulty breathing. No available virtual appt with Dr. Brita Romp until July. Patient reports she will see any provider if they will allow. Please contact patient if any available appt become available. Care advise given. Patient verbalized understanding of care advise and to call back or go to [facility] or ED if symptoms worsen.   Reason for Disposition . [1] PERSISTING SYMPTOMS OF COVID-19 AND [2] NO medical visit for COVID-19 in past 2 weeks  Answer Assessment - Initial Assessment Questions 1. COVID-19 ONSET: "When did the symptoms of COVID-19 first start?"     December 11 2019 2. DIAGNOSIS CONFIRMATION: "How were you diagnosed?" (e.g., COVID-19 oral or nasal viral test; COVID-19 antibody test; doctor visit)     12/12/20 at home test  3. MAIN SYMPTOM:  "What is your main concern or symptom right now?" (e.g., breathing difficulty, cough, fatigue. loss of smell)     Constant cough, chest heaviness,, 4. SYMPTOM ONSET: "When did the  coughing  start?"     12/10/20 5. BETTER-SAME-WORSE: "Are you getting better, staying the same, or getting worse over the last 1 to 2 weeks?"     Same  6. RECENT MEDICAL VISIT: "Have you been seen by a healthcare provider (doctor, NP, PA) for these persisting COVID-19 symptoms?" If Yes, ask: "When were you seen?" (e.g., date)     No  7. COUGH: "Do you have a cough?" If Yes, ask: "How bad is the cough?"       Yes constant dry cough  8. FEVER: "Do you have a fever?" If Yes, ask: "What is your temperature, how was it measured, and when did it start?"     na 9. BREATHING DIFFICULTY: "Are you having any trouble  breathing?" If Yes, ask: "How bad is your breathing?" (e.g., mild, moderate, severe)    - MILD: No SOB at rest, mild SOB with walking, speaks normally in sentences, can lie down, no retractions, pulse < 100.    - MODERATE: SOB at rest, SOB with minimal exertion and prefers to sit, cannot lie down flat, speaks in phrases, mild retractions, audible wheezing, pulse 100-120.    - SEVERE: Very SOB at rest, speaks in single words, struggling to breathe, sitting hunched forward, retractions, pulse > 120       Mild  10. HIGH RISK DISEASE: "Do you have any chronic medical problems?" (e.g., asthma, heart or lung disease, weak immune system, obesity, etc.)       No  11. VACCINE: "Have you gotten the COVID-19 vaccine?" If Yes, ask: "Which one, how many shots, when did you get it?"       Yes . 2 pfizer shots  12. BOOSTER: "Have you received your COVID-19 booster?" If Yes, ask: "Which one and when did you get it?"       1 pfizer booster  13. PREGNANCY: "Is there any chance you are pregnant?" "When was your last menstrual period?"       na 14. OTHER SYMPTOMS: "Do you have any other symptoms?"  (e.g., fatigue, headache, muscle pain, weakness)       Fatigue, cough, mild shortness of breath, chest heaviness 15. O2 SATURATION  MONITOR:  "Do you use an oxygen saturation monitor (pulse oximeter) at home?" If Yes, ask "What is your reading (oxygen level) today?" "What is your usual oxygen saturation reading?" (e.g., 95%)       na  Protocols used: CORONAVIRUS (COVID-19) PERSISTING SYMPTOMS FOLLOW-UP CALL-A-AH

## 2020-12-26 ENCOUNTER — Encounter: Payer: Self-pay | Admitting: Family Medicine

## 2020-12-26 ENCOUNTER — Ambulatory Visit: Payer: BC Managed Care – PPO | Admitting: Family Medicine

## 2020-12-26 ENCOUNTER — Other Ambulatory Visit: Payer: Self-pay

## 2020-12-26 VITALS — BP 130/84 | HR 80 | Temp 98.4°F | Wt 213.0 lb

## 2020-12-26 DIAGNOSIS — U071 COVID-19: Secondary | ICD-10-CM

## 2020-12-26 DIAGNOSIS — J40 Bronchitis, not specified as acute or chronic: Secondary | ICD-10-CM | POA: Diagnosis not present

## 2020-12-26 MED ORDER — PREDNISONE 10 MG PO TABS
ORAL_TABLET | ORAL | 0 refills | Status: DC
Start: 2020-12-26 — End: 2021-08-03

## 2020-12-26 MED ORDER — ALBUTEROL SULFATE HFA 108 (90 BASE) MCG/ACT IN AERS
2.0000 | INHALATION_SPRAY | Freq: Four times a day (QID) | RESPIRATORY_TRACT | 0 refills | Status: DC | PRN
Start: 2020-12-26 — End: 2022-08-05

## 2020-12-26 NOTE — Patient Instructions (Signed)
Acute Bronchitis, Adult  Acute bronchitis is sudden or acute swelling of the air tubes (bronchi) in the lungs. Acute bronchitis causes these tubes to fill with mucus, which can make it hard to breathe. It can also cause coughing or wheezing. In adults, acute bronchitis usually goes away within 2 weeks. A cough caused by bronchitis may last up to 3 weeks. Smoking, allergies, and asthma can make the condition worse. What are the causes? This condition can be caused by germs and by substances that irritate the lungs, including:  Cold and flu viruses. The most common cause of this condition is the virus that causes the common cold.  Bacteria.  Substances that irritate the lungs, including: ? Smoke from cigarettes and other forms of tobacco. ? Dust and pollen. ? Fumes from chemical products, gases, or burned fuel. ? Other materials that pollute indoor or outdoor air.  Close contact with someone who has acute bronchitis. What increases the risk? The following factors may make you more likely to develop this condition:  A weak body's defense system, also called the immune system.  A condition that affects your lungs and breathing, such as asthma. What are the signs or symptoms? Common symptoms of this condition include:  Lung and breathing problems, such as: ? Coughing. This may bring up clear, yellow, or green mucus from your lungs (sputum). ? Wheezing. ? Having too much mucus in your lungs (chest congestion). ? Having shortness of breath.  A fever.  Chills.  Aches and pains, including: ? Tightness in your chest and other body aches. ? A sore throat. How is this diagnosed? This condition is usually diagnosed based on:  Your symptoms and medical history.  A physical exam. You may also have other tests, including tests to rule out other conditions, such pneumonia. These tests include:  A test of lung function.  Test of a mucus sample to look for the presence of  bacteria.  Tests to check the oxygen level in your blood.  Blood tests.  Chest X-ray. How is this treated? Most cases of acute bronchitis clear up over time without treatment. Your health care provider may recommend:  Drinking more fluids. This can thin your mucus, which may improve your breathing.  Taking a medicine for a fever or cough.  Using a device that gets medicine into your lungs (inhaler) to help improve breathing and control coughing.  Using a vaporizer or a humidifier. These are machines that add water to the air to help you breathe better. Follow these instructions at home: Activity  Get plenty of rest.  Return to your normal activities as told by your health care provider. Ask your health care provider what activities are safe for you. Lifestyle  Drink enough fluid to keep your urine pale yellow.  Do not drink alcohol.  Do not use any products that contain nicotine or tobacco, such as cigarettes, e-cigarettes, and chewing tobacco. If you need help quitting, ask your health care provider. Be aware that: ? Your bronchitis will get worse if you smoke or breathe in other people's smoke (secondhand smoke). ? Your lungs will heal faster if you quit smoking. General instructions  Take over-the-counter and prescription medicines only as told by your health care provider.  Use an inhaler, vaporizer, or humidifier as told by your health care provider.  If you have a sore throat, gargle with a salt-water mixture 3-4 times a day or as needed. To make a salt-water mixture, completely dissolve -1 tsp (3-6 g)   of salt in 1 cup (237 mL) of warm water.  Keep all follow-up visits as told by your health care provider. This is important.   How is this prevented? To lower your risk of getting this condition again:  Wash your hands often with soap and water. If soap and water are not available, use hand sanitizer.  Avoid contact with people who have cold symptoms.  Try not to  touch your mouth, nose, or eyes with your hands.  Avoid places where there are fumes from chemicals. Breathing these fumes will make your condition worse.  Get the flu shot every year.   Contact a health care provider if:  Your symptoms do not improve after 2 weeks of treatment.  You vomit more than once or twice.  You have symptoms of dehydration such as: ? Dark urine. ? Dry skin or eyes. ? Increased thirst. ? Headaches. ? Confusion. ? Muscle cramps. Get help right away if you:  Cough up blood.  Feel pain in your chest.  Have severe shortness of breath.  Faint or keep feeling like you are going to faint.  Have a severe headache.  Have fever or chills that get worse. These symptoms may represent a serious problem that is an emergency. Do not wait to see if the symptoms will go away. Get medical help right away. Call your local emergency services (911 in the [place] Do not drive yourself to the hospital. Summary  Acute bronchitis is sudden (acute) inflammation of the air tubes (bronchi) between the windpipe and the lungs. In adults, acute bronchitis usually goes away within 2 weeks, although coughing may last 3 weeks or longer  Take over-the-counter and prescription medicines only as told by your health care provider.  Drink enough fluid to keep your urine pale yellow.  Contact a health care provider if your symptoms do not improve after 2 weeks of treatment.  Get help right away if you cough up blood, faint, or have chest pain or shortness of breath. This information is not intended to replace advice given to you by your health care provider. Make sure you discuss any questions you have with your health care provider. Document Revised: 04/23/2019 Document Reviewed: 03/02/2019 Elsevier Patient Education  2021 [employer]  

## 2020-12-26 NOTE — Progress Notes (Signed)
Established patient visit   Patient: Amanda Day   DOB: 1966-05-16   55 y.o. Female  MRN: [id number] Visit Date: 12/26/2020  Today's healthcare provider: Lavon Paganini, MD   Chief Complaint  Patient presents with  . Cough   Subjective    Cough This is a new problem. The current episode started 1 to 4 weeks ago (Pt tested positive for Covid 12/12/2020). The cough is productive of sputum. Associated symptoms include headaches, heartburn, postnasal drip, shortness of breath and wheezing. Pertinent negatives include no chest pain, chills, ear congestion, ear pain, fever, hemoptysis, nasal congestion, rhinorrhea or sore throat. Covid + on 12/12/2020    No fever Taking nyquil and dayquil prn  Patient Active Problem List   Diagnosis Date Noted  . S/P arthroscopy of left knee 11/21/2016  . Knee sprain 03/21/2015  . Adaptation reaction 12/26/2014  . Allergic rhinitis 12/26/2014  . Essential (primary) hypertension 12/26/2014  . Acid reflux 12/26/2014  . Avitaminosis D 12/26/2014   Past Medical History:  Diagnosis Date  . Anxiety   . Arthritis    right great toe  . Basal cell carcinoma 05/01/2020   Left anterior neck. Nodular. - excised 07/16/2020  . GERD (gastroesophageal reflux disease)    OCC  . Hypertension   . Motion sickness    long car rides, amusement park rides  . Seasonal allergies    Social History   Tobacco Use  . Smoking status: Never Smoker  . Smokeless tobacco: Never Used  Vaping Use  . Vaping Use: Never used  Substance Use Topics  . Alcohol use: Yes    Comment: 4 nights a week, wine or beer  . Drug use: No   No Known Allergies   Medications: Outpatient Medications Prior to Visit  Medication Sig  . amLODipine (NORVASC) 10 MG tablet TAKE 1 TABLET(10 MG) BY MOUTH DAILY  . Cholecalciferol (VITAMIN D) 2000 units tablet Take 1 tablet by mouth daily.  . fluticasone (FLONASE) 50 MCG/ACT nasal spray Place 1-2 sprays into both nostrils  daily.  . Glucosamine-Chondroitin-MSM 500-200-150 MG TABS Take 1 tablet by mouth daily.  Marland Kitchen ibuprofen (ADVIL,MOTRIN) 200 MG tablet Take 400 mg by mouth 3 (three) times daily as needed for headache or mild pain.   Marland Kitchen loratadine (CLARITIN) 10 MG tablet Take 10 mg by mouth daily.   . metoprolol succinate (TOPROL-XL) 50 MG 24 hr tablet Take 1 tablet (50 mg total) by mouth daily. Take with or immediately following a meal.  . montelukast (SINGULAIR) 10 MG tablet TAKE 1 TABLET(10 MG) BY MOUTH AT BEDTIME  . omeprazole (PRILOSEC) 10 MG capsule Take 1 capsule by mouth as needed.  . pyridoxine (B-6) 100 MG tablet Take 100 mg by mouth daily.  . sertraline (ZOLOFT) 50 MG tablet TAKE 1/2 TO 1 TABLET BY MOUTH EVERY DAY  . vitamin B-12 (CYANOCOBALAMIN) 1000 MCG tablet Take 1,000 mcg by mouth daily.  Marland Kitchen azelastine (ASTELIN) 0.1 % nasal spray SMARTSIG:2 Spray(s) Both Nares Every 12 Hours PRN  . mupirocin ointment (BACTROBAN) 2 % Apply 1 application topically 2 (two) times daily. Apply to aa QD-BID  . triamcinolone (KENALOG) 0.1 % Apply 1 application topically 2 (two) times daily as needed. Avoid applying to face, groin, and axilla. Use as directed. Risk of skin atrophy with long-term use reviewed.   No facility-administered medications prior to visit.    Review of Systems  Constitutional: Positive for fatigue. Negative for activity change, appetite change, chills, diaphoresis, fever and  unexpected weight change.  HENT: Positive for congestion, postnasal drip, sinus pressure and sinus pain. Negative for ear discharge, ear pain, rhinorrhea, sneezing, sore throat and tinnitus.   Eyes: Negative.   Respiratory: Positive for cough, shortness of breath and wheezing. Negative for apnea, hemoptysis, choking, chest tightness and stridor.   Cardiovascular: Negative.  Negative for chest pain and leg swelling.  Gastrointestinal: Positive for heartburn. Negative for abdominal pain, blood in stool, diarrhea, nausea and vomiting.   Genitourinary: Negative for dysuria, flank pain, frequency and urgency.  Neurological: Positive for headaches. Negative for dizziness and light-headedness.        Objective    BP 130/84 (BP Location: Left Arm, Patient Position: Sitting, Cuff Size: Large)   Pulse 80   Temp 98.4 F (36.9 C) (Oral)   Wt 213 lb (96.6 kg)   LMP 05/23/2018   BMI 32.39 kg/m    Physical Exam Vitals reviewed.  Constitutional:      General: She is not in acute distress.    Appearance: Normal appearance. She is well-developed. She is not diaphoretic.  HENT:     Head: Normocephalic and atraumatic.  Eyes:     General: No scleral icterus.    Conjunctiva/sclera: Conjunctivae normal.  Neck:     Thyroid: No thyromegaly.  Cardiovascular:     Rate and Rhythm: Normal rate and regular rhythm.     Pulses: Normal pulses.     Heart sounds: Normal heart sounds. No murmur heard.   Pulmonary:     Effort: Pulmonary effort is normal. No respiratory distress.     Breath sounds: Wheezing (bilateral) and rhonchi (bilateral) present. No rales.  Musculoskeletal:     Cervical back: Neck supple.     Right lower leg: No edema.     Left lower leg: No edema.  Lymphadenopathy:     Cervical: No cervical adenopathy.  Skin:    General: Skin is warm and dry.     Findings: No rash.  Neurological:     Mental Status: She is alert and oriented to person, place, and time. Mental status is at baseline.  Psychiatric:        Mood and Affect: Mood normal.        Behavior: Behavior normal.      No results found for any visits on 12/26/20.  Assessment & Plan     1. Bronchitis due to COVID-19 virus -New problem after recent COVID-19 infection - No fever, shortness of breath, focal lung sounds suggestive of pneumonia - No indication for chest x-ray at this time - No indication for antibiotics - Treat with prednisone and albuterol as needed - Continue OTC meds for symptom management as needed - Discussed return  precautions  Meds ordered this encounter  Medications  . predniSONE (DELTASONE) 10 MG tablet    Sig: Take 60mg  PO daily x1d, then 50mg  daily x1d, then 40mg  daily x1d, then 30mg  daily x1d, then 20mg  daily x1d, then 10mg  daily x1d, then stop    Dispense:  21 tablet    Refill:  0  . albuterol (VENTOLIN HFA) 108 (90 Base) MCG/ACT inhaler    Sig: Inhale 2 puffs into the lungs every 6 (six) hours as needed for wheezing or shortness of breath.    Dispense:  8 g    Refill:  0    Return if symptoms worsen or fail to improve.      Frederic Jericho Moorehead,acting as a Education administrator for Lavon Paganini, MD.,have documented all relevant documentation on the  behalf of Lavon Paganini, MD,as directed by  Lavon Paganini, MD while in the presence of Lavon Paganini, MD.  I, Lavon Paganini, MD, have reviewed all documentation for this visit. The documentation on 12/26/20 for the exam, diagnosis, procedures, and orders are all accurate and complete.   Aubrei Bouchie, Dionne Bucy, MD, MPH [facility]

## 2021-02-18 IMAGING — US SOFT TISSUE ULTRASOUND HEAD/NECK
1 series · 14 of 24 positions shown · non-contrast
Comparison: MRI 11/06/2018

CLINICAL DATA: RIGHT parotid gland mass suspected on recent MRI.

EXAM:
ULTRASOUND OF HEAD/NECK SOFT TISSUES
TECHNIQUE: Ultrasound examination of the head and neck soft tissues was
performed in the area of clinical concern.

[Series 1: soft tissue ultrasound head/neck · 0.05mm/px · 24 acquisitions, 14 frames shown]
[im 1/24]
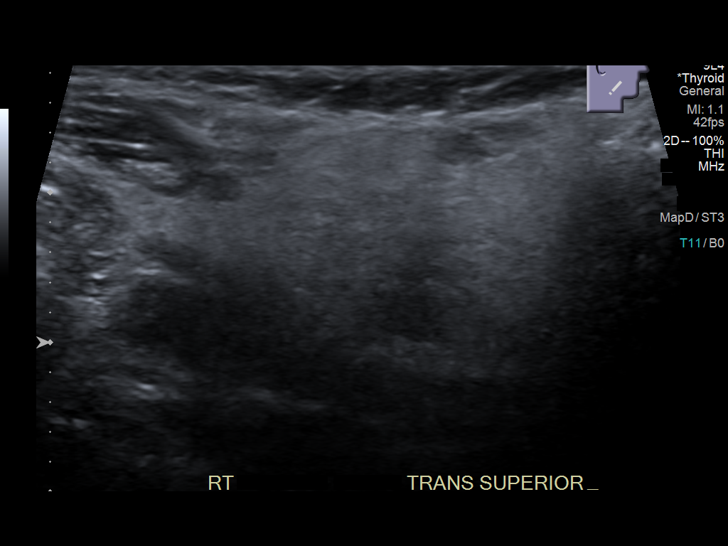
[im 3/24]
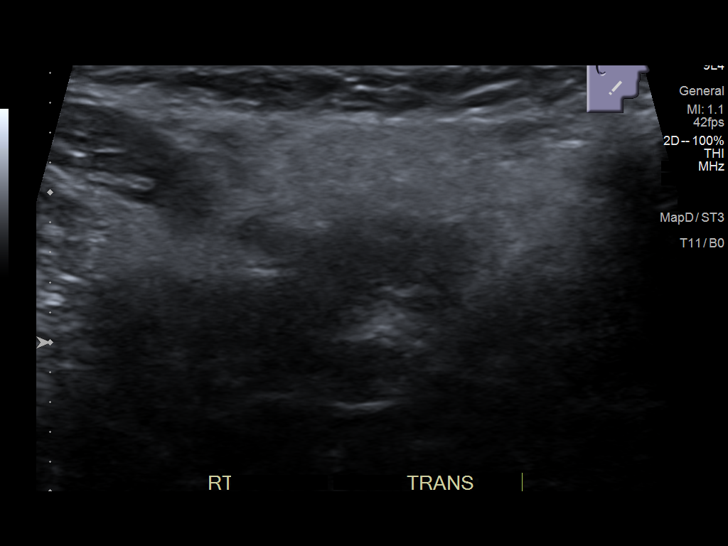
[im 5/24]
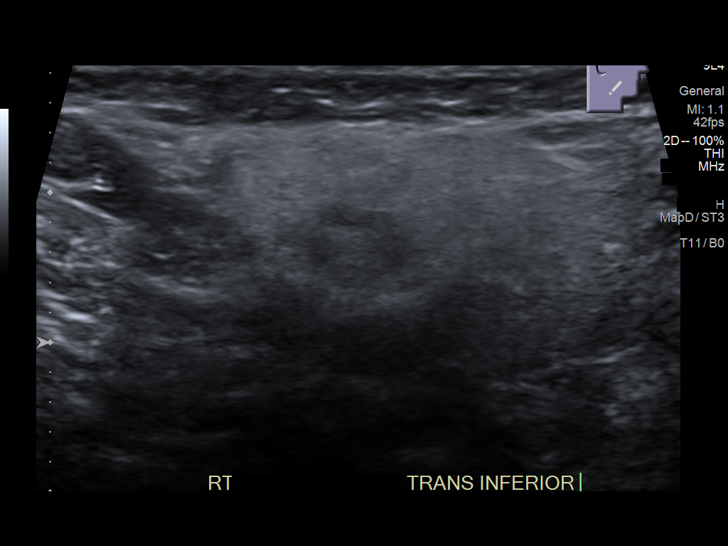
[im 7/24]
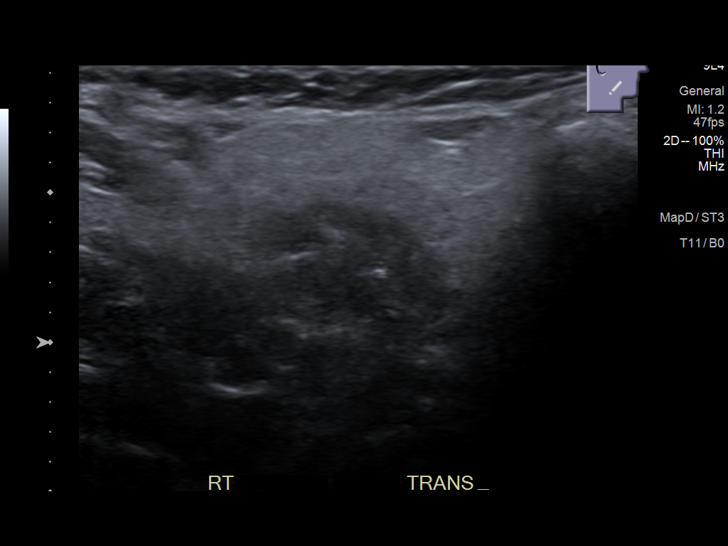
[im 8/24]
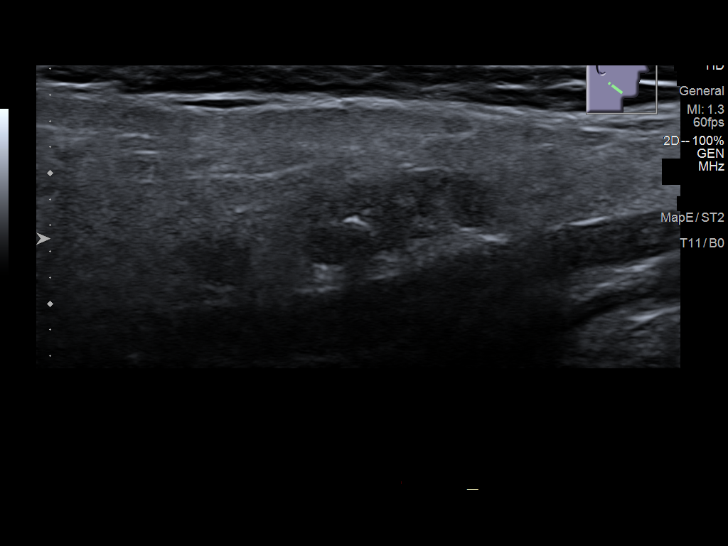
[im 10/24]
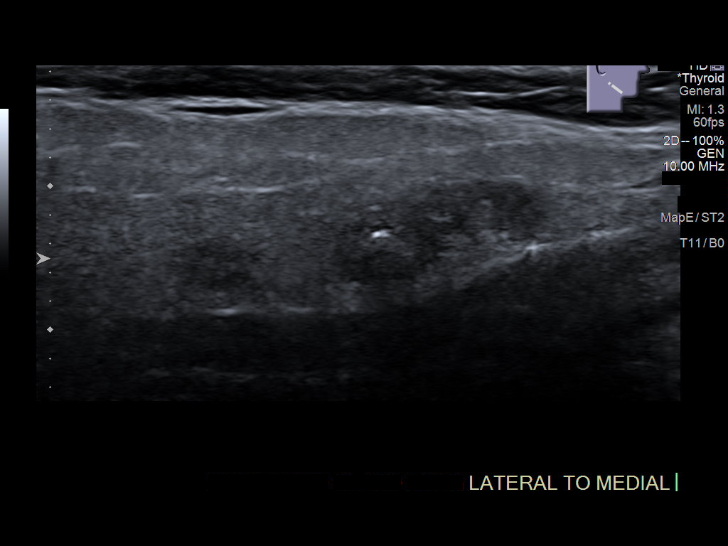
[im 12/24]
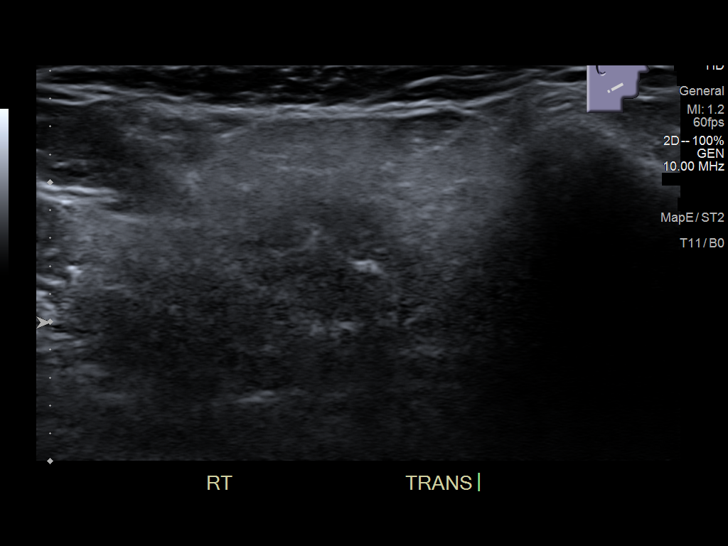
[im 13/24]
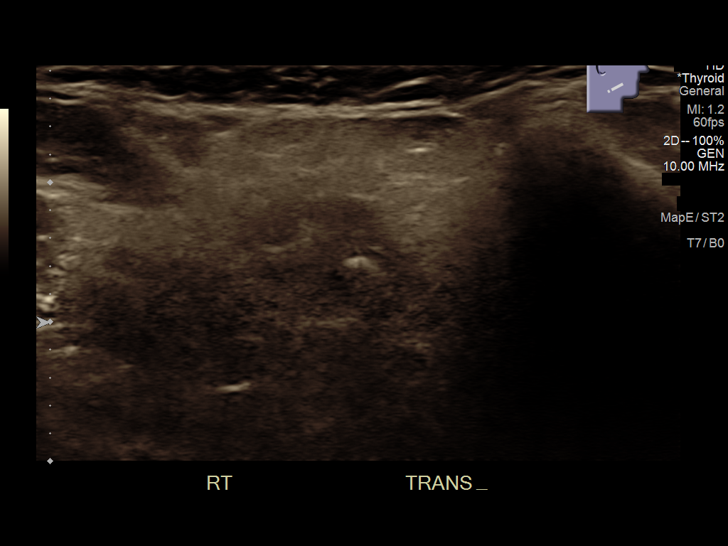
[im 15/24]
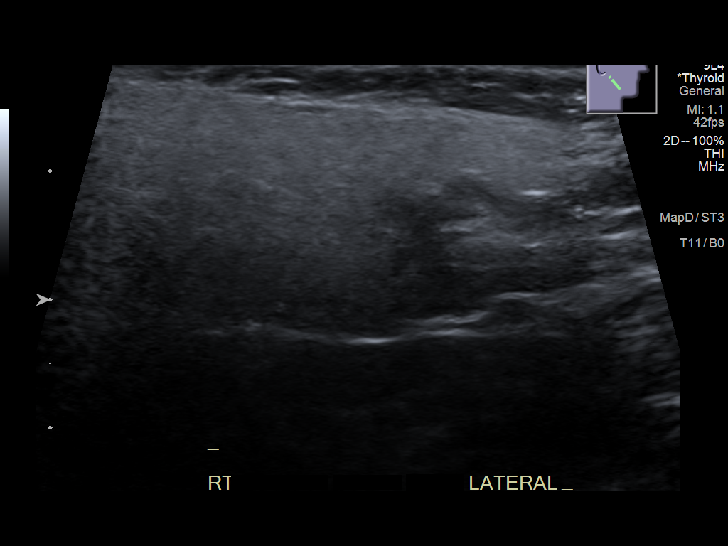
[im 17/24]
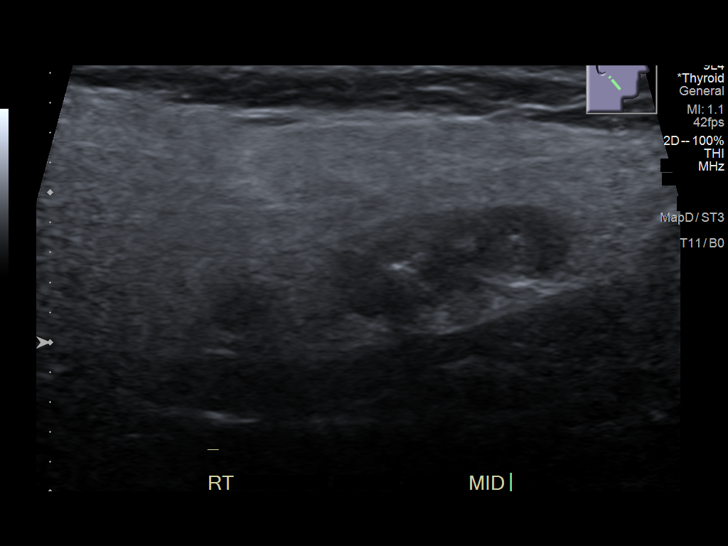
[im 19/24]
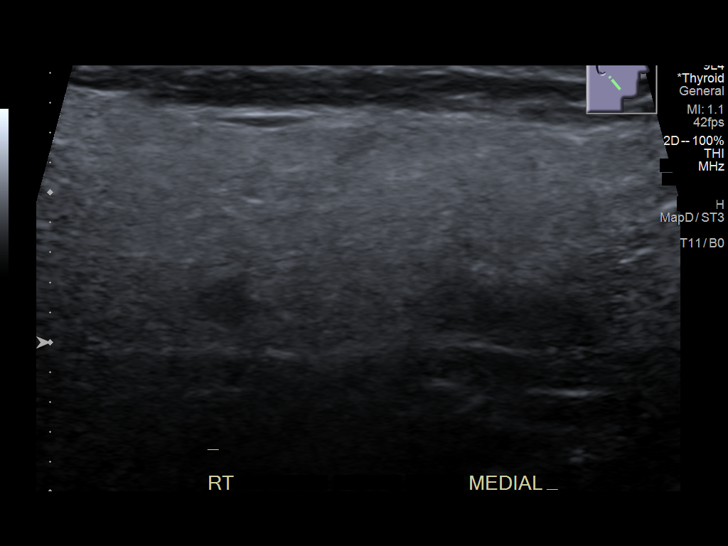
[im 20/24]
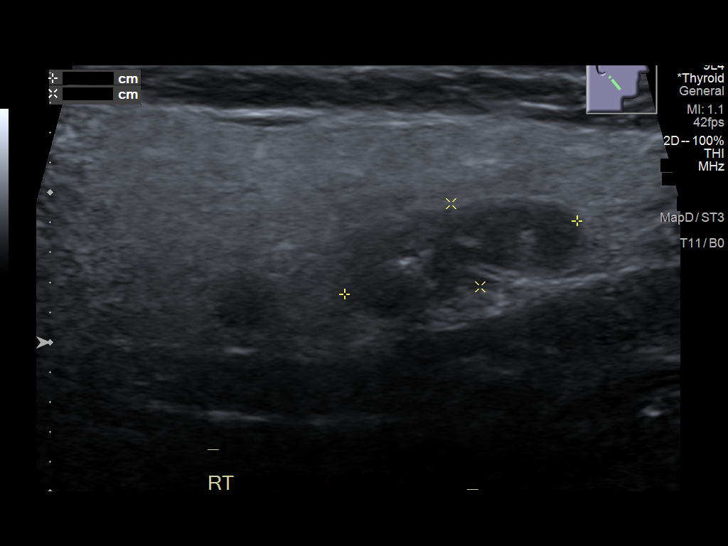
[im 22/24]
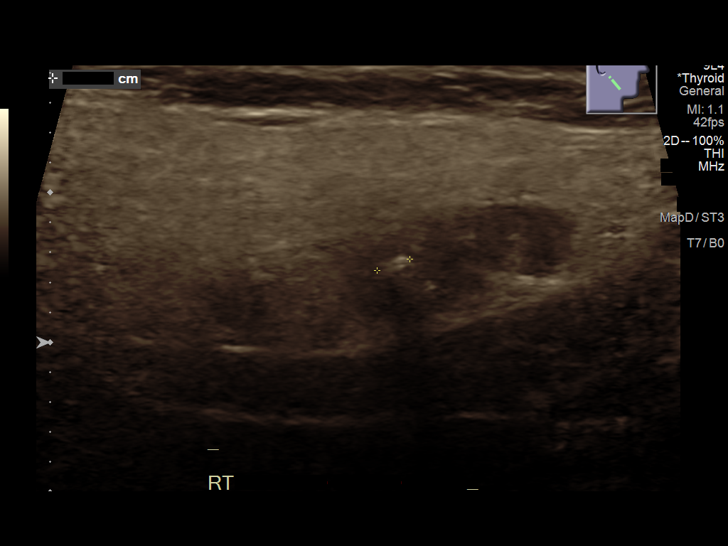
[im 24/24]
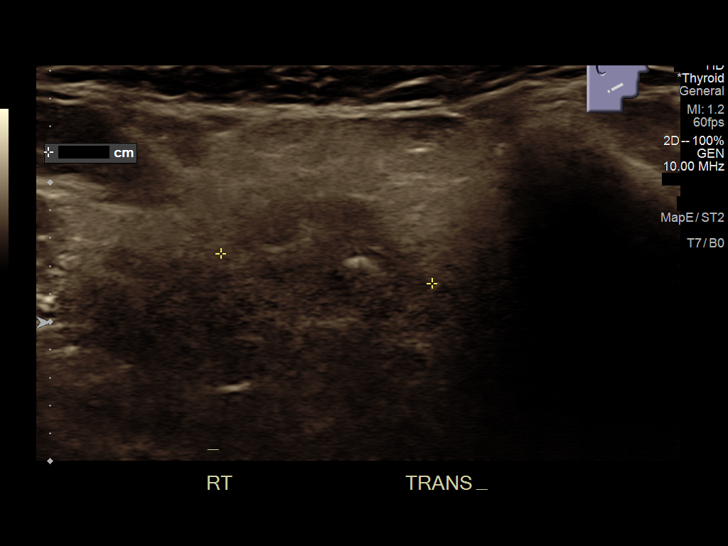

[14 of 24 positions shown; findings below may reference images not displayed]

FINDINGS: There is a hypoechoic mass, RIGHT inferior parotid gland, measuring
1.6 x 0.5 x 1.5 cm. Internal color flow noted. There is shadowing
suggesting calcifications.
IMPRESSION: RIGHT parotid mass is confirmed. 1.6 x 0.5 x 1.5 cm. Suspected
calcifications.

## 2021-05-07 ENCOUNTER — Other Ambulatory Visit: Payer: Self-pay

## 2021-05-07 ENCOUNTER — Ambulatory Visit: Payer: BC Managed Care – PPO | Admitting: Dermatology

## 2021-05-07 DIAGNOSIS — Z1283 Encounter for screening for malignant neoplasm of skin: Secondary | ICD-10-CM | POA: Diagnosis not present

## 2021-05-07 DIAGNOSIS — L578 Other skin changes due to chronic exposure to nonionizing radiation: Secondary | ICD-10-CM | POA: Diagnosis not present

## 2021-05-07 DIAGNOSIS — L820 Inflamed seborrheic keratosis: Secondary | ICD-10-CM | POA: Diagnosis not present

## 2021-05-07 DIAGNOSIS — Z85828 Personal history of other malignant neoplasm of skin: Secondary | ICD-10-CM | POA: Diagnosis not present

## 2021-05-07 DIAGNOSIS — D229 Melanocytic nevi, unspecified: Secondary | ICD-10-CM | POA: Diagnosis not present

## 2021-05-07 DIAGNOSIS — L821 Other seborrheic keratosis: Secondary | ICD-10-CM

## 2021-05-07 DIAGNOSIS — B081 Molluscum contagiosum: Secondary | ICD-10-CM | POA: Diagnosis not present

## 2021-05-07 DIAGNOSIS — L918 Other hypertrophic disorders of the skin: Secondary | ICD-10-CM

## 2021-05-07 DIAGNOSIS — D1800 Hemangioma unspecified site: Secondary | ICD-10-CM

## 2021-05-07 DIAGNOSIS — L814 Other melanin hyperpigmentation: Secondary | ICD-10-CM

## 2021-05-07 NOTE — Patient Instructions (Signed)

## 2021-05-07 NOTE — Progress Notes (Signed)
Follow-Up Visit   Subjective  Amanda Day is a 55 y.o. female who presents for the following: Other (Spot on back that is very itchy. Bumps on abdomen that popped up after doing yard work but have spread, they look like bites. TBSE today).   The following portions of the chart were reviewed this encounter and updated as appropriate:   Tobacco  Allergies  Meds  Problems  Med Hx  Surg Hx  Fam Hx      Review of Systems:  No other skin or systemic complaints except as noted in HPI or Assessment and Plan.  Objective  Well appearing patient in no apparent distress; mood and affect are within normal limits.  A full examination was performed including scalp, head, eyes, ears, nose, lips, neck, chest, axillae, abdomen, back, buttocks, bilateral upper extremities, bilateral lower extremities, hands, feet, fingers, toes, fingernails, and toenails. All findings within normal limits unless otherwise noted below.  abdomen, thighs (12) Smooth, pink/flesh dome-shaped papules with central umbilication - Discussed viral etiology and contagion.   Right Upper Back x 1 Erythematous keratotic or waxy stuck-on papule or plaque.    Assessment & Plan  Molluscum contagiosum abdomen, thighs  Molluscum are small wart-like bumps caused by a viral infection in the skin and can easily spread.  More commonly seen in children who have eczema, because of dry inflamed skin and frequent scratching.  Use your prescription topical eczema medication as directed if prescribed.  Recommend routine use of mild soap and moisturizing cream to prevent spread.  Do not share towels.  Multiple treatments may be required to clear molluscum.  14 at the R inner thigh and 12 at the L inner thigh - treated abdomen x 4, L thigh x 3, R thigh x 5    Destruction of lesion - abdomen, thighs Complexity: simple   Destruction method: chemical removal   Informed consent: discussed and consent obtained   Chemical destruction  method: cantharidin   Application time:  4 hours Outcome: patient tolerated procedure well with no complications   Post-procedure details: wound care instructions given    Inflamed seborrheic keratosis Right Upper Back x 1  Prior to procedure, discussed risks of blister formation, small wound, skin dyspigmentation, or rare scar following cryotherapy. Recommend Vaseline ointment to treated areas while healing.   Destruction of lesion - Right Upper Back x 1 Complexity: simple   Destruction method: cryotherapy   Informed consent: discussed and consent obtained   Timeout:  patient name, date of birth, surgical site, and procedure verified Lesion destroyed using liquid nitrogen: Yes   Region frozen until ice ball extended beyond lesion: Yes   Outcome: patient tolerated procedure well with no complications   Post-procedure details: wound care instructions given    Lentigines - Scattered tan macules - Due to sun exposure - Benign-appearing, observe - Recommend daily broad spectrum sunscreen SPF 30+ to sun-exposed areas, reapply every 2 hours as needed. - Call for any changes  Seborrheic Keratoses - Stuck-on, waxy, tan-brown papules and/or plaques  - Benign-appearing - Discussed benign etiology and prognosis. - Observe - Call for any changes  Melanocytic Nevi - Tan-brown and/or pink-flesh-colored symmetric macules and papules - Benign appearing on exam today - Observation - Call clinic for new or changing moles - Recommend daily use of broad spectrum spf 30+ sunscreen to sun-exposed areas.   Hemangiomas - Red papules - Discussed benign nature - Observe - Call for any changes  Actinic Damage - chronic, secondary  to cumulative UV radiation exposure/sun exposure over time - diffuse scaly erythematous macules with underlying dyspigmentation - Recommend daily broad spectrum sunscreen SPF 30+ to sun-exposed areas, reapply every 2 hours as needed.  - Recommend staying in the  shade or wearing long sleeves, sun glasses (UVA+UVB protection) and wide brim hats (4-inch brim around the entire circumference of the hat). - Call for new or changing lesions.   History of Basal Cell Carcinoma of the Skin - L ant neck  - No evidence of recurrence today - Recommend regular full body skin exams - Recommend daily broad spectrum sunscreen SPF 30+ to sun-exposed areas, reapply every 2 hours as needed.  - Call if any new or changing lesions are noted between office visits  Acrochordons (Skin Tags) - Fleshy, skin-colored pedunculated papules - Benign appearing.  - Observe. - If desired, they can be removed with an in office procedure that is not covered by insurance. - Please call the clinic if you notice any new or changing lesions.  Skin cancer screening performed today.  Return in about 1 year (around 05/07/2022) for TBSE; 4-6 weeks molluscum follow up .  Luther Redo, CMA, am acting as scribe for Forest Gleason, MD .  Documentation: I have reviewed the above documentation for accuracy and completeness, and I agree with the above.  Forest Gleason, MD

## 2021-05-18 ENCOUNTER — Encounter: Payer: Self-pay | Admitting: Dermatology

## 2021-06-16 ENCOUNTER — Encounter: Payer: Self-pay | Admitting: Dermatology

## 2021-06-16 ENCOUNTER — Other Ambulatory Visit: Payer: Self-pay

## 2021-06-16 ENCOUNTER — Ambulatory Visit: Payer: BC Managed Care – PPO | Admitting: Dermatology

## 2021-06-16 DIAGNOSIS — B081 Molluscum contagiosum: Secondary | ICD-10-CM

## 2021-06-16 NOTE — Progress Notes (Signed)
   Follow-Up Visit   Subjective  Amanda Day is a 55 y.o. female who presents for the following: Molluscum Contagiosum (Patient here for 6 week follow up at abdomen and thighs. Patient treated with cantharidin at last visit and advises the areas have cleared. ).  The following portions of the chart were reviewed this encounter and updated as appropriate:   Tobacco  Allergies  Meds  Problems  Med Hx  Surg Hx  Fam Hx      Review of Systems:  No other skin or systemic complaints except as noted in HPI or Assessment and Plan.  Objective  Well appearing patient in no apparent distress; mood and affect are within normal limits.  A focused examination was performed including abdomen, thighs. Relevant physical exam findings are noted in the Assessment and Plan.  thighs, abdomen > 20, buttocks (20) Smooth, pink/flesh dome-shaped papules with central umbilication - Discussed viral etiology and contagion.    Assessment & Plan  Molluscum contagiosum thighs, abdomen > 20, buttocks  Cantharidin Plus is a blistering agent that comes from a beetle.  It needs to be washed off in about 4 hours after application.  Although it is painless when applied in office, it may cause symptoms of mild pain and burning several hours later.  Treated areas will swell and turn red, and blisters may form.  Vaseline and a bandaid may be applied until wound has healed.  Once healed, the skin may remain temporarily discolored.  It can take weeks to months for pigmentation to return to normal.  Advised to wash off with soap and water in 4 hours or sooner if it becomes tender before then.   Destruction of lesion - thighs, abdomen > 20, buttocks  Destruction method: chemical removal   Informed consent: discussed and consent obtained   Timeout:  patient name, date of birth, surgical site, and procedure verified Chemical destruction method: cantharidin   Chemical destruction method comment:  Cantharidin  plus Application time:  4 hours Procedure instructions: patient instructed to wash and dry area   Outcome: patient tolerated procedure well with no complications   Post-procedure details: wound care instructions given    Return in about 6 weeks (around 07/28/2021) for Molluscum.  Graciella Belton, RMA, am acting as scribe for Forest Gleason, MD .  Documentation: I have reviewed the above documentation for accuracy and completeness, and I agree with the above.  Forest Gleason, MD

## 2021-06-16 NOTE — Patient Instructions (Signed)
Instructions for After In-Office Application of Cantharidin  1. This is a strong medicine; please follow ALL instructions.  2. Gently wash off with soap and water in four hours or sooner s directed by your physician.  3. **WARNING** this medicine can cause severe blistering, blood blisters, infection, and/or scarring if it is not washed off as directed.  4. Your progress will be rechecked in 1-2 months; call sooner if there are any questions or problems.  Cantharidin Plus is a blistering agent that comes from a beetle.  It needs to be washed off in about 4 hours after application.  Although it is painless when applied in office, it may cause symptoms of mild pain and burning several hours later.  Treated areas will swell and turn red, and blisters may form.  Vaseline and a bandaid may be applied until wound has healed.  Once healed, the skin may remain temporarily discolored.  It can take weeks to months for pigmentation to return to normal.  Advised to wash off with soap and water in 4 hours or sooner if it becomes tender before then.  If you have any questions or concerns for your doctor, please call our main line at [phone] and press option 4 to reach your doctor's medical assistant. If no one answers, please leave a voicemail as directed and we will return your call as soon as possible. Messages left after 4 pm will be answered the following business day.   You may also send [place] a message via [place]. We typically respond to MyChart messages within 1-2 business days.  For prescription refills, please ask your pharmacy to contact our office. Our fax number is [phone].  If you have an urgent issue when the clinic is closed that cannot wait until the next business day, you can page your doctor at the number below.    Please note that while we do our best to be available for urgent issues outside of office hours, we are not available 24/7.   If you have an urgent issue and are unable  to reach [place], you may choose to seek medical care at your doctor's office, retail clinic, urgent care center, or emergency room.  If you have a medical emergency, please immediately call 911 or go to the emergency department.  Pager Numbers  - Dr. Nehemiah Massed: [phone]  - Dr. Laurence Ferrari: [phone]  - Dr. Nicole Kindred: [phone]  In the event of inclement weather, please call our main line at [phone] for an update on the status of any delays or closures.  Dermatology Medication Tips: Please keep the boxes that topical medications come in in order to help keep track of the instructions about where and how to use these. Pharmacies typically print the medication instructions only on the boxes and not directly on the medication tubes.   If your medication is too expensive, please contact our office at [phone] option 4 or send [place] a message through [place].   We are unable to tell what your co-pay for medications will be in advance as this is different depending on your insurance coverage. However, we may be able to find a substitute medication at lower cost or fill out paperwork to get insurance to cover a needed medication.   If a prior authorization is required to get your medication covered by your insurance company, please allow [place] 1-2 business days to complete this process.  Drug prices often vary depending on where the prescription is filled and some pharmacies may offer  cheaper prices.  The website www.goodrx.com contains coupons for medications through different pharmacies. The prices here do not account for what the cost may be with help from insurance (it may be cheaper with your insurance), but the website can give you the price if you did not use any insurance.  - You can print the associated coupon and take it with your prescription to the pharmacy.  - You may also stop by our office during regular business hours and pick up a GoodRx coupon card.  - If you need your  prescription sent electronically to a different pharmacy, notify our office through [facility] or by phone at [phone] option 4.

## 2021-06-22 ENCOUNTER — Other Ambulatory Visit: Payer: Self-pay | Admitting: Physician Assistant

## 2021-06-22 DIAGNOSIS — I10 Essential (primary) hypertension: Secondary | ICD-10-CM

## 2021-06-22 DIAGNOSIS — J301 Allergic rhinitis due to pollen: Secondary | ICD-10-CM

## 2021-07-01 ENCOUNTER — Other Ambulatory Visit: Payer: Self-pay | Admitting: Family Medicine

## 2021-07-01 DIAGNOSIS — J301 Allergic rhinitis due to pollen: Secondary | ICD-10-CM

## 2021-07-01 DIAGNOSIS — I10 Essential (primary) hypertension: Secondary | ICD-10-CM

## 2021-07-01 MED ORDER — FLUTICASONE PROPIONATE 50 MCG/ACT NA SUSP: 1.0000 | Freq: Every day | NASAL | 0 refills | Status: DC

## 2021-07-01 NOTE — Telephone Encounter (Signed)
Medication Refill - Medication:  metoprolol succinate (TOPROL-XL) 50 MG 24 hr tablet  fluticasone (FLONASE) 50 MCG/ACT nasal spray  Has the patient contacted their pharmacy? Yes.   Medication was denied, contact pcp-appt  Preferred Pharmacy (with phone number or street name):  [id number] [address], [place] Phone:  [phone]  Fax:  [phone]      Has the patient been seen for an appointment in the last year OR does the patient have an upcoming appointment? Yes.    *Pt does have appt on 12/12 and requested to get a refill to hold her until that appt, she is completely out*   Agent: Please be advised that RX refills may take up to 3 business days. We ask that you follow-up with your pharmacy.

## 2021-07-01 NOTE — Telephone Encounter (Signed)
Requested Prescriptions  Pending Prescriptions Disp Refills  . metoprolol succinate (TOPROL-XL) 50 MG 24 hr tablet 90 tablet 0    Sig: Take 1 tablet (50 mg total) by mouth daily. Please schedule office visit before any future refills.     Cardiovascular:  Beta Blockers Failed - 07/01/2021  4:08 PM      Failed - Valid encounter within last 6 months    Recent Outpatient Visits          6 months ago Bronchitis due to COVID-19 virus   [facility], Dionne Bucy, [place]   12 months ago Salivary gland enlargement   [facility], Clearnce Sorrel, [place]   1 year ago Annual physical exam   [facility] [place], Clearnce Sorrel, [place]   1 year ago Hordeolum externum of left upper eyelid   [facility], Dionne Bucy, [place]   2 years ago Annual physical exam   [place], Clearnce Sorrel, [place]      Future Appointments            In 4 weeks Laurence Ferrari, [place], MD [place]   In 1 month Gwyneth Sprout, [place], [facility]           Passed - Last BP in normal range    BP Readings from Last 1 Encounters:  12/26/20 130/84         Passed - Last Heart Rate in normal range    Pulse Readings from Last 1 Encounters:  12/26/20 80         . fluticasone (FLONASE) 50 MCG/ACT nasal spray 48 g 3    Sig: Place 1-2 sprays into both nostrils daily.     Ear, Nose, and Throat: Nasal Preparations - Corticosteroids Passed - 07/01/2021  4:08 PM      Passed - Valid encounter within last 12 months    Recent Outpatient Visits          6 months ago Bronchitis due to COVID-19 virus   [facility], Dionne Bucy, [place]   12 months ago Salivary gland enlargement   [place], Clearnce Sorrel, [place]   1 year ago Annual physical exam   [place], Clearnce Sorrel, [place]   1 year ago Hordeolum externum of left upper eyelid   [facility]  [place], Dionne Bucy, MD   2 years ago Annual physical exam   [place], [place]      Future Appointments            In 4 weeks Laurence Ferrari, [place], [place]   In 1 month Rollene Rotunda, Jaci Standard, [place], [place]

## 2021-07-02 NOTE — Telephone Encounter (Signed)
Call to pharmacy- Rx is on file for patient.  Requested Prescriptions  Pending Prescriptions Disp Refills  . metoprolol succinate (TOPROL-XL) 50 MG 24 hr tablet 90 tablet 0    Sig: Take 1 tablet (50 mg total) by mouth daily. Please schedule office visit before any future refills.     Cardiovascular:  Beta Blockers Failed - 07/01/2021  4:08 PM      Failed - Valid encounter within last 6 months    Recent Outpatient Visits          6 months ago Bronchitis due to COVID-19 virus   [facility], Dionne Bucy, [place]   1 year ago Salivary gland enlargement   [place], Clearnce Sorrel, [place]   1 year ago Annual physical exam   [facility], Clearnce Sorrel, [place]   1 year ago Hordeolum externum of left upper eyelid   [facility], Dionne Bucy, [place]   2 years ago Annual physical exam   [place], Clearnce Sorrel, [place]      Future Appointments            In 3 weeks Laurence Ferrari, [place], MD [place]   In 1 month Gwyneth Sprout, [place], [facility]           Passed - Last BP in normal range    BP Readings from Last 1 Encounters:  12/26/20 130/84         Passed - Last Heart Rate in normal range    Pulse Readings from Last 1 Encounters:  12/26/20 80         Signed Prescriptions Disp Refills   fluticasone (FLONASE) 50 MCG/ACT nasal spray 48 g 0    Sig: Place 1-2 sprays into both nostrils daily.     Ear, Nose, and Throat: Nasal Preparations - Corticosteroids Passed - 07/01/2021  4:08 PM      Passed - Valid encounter within last 12 months    Recent Outpatient Visits          6 months ago Bronchitis due to COVID-19 virus   [facility], Dionne Bucy, [place]   1 year ago Salivary gland enlargement   [place], Clearnce Sorrel, [place]   1 year ago Annual physical exam   [place], Clearnce Sorrel, [place]   1  year ago Hordeolum externum of left upper eyelid   [facility] [place], Dionne Bucy, [place]   2 years ago Annual physical exam   [place], Clearnce Sorrel, [place]      Future Appointments            In 3 weeks Laurence Ferrari, [place], MD [place]   In 1 month Rollene Rotunda, Jaci Standard, [place], [place]

## 2021-07-29 ENCOUNTER — Other Ambulatory Visit: Payer: Self-pay

## 2021-07-29 ENCOUNTER — Ambulatory Visit: Payer: BC Managed Care – PPO | Admitting: Dermatology

## 2021-07-29 ENCOUNTER — Encounter: Payer: Self-pay | Admitting: Dermatology

## 2021-07-29 DIAGNOSIS — B081 Molluscum contagiosum: Secondary | ICD-10-CM

## 2021-07-29 NOTE — Progress Notes (Signed)
   Follow-Up Visit   Subjective  Amanda Day is a 55 y.o. female who presents for the following: Follow-up (Recheck [facility]. Abdomen, groin, thighs, buttocks. Blistered from cantharone plus after last visit).  The following portions of the chart were reviewed this encounter and updated as appropriate:  Tobacco  Allergies  Meds  Problems  Med Hx  Surg Hx  Fam Hx      Review of Systems: No other skin or systemic complaints except as noted in HPI or Assessment and Plan.   Objective  Well appearing patient in no apparent distress; mood and affect are within normal limits.  A focused examination was performed including groin, buttocks, abdomen, thighs. Relevant physical exam findings are noted in the Assessment and Plan.  medial thighs, buttocks, gluteal crease x22 (22) Smooth, pink/flesh dome-shaped papules with central umbilication - Discussed viral etiology and contagion.   Assessment & Plan  Molluscum contagiosum medial thighs, buttocks, gluteal crease x22  Molluscum are small wart-like bumps caused by a viral infection in the skin and can easily spread.  More commonly seen in children who have eczema, because of dry inflamed skin and frequent scratching.  Use your prescription topical eczema medication as directed if prescribed.  Recommend routine use of mild soap and moisturizing cream to prevent spread.  Do not share towels.  Multiple treatments may be required to clear molluscum.  Cantharidin Plus is a blistering agent that comes from a beetle.  It needs to be washed off in about 4 hours after application.  Although it is painless when applied in office, it may cause symptoms of mild pain and burning several hours later.  Treated areas will swell and turn red, and blisters may form.  Vaseline and a bandaid may be applied until wound has healed.  Once healed, the skin may remain temporarily discolored.  It can take weeks to months for pigmentation to return to normal.  Advised to  wash off with soap and water in 4 hours or sooner if it becomes tender before then.   Destruction of lesion - medial thighs, buttocks, gluteal crease x22  Destruction method: chemical removal   Informed consent: discussed and consent obtained   Timeout:  patient name, date of birth, surgical site, and procedure verified Chemical destruction method: cantharidin   Application time:  4 hours Procedure instructions: patient instructed to wash and dry area   Outcome: patient tolerated procedure well with no complications   Post-procedure details: wound care instructions given    Return [place] recheck 4 weeks.  I, Emelia Salisbury, CMA, am acting as scribe for Forest Gleason, MD.  Documentation: I have reviewed the above documentation for accuracy and completeness, and I agree with the above.  Forest Gleason, MD

## 2021-07-29 NOTE — Patient Instructions (Signed)
Cantharidin Plus is a blistering agent that comes from a beetle.  It needs to be washed off in about 4 hours after application.  Although it is painless when applied in office, it may cause symptoms of mild pain and burning several hours later.  Treated areas will swell and turn red, and blisters may form.  Vaseline and a bandaid may be applied until wound has healed.  Once healed, the skin may remain temporarily discolored.  It can take weeks to months for pigmentation to return to normal.  Advised to wash off with soap and water in 4 hours or sooner if it becomes tender before then.   If You Need Anything After Your Visit  If you have any questions or concerns for your doctor, please call our main line at [phone] and press option 4 to reach your doctor's medical assistant. If no one answers, please leave a voicemail as directed and we will return your call as soon as possible. Messages left after 4 pm will be answered the following business day.   You may also send [place] a message via [place]. We typically respond to MyChart messages within 1-2 business days.  For prescription refills, please ask your pharmacy to contact our office. Our fax number is [phone].  If you have an urgent issue when the clinic is closed that cannot wait until the next business day, you can page your doctor at the number below.    Please note that while we do our best to be available for urgent issues outside of office hours, we are not available 24/7.   If you have an urgent issue and are unable to reach [place], you may choose to seek medical care at your doctor's office, retail clinic, urgent care center, or emergency room.  If you have a medical emergency, please immediately call 911 or go to the emergency department.  Pager Numbers  - Dr. Nehemiah Massed: [phone]  - Dr. Laurence Ferrari: [phone]  - Dr. Nicole Kindred: [phone]  In the event of inclement weather, please call our main line at [phone] for an update  on the status of any delays or closures.  Dermatology Medication Tips: Please keep the boxes that topical medications come in in order to help keep track of the instructions about where and how to use these. Pharmacies typically print the medication instructions only on the boxes and not directly on the medication tubes.   If your medication is too expensive, please contact our office at [phone] option 4 or send [place] a message through [place].   We are unable to tell what your co-pay for medications will be in advance as this is different depending on your insurance coverage. However, we may be able to find a substitute medication at lower cost or fill out paperwork to get insurance to cover a needed medication.   If a prior authorization is required to get your medication covered by your insurance company, please allow [place] 1-2 business days to complete this process.  Drug prices often vary depending on where the prescription is filled and some pharmacies may offer cheaper prices.  The website www.goodrx.com contains coupons for medications through different pharmacies. The prices here do not account for what the cost may be with help from insurance (it may be cheaper with your insurance), but the website can give you the price if you did not use any insurance.  - You can print the associated coupon and take it with your prescription to the pharmacy.  -  You may also stop by our office during regular business hours and pick up a GoodRx coupon card.  - If you need your prescription sent electronically to a different pharmacy, notify our office through [facility] or by phone at [phone] option 4.

## 2021-07-31 NOTE — Progress Notes (Signed)
Complete physical exam   Patient: Amanda Day   DOB: 1966/05/02   55 y.o. Female  MRN: [id number] Visit Date: 08/03/2021  Today's healthcare provider: Gwyneth Sprout, FNP   Chief Complaint  Patient presents with   Annual Exam   Subjective    Amanda Day is a 55 y.o. female who presents today for a complete physical exam.  She reports consuming a general diet. The patient does not participate in regular exercise at present. She generally feels fairly well. She reports sleeping well. She does not have additional problems to discuss today.  HPI  -Pap Smear 06/21/21 -Mammogram over due since 10/28/20 -Tdap over due sine 03/14/21 would like to postpone -Currently seeing physical therapist in [place] for knees and hands (trigger finger). Wears braces at night due to hands going numb. Knee pain affects ability to work out possibly due to weight gain. Also noticed she has a lot of stomach issues and a lot acid reflux where chest burns and lots of burping. Started trying to cut out garlic and onion due to seeing a difference. Uses TUMs or Prilosec if really bad but not more than once a week.  -Seeing dermatologist for skin rash on inner thighs and lower butt (moluscum) and is using cantharidin. Past Medical History:  Diagnosis Date   Anxiety    Arthritis    right great toe   Basal cell carcinoma 05/01/2020   Left anterior neck. Nodular. - excised 07/16/2020   GERD (gastroesophageal reflux disease)    OCC   Hypertension    Motion sickness    long car rides, amusement park rides   Seasonal allergies    Past Surgical History:  Procedure Laterality Date   COLONOSCOPY N/A 02/21/2015   Procedure: COLONOSCOPY;  Surgeon: Lucilla Lame, MD;  Location: [place];  Service: Gastroenterology;  Laterality: N/A;   KNEE ARTHROSCOPY Left 10/04/2016   Procedure: ARTHROSCOPY KNEE;  Surgeon: Dereck Leep, MD;  Location: [facility];  Service: Orthopedics;  Laterality: Left;   KNEE  ARTHROSCOPY Right 04/06/2017   Procedure: ARTHROSCOPY KNEE, PARTIAL MEDIAL MENISCECTOMY, MEDIAL CHONDROPLASTY;  Surgeon: Dereck Leep, MD;  Location: [facility];  Service: Orthopedics;  Laterality: Right;   laser vaporization of VIN  01/09/2008   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 and Dr Hazle Nordmann   TUBAL LIGATION  2009   Social History   Socioeconomic History   Marital status: Divorced    Spouse name: Not on file   Number of children: 2   Years of education: 18   Highest education level: Not on file  Occupational History   Occupation: Investment banker, 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  Tobacco Use   Smoking status: Never   Smokeless tobacco: Never  Vaping Use   Vaping Use: Never used  Substance and Sexual Activity   Alcohol use: Yes    Comment: 4 nights a week, wine or beer   Drug use: No   Sexual activity: Not Currently    Birth control/protection: Surgical  Other Topics Concern   Not on file  Social History Narrative   Not on file   Social Determinants of Health   Financial Resource Strain: Not on file  Food Insecurity: Not on file  Transportation Needs: Not on file  Physical Activity: Not on file  Stress: Not on file  Social Connections: Not on file  Intimate Partner Violence: Not on file   Family Status  Relation Name Status   Mother  Alive  Father  Deceased at age 39   Sister  46   Brother  Alive   Family History  Problem Relation Age of Onset   Hypertension Mother    Anxiety disorder Mother    Mitral valve prolapse Mother    Cancer Father        Lung and Kidney   Hypertension Father    Arrhythmia Father    Hypertension Sister    Hypertension Brother    No Known Allergies  Patient Care Team: Mar Daring, [place] as PCP - General (Family Medicine)   Medications: Outpatient Medications Prior to Visit  Medication Sig   albuterol (VENTOLIN HFA) 108 (90 Base) MCG/ACT inhaler Inhale 2 puffs into the lungs every 6 (six) hours as needed for wheezing or shortness of  breath.   amLODipine (NORVASC) 10 MG tablet Take 1 tablet (10 mg total) by mouth daily. Please schedule office visit before any future refills.   Cholecalciferol (VITAMIN D) 2000 units tablet Take 1 tablet by mouth daily.   fluticasone (FLONASE) 50 MCG/ACT nasal spray Place 1-2 sprays into both nostrils daily.   Glucosamine-Chondroitin-MSM 500-200-150 MG TABS Take 1 tablet by mouth daily.   ibuprofen (ADVIL,MOTRIN) 200 MG tablet Take 400 mg by mouth 3 (three) times daily as needed for headache or mild pain.    loratadine (CLARITIN) 10 MG tablet Take 10 mg by mouth daily as needed.   metoprolol succinate (TOPROL-XL) 50 MG 24 hr tablet Take 1 tablet (50 mg total) by mouth daily. Please schedule office visit before any future refills.   montelukast (SINGULAIR) 10 MG tablet Take 1 tablet (10 mg total) by mouth at bedtime. Please schedule office visit before any future refills.   omeprazole (PRILOSEC) 10 MG capsule Take 1 capsule by mouth as needed.   pyridoxine (B-6) 100 MG tablet Take 100 mg by mouth daily.   sertraline (ZOLOFT) 50 MG tablet TAKE 1/2 TO 1 TABLET BY MOUTH EVERY DAY   vitamin B-12 (CYANOCOBALAMIN) 1000 MCG tablet Take 1,000 mcg by mouth daily.   [DISCONTINUED] predniSONE (DELTASONE) 10 MG tablet Take 60mg  PO daily x1d, then 50mg  daily x1d, then 40mg  daily x1d, then 30mg  daily x1d, then 20mg  daily x1d, then 10mg  daily x1d, then stop (Patient not taking: Reported on 08/03/2021)   No facility-administered medications prior to visit.    Review of Systems  HENT:  Positive for tinnitus.   Cardiovascular:  Positive for leg swelling.  Skin:  Positive for rash.  Allergic/Immunologic: Positive for environmental allergies.  All other systems reviewed and are negative.    Objective    BP 133/81   Pulse 72   Temp 97.8 F (36.6 C) (Oral)   Ht 5\' 8"  (1.727 m)   Wt 226 lb 4.8 oz (102.6 kg)   LMP 05/23/2018   SpO2 100%   BMI 34.41 kg/m    Physical Exam Vitals and nursing note  reviewed.  Constitutional:      General: She is awake. She is not in acute distress.    Appearance: Normal appearance. She is well-developed and well-groomed. She is obese. She is not ill-appearing, toxic-appearing or diaphoretic.  HENT:     Head: Normocephalic and atraumatic.     Jaw: There is normal jaw occlusion. No trismus, tenderness, swelling or pain on movement.     Right Ear: Hearing, tympanic membrane, ear canal and external ear normal. There is no impacted cerumen.     Left Ear: Hearing, tympanic membrane, ear canal and external ear  normal. There is no impacted cerumen.     Nose: Nose normal. No congestion or rhinorrhea.     Right Turbinates: Not enlarged, swollen or pale.     Left Turbinates: Not enlarged, swollen or pale.     Right Sinus: No maxillary sinus tenderness or frontal sinus tenderness.     Left Sinus: No maxillary sinus tenderness or frontal sinus tenderness.     Mouth/Throat:     Lips: Pink.     Mouth: Mucous membranes are moist. No injury.     Tongue: No lesions.     Pharynx: Oropharynx is clear. Uvula midline. No pharyngeal swelling, oropharyngeal exudate, posterior oropharyngeal erythema or uvula swelling.     Tonsils: No tonsillar exudate or tonsillar abscesses.  Eyes:     General: Lids are normal. Lids are everted, no foreign bodies appreciated. Vision grossly intact. Gaze aligned appropriately. No allergic shiner or visual field deficit.       Right eye: No discharge.        Left eye: No discharge.     Extraocular Movements: Extraocular movements intact.     Conjunctiva/sclera: Conjunctivae normal.     Right eye: Right conjunctiva is not injected. No exudate.    Left eye: Left conjunctiva is not injected. No exudate.    Pupils: Pupils are equal, round, and reactive to light.  Neck:     Thyroid: No thyroid mass, thyromegaly or thyroid tenderness.     Vascular: No carotid bruit.     Trachea: Trachea normal.  Cardiovascular:     Rate and Rhythm: Normal  rate and regular rhythm.     Pulses: Normal pulses.          Carotid pulses are 2+ on the right side and 2+ on the left side.      Radial pulses are 2+ on the right side and 2+ on the left side.       Dorsalis pedis pulses are 2+ on the right side and 2+ on the left side.       Posterior tibial pulses are 2+ on the right side and 2+ on the left side.     Heart sounds: Normal heart sounds, S1 normal and S2 normal. No murmur heard.   No friction rub. No gallop.  Pulmonary:     Effort: Pulmonary effort is normal. No respiratory distress.     Breath sounds: Normal breath sounds and air entry. No stridor. No wheezing, rhonchi or rales.  Chest:     Chest wall: No tenderness.     Comments: Breasts: breasts appear normal, no suspicious masses, no skin or nipple changes or axillary nodes, right breast normal without mass, skin or nipple changes or axillary nodes, left breast normal without mass, skin or nipple changes or axillary nodes, unchanged from previous exams  Abdominal:     General: Abdomen is flat. Bowel sounds are normal. There is no distension.     Palpations: Abdomen is soft. There is no mass.     Tenderness: There is no abdominal tenderness. There is no right CVA tenderness, left CVA tenderness, guarding or rebound.     Hernia: No hernia is present.  Genitourinary:    General: Normal vulva.     Exam position: Lithotomy position.     Pubic Area: No rash or pubic lice.      Tanner stage (genital): 5.     Vagina: Normal.     Cervix: Normal.     Uterus: Normal.  Comments: Patient declined internal exam today; cited no complaints Musculoskeletal:        General: No swelling, tenderness, deformity or signs of injury. Normal range of motion.     Cervical back: Full passive range of motion without pain, normal range of motion and neck supple. No edema, rigidity or tenderness. No muscular tenderness.     Right lower leg: No edema.     Left lower leg: No edema.  Lymphadenopathy:      Cervical: No cervical adenopathy.     Right cervical: No superficial, deep or posterior cervical adenopathy.    Left cervical: No superficial, deep or posterior cervical adenopathy.  Skin:    General: Skin is warm and dry.     Capillary Refill: Capillary refill takes less than 2 seconds.     Coloration: Skin is not jaundiced or pale.     Findings: Lesion present. No bruising, erythema or rash.          Comments: Active molluscum rash on inner thighs, currently being treated by derm- under round x3 of treatment  Neurological:     General: No focal deficit present.     Mental Status: She is alert and oriented to person, place, and time. Mental status is at baseline.     GCS: GCS eye subscore is 4. GCS verbal subscore is 5. GCS motor subscore is 6.     Sensory: Sensation is intact. No sensory deficit.     Motor: Motor function is intact. No weakness.     Coordination: Coordination is intact. Coordination normal.     Gait: Gait is intact. Gait normal.  Psychiatric:        Attention and Perception: Attention and perception normal.        Mood and Affect: Mood and affect normal.        Speech: Speech normal.        Behavior: Behavior normal. Behavior is cooperative.        Thought Content: Thought content normal.        Cognition and Memory: Cognition and memory normal.        Judgment: Judgment normal.     Last depression screening scores PHQ 2/9 Scores 08/03/2021 12/26/2020 06/30/2020  PHQ - 2 Score 0 0 0  PHQ- 9 Score 2 2 1    Last fall risk screening Fall Risk  08/03/2021  Falls in the past year? 0  Number falls in past yr: 0  Injury with Fall? 0  Risk for fall due to : No Fall Risks  Follow up -   Last Audit-C alcohol use screening Alcohol Use Disorder Test (AUDIT) 08/03/2021  1. How often do you have a drink containing alcohol? 3  2. How many drinks containing alcohol do you have on a typical day when you are drinking? 0  3. How often do you have six or more drinks on one  occasion? 0  AUDIT-C Score 3  Alcohol Brief Interventions/Follow-up -   A score of 3 or more in women, and 4 or more in men indicates increased risk for alcohol abuse, EXCEPT if all of the points are from question 1   No results found for any visits on 08/03/21.  Assessment & Plan    Routine Health Maintenance and Physical Exam  Exercise Activities and Dietary recommendations  Goals   None     Immunization History  Administered Date(s) Administered   Influenza,inj,Quad PF,6+ Mos 05/10/2013, 05/24/2015, 07/28/2017, 06/07/2018, 06/19/2019   Influenza-Unspecified 06/16/2020, 06/10/2021  PFIZER(Purple Top)SARS-COV-2 Vaccination 11/16/2019, 12/14/2019, 06/16/2020   Pfizer Covid-19 Vaccine Bivalent Booster 25yrs & up 06/10/2021   Tdap 03/15/2011   Zoster Recombinat (Shingrix) 06/19/2019, 10/15/2019    Health Maintenance  Topic Date Due   Pneumococcal Vaccine 72-43 Years old (1 - PCV) Never done   MAMMOGRAM  10/28/2020   PAP SMEAR-Modifier  06/21/2021   TETANUS/TDAP  09/23/2021 (Originally 03/14/2021)   COLONOSCOPY (Pts 45-94yrs Insurance coverage will need to be confirmed)  02/20/2025   INFLUENZA VACCINE  Completed   COVID-19 Vaccine  Completed   Hepatitis C Screening  Completed   HIV Screening  Completed   Zoster Vaccines- Shingrix  Completed   HPV VACCINES  Aged Out    Discussed health benefits of physical activity, and encouraged her to engage in regular exercise appropriate for her age and condition.  Problem List Items Addressed This Visit       Cardiovascular and Mediastinum   Essential (primary) hypertension    Chronic, stable Continue to recommend lower salt diet, heart- healthy foods 150 mins of exercise/week        Digestive   Acid reflux    Belching Recommend journaling of foods/symptoms- use of PRNs to address symptoms Has been taking medication Denies change in diet/change in stools       Relevant Orders   Comprehensive metabolic panel      Musculoskeletal and Integument   Molluscum contagiosum infection    6+ month hx, 3 treatments- f/b derms bandaids over treated areas         Other   Cervical cancer screening    No concerns; repeat PAP with HPV      Relevant Orders   Cytology - PAP   Encounter for screening mammogram for malignant neoplasm of breast    Screening mammogram; no concerns      Relevant Orders   MM 3D SCREEN BREAST BILATERAL   Elevated LDL cholesterol level    Repeat lipid recommend diet low in saturated fat and regular exercise - 30 min at least 5 times per week Recommend increase in diet of healthier fat choices- low fat meats, oils that are not solid at room temperature, nuts, seeds, fish- cod, halibut, salmon, and avocado. Exercise can also increase this number.  Supplemental omega 3's can be taken as well but are not as helpful as dietary/exercise changes.       Relevant Orders   Lipid panel   BMI 34.0-34.9,adult    BMI 34.41 Discussed importance of healthy weight management Discussed diet and exercise       Obesity (BMI 30.0-34.9)    BMI 34.41 Discussed in relation to change in belching/GERD as well as knee pain      Relevant Orders   Comprehensive metabolic panel   Screening for diabetes mellitus    Given obesity and age a1c pending Continue to recommend balanced, lower carb meals. Smaller meal size, adding snacks. Choosing water as drink of choice and increasing purposeful exercise.       Annual physical exam - Primary    Due for eye exam Things to do to keep yourself healthy  - Exercise at least 30-45 minutes a day, 3-4 days a week.  - Eat a low-fat diet with lots of fruits and vegetables, up to 7-9 servings per day.  - Seatbelts can save your life. Wear them always.  - Smoke detectors on every level of your home, check batteries every year.  - Eye Doctor - have an eye exam every  1-2 years  - Safe sex - if you may be exposed to STDs, use a condom.  - Alcohol -  If you  drink, do it moderately, less than 2 drinks per day.  - [place]. Choose someone to speak for you if you are not able.  - Depression is common in our stressful world.If you're feeling down or losing interest in things you normally enjoy, please come in for a visit.  - Violence - If anyone is threatening or hurting you, please call immediately.        Mass of right parotid gland    Denies current swelling; none noticed on exam Repeat labs       Relevant Orders   TSH + free T4   Depression, major, single episode, complete remission ([place])    Stable at this time Aware of resources In a committed relationship Has 2 kids, and grandkids        Return in about 1 year (around 08/03/2022) for annual examination, chonic disease management.     Vonna Kotyk, FNP, have reviewed all documentation for this visit. The documentation on 08/03/21 for the exam, diagnosis, procedures, and orders are all accurate and complete.    Gwyneth Sprout, [place] [phone] (phone) [phone] (fax)  [place]

## 2021-08-02 ENCOUNTER — Other Ambulatory Visit: Payer: Self-pay | Admitting: Physician Assistant

## 2021-08-02 DIAGNOSIS — F325 Major depressive disorder, single episode, in full remission: Secondary | ICD-10-CM

## 2021-08-03 ENCOUNTER — Ambulatory Visit (INDEPENDENT_AMBULATORY_CARE_PROVIDER_SITE_OTHER): Payer: BC Managed Care – PPO | Admitting: Family Medicine

## 2021-08-03 ENCOUNTER — Other Ambulatory Visit: Payer: Self-pay

## 2021-08-03 ENCOUNTER — Other Ambulatory Visit (HOSPITAL_COMMUNITY)
Admission: RE | Admit: 2021-08-03 | Discharge: 2021-08-03 | Disposition: A | Discharge status: Home / Self Care | Payer: BC Managed Care – PPO | Source: Ambulatory Visit | Attending: Family Medicine | Admitting: Family Medicine

## 2021-08-03 ENCOUNTER — Encounter: Payer: Self-pay | Admitting: Family Medicine

## 2021-08-03 VITALS — BP 133/81 | HR 72 | Temp 97.8°F | Ht 68.0 in | Wt 226.3 lb

## 2021-08-03 DIAGNOSIS — Z124 Encounter for screening for malignant neoplasm of cervix: Secondary | ICD-10-CM | POA: Diagnosis not present

## 2021-08-03 DIAGNOSIS — K118 Other diseases of salivary glands: Secondary | ICD-10-CM

## 2021-08-03 DIAGNOSIS — I10 Essential (primary) hypertension: Secondary | ICD-10-CM | POA: Diagnosis not present

## 2021-08-03 DIAGNOSIS — Z1231 Encounter for screening mammogram for malignant neoplasm of breast: Secondary | ICD-10-CM

## 2021-08-03 DIAGNOSIS — Z6834 Body mass index (BMI) 34.0-34.9, adult: Secondary | ICD-10-CM

## 2021-08-03 DIAGNOSIS — Z1151 Encounter for screening for human papillomavirus (HPV): Secondary | ICD-10-CM | POA: Insufficient documentation

## 2021-08-03 DIAGNOSIS — E66811 Obesity, class 1: Secondary | ICD-10-CM

## 2021-08-03 DIAGNOSIS — Z0000 Encounter for general adult medical examination without abnormal findings: Secondary | ICD-10-CM | POA: Diagnosis not present

## 2021-08-03 DIAGNOSIS — E669 Obesity, unspecified: Secondary | ICD-10-CM

## 2021-08-03 DIAGNOSIS — Z113 Encounter for screening for infections with a predominantly sexual mode of transmission: Secondary | ICD-10-CM | POA: Diagnosis not present

## 2021-08-03 DIAGNOSIS — B081 Molluscum contagiosum: Secondary | ICD-10-CM

## 2021-08-03 DIAGNOSIS — Z131 Encounter for screening for diabetes mellitus: Secondary | ICD-10-CM

## 2021-08-03 DIAGNOSIS — F325 Major depressive disorder, single episode, in full remission: Secondary | ICD-10-CM

## 2021-08-03 DIAGNOSIS — K2100 Gastro-esophageal reflux disease with esophagitis, without bleeding: Secondary | ICD-10-CM

## 2021-08-03 DIAGNOSIS — E7800 Pure hypercholesterolemia, unspecified: Secondary | ICD-10-CM | POA: Insufficient documentation

## 2021-08-03 HISTORY — DX: Molluscum contagiosum: B08.1

## 2021-08-03 NOTE — Assessment & Plan Note (Signed)
Belching Recommend journaling of foods/symptoms- use of PRNs to address symptoms Has been taking medication Denies change in diet/change in stools

## 2021-08-03 NOTE — Assessment & Plan Note (Signed)
BMI 34.41 Discussed in relation to change in belching/GERD as well as knee pain

## 2021-08-03 NOTE — Assessment & Plan Note (Signed)
Given obesity and age a1c pending Continue to recommend balanced, lower carb meals. Smaller meal size, adding snacks. Choosing water as drink of choice and increasing purposeful exercise.

## 2021-08-03 NOTE — Assessment & Plan Note (Signed)
Chronic, stable Continue to recommend lower salt diet, heart- healthy foods 150 mins of exercise/week

## 2021-08-03 NOTE — Assessment & Plan Note (Signed)
6+ month hx, 3 treatments- f/b derms bandaids over treated areas

## 2021-08-03 NOTE — Assessment & Plan Note (Signed)
No concerns; repeat PAP with HPV

## 2021-08-03 NOTE — Assessment & Plan Note (Signed)
Denies current swelling; none noticed on exam Repeat labs

## 2021-08-03 NOTE — Assessment & Plan Note (Signed)
Repeat lipid recommend diet low in saturated fat and regular exercise - 30 min at least 5 times per week Recommend increase in diet of healthier fat choices- low fat meats, oils that are not solid at room temperature, nuts, seeds, fish- cod, halibut, salmon, and avocado. Exercise can also increase this number.  Supplemental omega 3's can be taken as well but are not as helpful as dietary/exercise changes.

## 2021-08-03 NOTE — Assessment & Plan Note (Signed)
Stable at this time Aware of resources In a committed relationship Has 2 kids, and grandkids

## 2021-08-03 NOTE — Assessment & Plan Note (Signed)
BMI 34.41 Discussed importance of healthy weight management Discussed diet and exercise

## 2021-08-03 NOTE — Assessment & Plan Note (Signed)
Screening mammogram; no concerns

## 2021-08-03 NOTE — Assessment & Plan Note (Signed)
Due for eye exam Things to do to keep yourself healthy  - Exercise at least 30-45 minutes a day, 3-4 days a week.  - Eat a low-fat diet with lots of fruits and vegetables, up to 7-9 servings per day.  - Seatbelts can save your life. Wear them always.  - Smoke detectors on every level of your home, check batteries every year.  - Eye Doctor - have an eye exam every 1-2 years  - Safe sex - if you may be exposed to STDs, use a condom.  - Alcohol -  If you drink, do it moderately, less than 2 drinks per day.  - [place]. Choose someone to speak for you if you are not able.  - Depression is common in our stressful world.If you're feeling down or losing interest in things you normally enjoy, please come in for a visit.  - Violence - If anyone is threatening or hurting you, please call immediately.

## 2021-08-04 LAB — COMPREHENSIVE METABOLIC PANEL
ALT: 20 IU/L (ref 0–32)
AST: 19 IU/L (ref 0–40)
Albumin/Globulin Ratio: 2.3 — ABNORMAL HIGH (ref 1.2–2.2)
Albumin: 4.6 g/dL (ref 3.8–4.9)
Alkaline Phosphatase: 75 IU/L (ref 44–121)
BUN/Creatinine Ratio: 24 — ABNORMAL HIGH (ref 9–23)
BUN: 20 mg/dL (ref 6–24)
Bilirubin Total: 0.3 mg/dL (ref 0.0–1.2)
CO2: 26 mmol/L (ref 20–29)
Calcium: 9.4 mg/dL (ref 8.7–10.2)
Chloride: 105 mmol/L (ref 96–106)
Creatinine, Ser: 0.85 mg/dL (ref 0.57–1.00)
Globulin, Total: 2 g/dL (ref 1.5–4.5)
Glucose: 92 mg/dL (ref 70–99)
Potassium: 4.1 mmol/L (ref 3.5–5.2)
Sodium: 143 mmol/L (ref 134–144)
Total Protein: 6.6 g/dL (ref 6.0–8.5)
eGFR: 80 mL/min/{1.73_m2} (ref >59)

## 2021-08-04 LAB — LIPID PANEL
Chol/HDL Ratio: 4.6 ratio — ABNORMAL HIGH (ref 0.0–4.4)
Cholesterol, Total: 216 mg/dL — ABNORMAL HIGH (ref 100–199)
HDL: 47 mg/dL (ref >39)
LDL Chol Calc (NIH): 124 mg/dL — ABNORMAL HIGH (ref 0–99)
Triglycerides: 254 mg/dL — ABNORMAL HIGH (ref 0–149)
VLDL Cholesterol Cal: 45 mg/dL — ABNORMAL HIGH (ref 5–40)

## 2021-08-04 LAB — TSH+FREE T4
Free T4: 0.96 ng/dL (ref 0.82–1.77)
TSH: 1.51 u[IU]/mL (ref 0.450–4.500)

## 2021-08-05 ENCOUNTER — Encounter: Payer: Self-pay | Admitting: Dermatology

## 2021-08-07 ENCOUNTER — Other Ambulatory Visit: Payer: Self-pay | Admitting: Family Medicine

## 2021-08-07 DIAGNOSIS — B977 Papillomavirus as the cause of diseases classified elsewhere: Secondary | ICD-10-CM | POA: Insufficient documentation

## 2021-08-07 DIAGNOSIS — R87619 Unspecified abnormal cytological findings in specimens from cervix uteri: Secondary | ICD-10-CM

## 2021-08-07 HISTORY — DX: Unspecified abnormal cytological findings in specimens from cervix uteri: R87.619

## 2021-08-07 LAB — CYTOLOGY - PAP
Chlamydia: NEGATIVE
Comment: NEGATIVE
Comment: NEGATIVE
Comment: NEGATIVE
Comment: NORMAL
Diagnosis: NEGATIVE
HPV 16: NEGATIVE
HPV 18 / 45: NEGATIVE
HSV1: NEGATIVE
HSV2: NEGATIVE
High risk HPV: POSITIVE — AB
Neisseria Gonorrhea: NEGATIVE
Trichomonas: NEGATIVE

## 2021-08-11 ENCOUNTER — Telehealth: Payer: Self-pay

## 2021-08-11 NOTE — Telephone Encounter (Signed)
Patient is scheduled for 09/16/21 with [facility]

## 2021-08-11 NOTE — Telephone Encounter (Signed)
[facility] referring for High risk HPV infection / [place]. Called and left voicemail for patient to call back to be scheduled.

## 2021-08-26 ENCOUNTER — Ambulatory Visit: Payer: BC Managed Care – PPO | Admitting: Dermatology

## 2021-08-26 ENCOUNTER — Other Ambulatory Visit: Payer: Self-pay

## 2021-08-26 DIAGNOSIS — B081 Molluscum contagiosum: Secondary | ICD-10-CM | POA: Diagnosis not present

## 2021-08-26 NOTE — Patient Instructions (Addendum)
Instructions for After In-Office Application of Cantharidin  1. This is a strong medicine; please follow ALL instructions.  2. Gently wash off with soap and water in two hours or sooner as directed by your physician.  3. **WARNING** this medicine can cause severe blistering, blood blisters, infection, and/or scarring if it is not washed off as directed.  4. Your progress will be rechecked in 1-2 months; call sooner if there are any questions or problems.  Cantharidin Plus is a blistering agent that comes from a beetle.  It needs to be washed off in about 2 hours after application.  Although it is painless when applied in office, it may cause symptoms of mild pain and burning several hours later.  Treated areas will swell and turn red, and blisters may form.  Vaseline and a bandaid may be applied until wound has healed.  Once healed, the skin may remain temporarily discolored.  It can take weeks to months for pigmentation to return to normal.  Advised to wash off with soap and water in 2 hours or sooner if it becomes tender before then.  If You Need Anything After Your Visit  If you have any questions or concerns for your doctor, please call our main line at [phone] and press option 4 to reach your doctor's medical assistant. If no one answers, please leave a voicemail as directed and we will return your call as soon as possible. Messages left after 4 pm will be answered the following business day.   You may also send [place] a message via [place]. We typically respond to MyChart messages within 1-2 business days.  For prescription refills, please ask your pharmacy to contact our office. Our fax number is [phone].  If you have an urgent issue when the clinic is closed that cannot wait until the next business day, you can page your doctor at the number below.    Please note that while we do our best to be available for urgent issues outside of office hours, we are not available 24/7.   If  you have an urgent issue and are unable to reach [place], you may choose to seek medical care at your doctor's office, retail clinic, urgent care center, or emergency room.  If you have a medical emergency, please immediately call 911 or go to the emergency department.  Pager Numbers  - Dr. Nehemiah Massed: [phone]  - Dr. Laurence Ferrari: [phone]  - Dr. Nicole Kindred: [phone]  In the event of inclement weather, please call our main line at [phone] for an update on the status of any delays or closures.  Dermatology Medication Tips: Please keep the boxes that topical medications come in in order to help keep track of the instructions about where and how to use these. Pharmacies typically print the medication instructions only on the boxes and not directly on the medication tubes.   If your medication is too expensive, please contact our office at [phone] option 4 or send [place] a message through [place].   We are unable to tell what your co-pay for medications will be in advance as this is different depending on your insurance coverage. However, we may be able to find a substitute medication at lower cost or fill out paperwork to get insurance to cover a needed medication.   If a prior authorization is required to get your medication covered by your insurance company, please allow [place] 1-2 business days to complete this process.  Drug prices often vary depending on where the  prescription is filled and some pharmacies may offer cheaper prices.  The website www.goodrx.com contains coupons for medications through different pharmacies. The prices here do not account for what the cost may be with help from insurance (it may be cheaper with your insurance), but the website can give you the price if you did not use any insurance.  - You can print the associated coupon and take it with your prescription to the pharmacy.  - You may also stop by our office during regular business hours and pick up a GoodRx  coupon card.  - If you need your prescription sent electronically to a different pharmacy, notify our office through [facility] or by phone at [phone] option 4.     Si Usted Necesita Algo Despus de Su Visita  Tambin puede enviarnos un mensaje a travs de Pharmacist, community. Por lo general respondemos a los mensajes de MyChart en el transcurso de 1 a 2 das hbiles.  Para renovar recetas, por favor pida a su farmacia que se ponga en contacto con nuestra oficina. Harland Dingwall de fax es [place] [phone].  Si tiene un asunto urgente cuando la clnica est cerrada y que no puede esperar hasta el siguiente da hbil, puede llamar/localizar a su doctor(a) al nmero que aparece a continuacin.   Por favor, tenga en cuenta que aunque hacemos todo lo posible para estar disponibles para asuntos urgentes fuera del horario de [place], no estamos disponibles las 24 horas del da, los 7 das de la [place].   Si tiene un problema urgente y no puede comunicarse con nosotros, puede optar por buscar atencin mdica  en el consultorio de su doctor(a), en una clnica privada, en un centro de atencin urgente o en una sala de emergencias.  Si tiene Engineering geologist, por favor llame inmediatamente al 911 o vaya a la sala de emergencias.  Nmeros de bper  - Dr. Nehemiah Massed: [phone]  - Dra. Moye: [phone]  - Dra. Nicole Kindred: [phone]  En caso de inclemencias del [place], por favor llame a Johnsie Kindred principal al [phone] para una actualizacin sobre el [place] de cualquier retraso o cierre.  Consejos para la medicacin en dermatologa: Por favor, guarde las cajas en las que vienen los medicamentos de uso tpico para ayudarle a seguir las instrucciones sobre dnde y cmo usarlos. Las farmacias generalmente imprimen las instrucciones del medicamento slo en las cajas y no directamente en los tubos del [place].   Si su medicamento es muy caro, por favor, pngase en contacto con  Zigmund Daniel llamando al [phone] y presione la opcin 4 o envenos un mensaje a travs de Pharmacist, community.   No podemos decirle cul ser su copago por los medicamentos por adelantado ya que esto es diferente dependiendo de la cobertura de su seguro. Sin embargo, es posible que podamos encontrar un medicamento sustituto a Electrical engineer un formulario para que el seguro cubra el medicamento que se considera necesario.   Si se requiere una autorizacin previa para que su compaa de seguros [place] su medicamento, por favor permtanos de 1 a 2 das hbiles para completar este proceso.  Los precios de los medicamentos varan con frecuencia dependiendo del Environmental consultant de dnde se surte la receta y alguna farmacias pueden ofrecer precios ms baratos.  El sitio web www.goodrx.com tiene cupones para medicamentos de Airline pilot. Los precios aqu no tienen en cuenta lo que podra costar con la ayuda del seguro (puede ser ms barato con su seguro), pero el sitio web  puede darle el precio si no utiliz [employer].  - Puede imprimir el cupn correspondiente y llevarlo con su receta a la farmacia.  - Tambin puede pasar por nuestra oficina durante el horario de atencin regular y Charity fundraiser una tarjeta de cupones de GoodRx.  - Si necesita que su receta se enve electrnicamente a una farmacia diferente, informe a nuestra oficina a travs de MyChart de [place] o por telfono llamando al [phone] y presione la opcin 4.

## 2021-08-26 NOTE — Progress Notes (Signed)
° °  Follow-Up Visit   Subjective  Amanda Day is a 56 y.o. female who presents for the following: Molluscum Contagiosum (Patient here today for 1 month follow up at medial thighs, buttocks, gluteal crease. Areas treated with cantharidin plus. Patient advises she had a very vigorous reaction to last treatment and areas are improved. ).  The following portions of the chart were reviewed this encounter and updated as appropriate:   Tobacco   Allergies   Meds   Problems   Med Hx   Surg Hx   Fam Hx       Review of Systems:  No other skin or systemic complaints except as noted in HPI or Assessment and Plan.  Objective  Well appearing patient in no apparent distress; mood and affect are within normal limits.  A focused examination was performed including thighs, buttocks, gluteal crease. Relevant physical exam findings are noted in the Assessment and Plan.  right inner thigh x 2, right buttock x 3, left posterior thigh x 3 (8) Smooth, pink/flesh dome-shaped papules with central umbilication - Discussed viral etiology and contagion.     Assessment & Plan  Molluscum contagiosum right inner thigh x 2, right buttock x 3, left posterior thigh x 3  Cantharidin is a blistering agent that comes from a beetle.  It needs to be washed off after application.  Although it is painless when applied in office, it may cause symptoms of mild pain and burning several hours later.  Treated areas will swell and turn red, and blisters may form.  Vaseline and a bandaid may be applied until wound has healed.  Once healed, the skin may remain temporarily discolored.  It can take weeks to months for pigmentation to return to normal.  Advised to wash off with soap and water in 2 hours or sooner if it becomes tender before then (pt had vigorous reaction after last treatment).   Cantharidin plus at right inner thigh, all others treated with cantharidin.   Destruction of lesion - right inner thigh x 2, right buttock  x 3, left posterior thigh x 3  Destruction method: chemical removal   Informed consent: discussed and consent obtained   Timeout:  patient name, date of birth, surgical site, and procedure verified Chemical destruction method: cantharidin   Chemical destruction method comment:  Cantharidin plus Application time:  2 hours Procedure instructions: patient instructed to wash and dry area   Outcome: patient tolerated procedure well with no complications   Post-procedure details: wound care instructions given     Return in about 4 weeks (around 09/23/2021) for Molluscum.  Graciella Belton, RMA, am acting as scribe for Forest Gleason, MD .  Documentation: I have reviewed the above documentation for accuracy and completeness, and I agree with the above.  Forest Gleason, MD

## 2021-09-02 ENCOUNTER — Encounter: Payer: Self-pay | Admitting: Dermatology

## 2021-09-09 NOTE — Progress Notes (Signed)
A PAP was done with STI testing given high risk HPV.  Thanks Tally Joe FNP

## 2021-09-09 NOTE — Addendum Note (Signed)
Encounter addended by: Gwyneth Sprout, FNP on: 09/09/2021 2:54 PM  Actions taken: Clinical Note Signed

## 2021-09-16 ENCOUNTER — Encounter: Payer: Self-pay | Admitting: Obstetrics and Gynecology

## 2021-09-16 ENCOUNTER — Other Ambulatory Visit: Payer: Self-pay

## 2021-09-16 ENCOUNTER — Ambulatory Visit (INDEPENDENT_AMBULATORY_CARE_PROVIDER_SITE_OTHER): Payer: BC Managed Care – PPO | Admitting: Obstetrics and Gynecology

## 2021-09-16 VITALS — BP 122/72 | Ht 68.0 in | Wt 227.4 lb

## 2021-09-16 DIAGNOSIS — Z708 Other sex counseling: Secondary | ICD-10-CM | POA: Diagnosis not present

## 2021-09-16 NOTE — Progress Notes (Signed)
Patient ID: Amanda Day, female   DOB: 1965-11-17, 56 y.o.   MRN: [id number]  Reason for Consult: Procedure (colposcopy)   Referred by Gwyneth Sprout, FNP  Subjective:     HPI:  Amanda Day is a 56 y.o. female.  She is here today regarding an abnormal Pap smear on 08/03/2021.  This Pap smear result was NILM HPV positive .  She was negative for HPV 1618 or 45.  Prior pap smear on 06/21/2018 was NIL HPV negative.  Gynecological History  Patient's last menstrual period was 05/23/2018.  Past Medical History:  Diagnosis Date   Anxiety    Arthritis    right great toe   Basal cell carcinoma 05/01/2020   Left anterior neck. Nodular. - excised 07/16/2020   GERD (gastroesophageal reflux disease)    OCC   Hypertension    Motion sickness    long car rides, amusement park rides   Seasonal allergies    Family History  Problem Relation Age of Onset   Hypertension Mother    Anxiety disorder Mother    Mitral valve prolapse Mother    Cancer Father        Lung and Kidney   Hypertension Father    Arrhythmia Father    Hypertension Sister    Hypertension Brother    Past Surgical History:  Procedure Laterality Date   COLONOSCOPY N/A 02/21/2015   Procedure: COLONOSCOPY;  Surgeon: Lucilla Lame, MD;  Location: [facility];  Service: Gastroenterology;  Laterality: N/A;   KNEE ARTHROSCOPY Left 10/04/2016   Procedure: ARTHROSCOPY KNEE;  Surgeon: Dereck Leep, MD;  Location: [facility];  Service: Orthopedics;  Laterality: Left;   KNEE ARTHROSCOPY Right 04/06/2017   Procedure: ARTHROSCOPY KNEE, PARTIAL MEDIAL MENISCECTOMY, MEDIAL CHONDROPLASTY;  Surgeon: Dereck Leep, MD;  Location: [facility];  Service: Orthopedics;  Laterality: Right;   laser vaporization of VIN  01/09/2008   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 Please schedule office visit before any future refills. 90 tablet 0   Cholecalciferol (VITAMIN D) 2000 units tablet Take 1 tablet by mouth daily.     fluticasone (FLONASE) 50 MCG/ACT nasal spray Place 1-2 sprays into both nostrils daily. 48 g 0   Glucosamine-Chondroitin-MSM 500-200-150 MG TABS Take 1 tablet by mouth daily.     ibuprofen (ADVIL,MOTRIN) 200 MG tablet Take 400 mg by mouth 3 (three) times daily as needed for headache or mild pain.      loratadine (CLARITIN) 10 MG tablet Take 10 mg by mouth daily as needed.     metoprolol succinate (TOPROL-XL) 50 MG 24 hr tablet Take 1 tablet (50 mg total) by mouth daily. Please schedule office visit before any future refills. 90 tablet 0   montelukast (SINGULAIR) 10 MG tablet Take 1 tablet (10 mg total) by mouth at bedtime. Please schedule office visit before any future refills. 90 tablet 0   omeprazole (PRILOSEC) 10 MG capsule Take 1 capsule by mouth as needed.     pyridoxine (B-6) 100 MG  tablet Take 100 mg by mouth daily.     sertraline (ZOLOFT) 50 MG tablet TAKE 1/2 TO 1 TABLET BY MOUTH EVERY DAY 90 tablet 3   vitamin B-12 (CYANOCOBALAMIN) 1000 MCG tablet Take 1,000 mcg by mouth daily.     albuterol (VENTOLIN HFA) 108 (90 Base) MCG/ACT inhaler Inhale 2 puffs into the lungs every 6 (six) hours as needed for wheezing or shortness of breath. (Patient not taking: Reported on 09/16/2021) 8 g 0   No current facility-administered medications for this visit.    Review of Systems  Constitutional: Negative for chills, fatigue, fever and unexpected weight change.  HENT: Negative for trouble swallowing.  Eyes: Negative for loss of vision.  Respiratory: Negative for cough, shortness of breath and wheezing.  Cardiovascular:  Negative for chest pain, leg swelling, palpitations and syncope.  GI: Negative for abdominal pain, blood in stool, diarrhea, nausea and vomiting.  GU: Negative for difficulty urinating, dysuria, frequency and hematuria.  Musculoskeletal: Negative for back pain, leg pain and joint pain.  Skin: Negative for rash.  Neurological: Negative for dizziness, headaches, light-headedness, numbness and seizures.  Psychiatric: Negative for behavioral problem, confusion, depressed mood and sleep disturbance.       Objective:  Objective   Vitals:   09/16/21 1531  BP: 122/72  Weight: 227 lb 6.4 oz (103.1 kg)  Height: 5\' 8"  (1.727 m)   Body mass index is 34.58 kg/m.  Physical Exam Vitals and nursing note reviewed. Exam conducted with a chaperone present.  Constitutional:      Appearance: Normal appearance.  HENT:     Head: Normocephalic and atraumatic.  Eyes:     Extraocular Movements: Extraocular movements intact.     Pupils: Pupils are equal, round, and reactive to light.  Cardiovascular:     Rate and Rhythm: Normal rate and regular rhythm.  Pulmonary:     Effort: Pulmonary effort is normal.     Breath sounds: Normal breath sounds.  Abdominal:     General: Abdomen is flat.     Palpations: Abdomen is soft.  Musculoskeletal:     Cervical back: Normal range of motion.  Skin:    General: Skin is warm and dry.  Neurological:     General: No focal deficit present.     Mental Status: She is alert and oriented to person, place, and time.  Psychiatric:        Behavior: Behavior normal.        Thought Content: Thought content normal.        Judgment: Judgment normal.    Assessment/Plan:    56 year old with abnormal pap smear result.  Discussed that with the NILM HPV positive (but HPV 16/18 negative) pap smear the current ASCCP guideline is to repeat her Pap smear in 1 year. Advised patient to return in 1 year for a repeat pap smear with HPV testing.  More than 10 minutes were spent  face to face with the patient in the room, reviewing the medical record, labs and images, and coordinating care for the patient. The plan of management was discussed in detail and counseling was provided.     Adrian Prows MD [facility] 09/16/2021 4:26 PM

## 2021-09-17 LAB — HM DIABETES EYE EXAM

## 2021-09-18 ENCOUNTER — Encounter: Payer: Self-pay | Admitting: *Deleted

## 2021-09-20 ENCOUNTER — Other Ambulatory Visit: Payer: Self-pay | Admitting: Family Medicine

## 2021-09-20 DIAGNOSIS — J301 Allergic rhinitis due to pollen: Secondary | ICD-10-CM

## 2021-09-20 DIAGNOSIS — I10 Essential (primary) hypertension: Secondary | ICD-10-CM

## 2021-09-20 NOTE — Telephone Encounter (Signed)
Requested Prescriptions  Pending Prescriptions Disp Refills   fluticasone (FLONASE) 50 MCG/ACT nasal spray [Pharmacy Med Name: FLUTICASONE 50MCG NASAL SP (120) RX] 48 g 0    Sig: SHAKE LIQUID AND USE 1 TO 2 SPRAYS IN EACH NOSTRIL DAILY     Ear, Nose, and Throat: Nasal Preparations - Corticosteroids Passed - 09/20/2021  6:24 AM      Passed - Valid encounter within last 12 months    Recent Outpatient Visits          1 month ago Annual physical exam   [facility] Tally Joe T, FNP   8 months ago Bronchitis due to COVID-19 virus   [facility], Dionne Bucy, MD   1 year ago Salivary gland enlargement   [place], Clearnce Sorrel, [place]   1 year ago Annual physical exam   [place], Clearnce Sorrel, [place]   2 years ago Hordeolum externum of left upper eyelid   [facility] [place], Dionne Bucy, MD      Future Appointments            In 1 week [facility], [place], MD [place]

## 2021-09-20 NOTE — Telephone Encounter (Signed)
Requested Prescriptions  Pending Prescriptions Disp Refills   amLODipine (NORVASC) 10 MG tablet [Pharmacy Med Name: AMLODIPINE BESYLATE 10MG  TABLETS] 90 tablet 0    Sig: TAKE 1 TABLET(10 MG) BY MOUTH DAILY     Cardiovascular:  Calcium Channel Blockers Passed - 09/20/2021  6:24 AM      Passed - Last BP in normal range    BP Readings from Last 1 Encounters:  09/16/21 122/72         Passed - Valid encounter within last 6 months    Recent Outpatient Visits          1 month ago Annual physical exam   [facility] Tally Joe T, FNP   8 months ago Bronchitis due to COVID-19 virus   [facility], Dionne Bucy, MD   1 year ago Salivary gland enlargement   [facility] [place], Clearnce Sorrel, [place]   1 year ago Annual physical exam   [facility] [place] Malling M, [place]   2 years ago Hordeolum externum of left upper eyelid   [facility] [place], Dionne Bucy, MD      Future Appointments            In 1 week [place], [place], MD [facility]            montelukast (SINGULAIR) 10 MG tablet [Pharmacy Med Name: MONTELUKAST 10MG  TABLETS] 90 tablet 0    Sig: TAKE 1 TABLET(10 MG) BY MOUTH AT BEDTIME     Pulmonology:  Leukotriene Inhibitors Passed - 09/20/2021  6:24 AM      Passed - Valid encounter within last 12 months    Recent Outpatient Visits          1 month ago Annual physical exam   [facility] Tally Joe T, FNP   8 months ago Bronchitis due to COVID-19 virus   [facility], Dionne Bucy, MD   1 year ago Salivary gland enlargement   [place], Clearnce Sorrel, [place]   1 year ago Annual physical exam   [place], [place]   2 years ago Hordeolum externum of left upper eyelid   [facility], Dionne Bucy, MD      Future Appointments            In 1 week [facility], [place], MD  [place]

## 2021-09-21 ENCOUNTER — Telehealth: Payer: Self-pay | Admitting: Family Medicine

## 2021-09-21 ENCOUNTER — Other Ambulatory Visit: Payer: Self-pay

## 2021-09-21 DIAGNOSIS — I10 Essential (primary) hypertension: Secondary | ICD-10-CM

## 2021-09-21 NOTE — Telephone Encounter (Signed)
[place] faxed refill request for the following medications:  metoprolol succinate (TOPROL-XL) 50 MG 24 hr tablet   Please advise.

## 2021-09-22 MED ORDER — METOPROLOL SUCCINATE ER 50 MG PO TB24: 50.0000 mg | ORAL_TABLET | Freq: Every day | ORAL | 0 refills | Status: DC

## 2021-09-30 ENCOUNTER — Ambulatory Visit: Payer: BC Managed Care – PPO | Admitting: Dermatology

## 2021-09-30 ENCOUNTER — Other Ambulatory Visit: Payer: Self-pay

## 2021-09-30 DIAGNOSIS — B081 Molluscum contagiosum: Secondary | ICD-10-CM

## 2021-09-30 NOTE — Patient Instructions (Signed)
Cantharidin  is a blistering agent that comes from a beetle.  It needs to be washed off in about 4 hours after application.  Although it is painless when applied in office, it may cause symptoms of mild pain and burning several hours later.  Treated areas will swell and turn red, and blisters may form.  Vaseline and a bandaid may be applied until wound has healed.  Once healed, the skin may remain temporarily discolored.  It can take weeks to months for pigmentation to return to normal.  Advised to wash off with soap and water in 4 hours or sooner if it becomes tender before then.    If You Need Anything After Your Visit  If you have any questions or concerns for your doctor, please call our main line at [phone] and press option 4 to reach your doctor's medical assistant. If no one answers, please leave a voicemail as directed and we will return your call as soon as possible. Messages left after 4 pm will be answered the following business day.   You may also send [place] a message via [place]. We typically respond to MyChart messages within 1-2 business days.  For prescription refills, please ask your pharmacy to contact our office. Our fax number is [phone].  If you have an urgent issue when the clinic is closed that cannot wait until the next business day, you can page your doctor at the number below.    Please note that while we do our best to be available for urgent issues outside of office hours, we are not available 24/7.   If you have an urgent issue and are unable to reach [place], you may choose to seek medical care at your doctor's office, retail clinic, urgent care center, or emergency room.  If you have a medical emergency, please immediately call 911 or go to the emergency department.  Pager Numbers  - Dr. Nehemiah Massed: [phone]  - Dr. Laurence Ferrari: [phone]  - Dr. Nicole Kindred: [phone]  In the event of inclement weather, please call our main line at [phone] for an update on  the status of any delays or closures.  Dermatology Medication Tips: Please keep the boxes that topical medications come in in order to help keep track of the instructions about where and how to use these. Pharmacies typically print the medication instructions only on the boxes and not directly on the medication tubes.   If your medication is too expensive, please contact our office at [phone] option 4 or send [place] a message through [place].   We are unable to tell what your co-pay for medications will be in advance as this is different depending on your insurance coverage. However, we may be able to find a substitute medication at lower cost or fill out paperwork to get insurance to cover a needed medication.   If a prior authorization is required to get your medication covered by your insurance company, please allow [place] 1-2 business days to complete this process.  Drug prices often vary depending on where the prescription is filled and some pharmacies may offer cheaper prices.  The website www.goodrx.com contains coupons for medications through different pharmacies. The prices here do not account for what the cost may be with help from insurance (it may be cheaper with your insurance), but the website can give you the price if you did not use any insurance.  - You can print the associated coupon and take it with your prescription to the pharmacy.  -  You may also stop by our office during regular business hours and pick up a GoodRx coupon card.  - If you need your prescription sent electronically to a different pharmacy, notify our office through [facility] or by phone at [phone] option 4.     Si Usted Necesita Algo Despus de Su Visita  Tambin puede enviarnos un mensaje a travs de Pharmacist, community. Por lo general respondemos a los mensajes de MyChart en el transcurso de 1 a 2 das hbiles.  Para renovar recetas, por favor pida a su farmacia que se ponga en contacto con nuestra  oficina. Harland Dingwall de fax es [place] [phone].  Si tiene un asunto urgente cuando la clnica est cerrada y que no puede esperar hasta el siguiente da hbil, puede llamar/localizar a su doctor(a) al nmero que aparece a continuacin.   Por favor, tenga en cuenta que aunque hacemos todo lo posible para estar disponibles para asuntos urgentes fuera del horario de [place], no estamos disponibles las 24 horas del da, los 7 das de la [place].   Si tiene un problema urgente y no puede comunicarse con nosotros, puede optar por buscar atencin mdica  en el consultorio de su doctor(a), en una clnica privada, en un centro de atencin urgente o en una sala de emergencias.  Si tiene Engineering geologist, por favor llame inmediatamente al 911 o vaya a la sala de emergencias.  Nmeros de bper  - Dr. Nehemiah Massed: [phone]  - Dra. Moye: [phone]  - Dra. Nicole Kindred: [phone]  En caso de inclemencias del [place], por favor llame a Johnsie Kindred principal al [phone] para una actualizacin sobre el [place] de cualquier retraso o cierre.  Consejos para la medicacin en dermatologa: Por favor, guarde las cajas en las que vienen los medicamentos de uso tpico para ayudarle a seguir las instrucciones sobre dnde y cmo usarlos. Las farmacias generalmente imprimen las instrucciones del medicamento slo en las cajas y no directamente en los tubos del [place].   Si su medicamento es muy caro, por favor, pngase en contacto con Zigmund Daniel llamando al [phone] y presione la opcin 4 o envenos un mensaje a travs de Pharmacist, community.   No podemos decirle cul ser su copago por los medicamentos por adelantado ya que esto es diferente dependiendo de la cobertura de su seguro. Sin embargo, es posible que podamos encontrar un medicamento sustituto a Electrical engineer un formulario para que el seguro cubra el medicamento que se considera necesario.   Si se requiere una autorizacin previa para  que su compaa de seguros [place] su medicamento, por favor permtanos de 1 a 2 das hbiles para completar este proceso.  Los precios de los medicamentos varan con frecuencia dependiendo del Environmental consultant de dnde se surte la receta y alguna farmacias pueden ofrecer precios ms baratos.  El sitio web www.goodrx.com tiene cupones para medicamentos de Airline pilot. Los precios aqu no tienen en cuenta lo que podra costar con la ayuda del seguro (puede ser ms barato con su seguro), pero el sitio web puede darle el precio si no utiliz Research scientist (physical sciences).  - Puede imprimir el cupn correspondiente y llevarlo con su receta a la farmacia.  - Tambin puede pasar por nuestra oficina durante el horario de atencin regular y Charity fundraiser una tarjeta de cupones de GoodRx.  - Si necesita que su receta se enve electrnicamente a Chiropodist, informe a nuestra oficina a travs de MyChart de [place] o por telfono llamando al [phone] y  presione la opcin 4.

## 2021-09-30 NOTE — Progress Notes (Signed)
° °  Follow-Up Visit   Subjective  Amanda Day is a 56 y.o. female who presents for the following: Follow-up (Patient here today for follow up on molluscum contagiosum. Patient reports some improvement. ).  The following portions of the chart were reviewed this encounter and updated as appropriate:  Tobacco   Allergies   Meds   Problems   Med Hx   Surg Hx   Fam Hx       Review of Systems: No other skin or systemic complaints except as noted in HPI or Assessment and Plan.   Objective  Well appearing patient in no apparent distress; mood and affect are within normal limits.  A focused examination was performed including legs, buttocks. Relevant physical exam findings are noted in the Assessment and Plan.  Right Medial Thigh (16) Umbilicated skin colored papules   Assessment & Plan  Molluscum contagiosum Right Medial Thigh  right inner thigh x 10, left inner thigh x 4 , right medial buttock 1,  right posterior thigh x 1   Cantharidin is a blistering agent that comes from a beetle.  It needs to be washed off after application.  Although it is painless when applied in office, it may cause symptoms of mild pain and burning several hours later.  Treated areas will swell and turn red, and blisters may form.  Vaseline and a bandaid may be applied until wound has healed.  Once healed, the skin may remain temporarily discolored.  It can take weeks to months for pigmentation to return to normal.  Advised to wash off with soap and water in 4 hours or sooner if it becomes tender before then     Destruction of lesion - Right Medial Thigh  Destruction method: chemical removal   Informed consent: discussed and consent obtained   Timeout:  patient name, date of birth, surgical site, and procedure verified Chemical destruction method: cantharidin   Application time:  4 hours Procedure instructions: patient instructed to wash and dry area   Outcome: patient tolerated procedure well with no  complications   Post-procedure details: wound care instructions given     Return for 4 - 6 week follow up.  I, Ruthell Rummage, CMA, scribed for Alfonso Patten, MD.  Documentation: I have reviewed the above documentation for accuracy and completeness, and I agree with the above.  Forest Gleason, MD

## 2021-10-10 ENCOUNTER — Encounter: Payer: Self-pay | Admitting: Dermatology

## 2021-10-29 ENCOUNTER — Ambulatory Visit: Payer: BC Managed Care – PPO | Admitting: Dermatology

## 2021-10-29 ENCOUNTER — Other Ambulatory Visit: Payer: Self-pay

## 2021-10-29 DIAGNOSIS — L84 Corns and callosities: Secondary | ICD-10-CM

## 2021-10-29 DIAGNOSIS — B081 Molluscum contagiosum: Secondary | ICD-10-CM

## 2021-10-29 DIAGNOSIS — L821 Other seborrheic keratosis: Secondary | ICD-10-CM | POA: Diagnosis not present

## 2021-10-29 NOTE — Progress Notes (Signed)
? ?  Follow-Up Visit ?  ?Subjective  ?Amanda Day is a 56 y.o. female who presents for the following: Molluscum (right inner thigh x 10, left inner thigh x 4 , right medial buttock 1,  right posterior thigh x 1 - previously tx with cantherone plus. Pt thinks she may have a new lesion on her L breast.). ? ? ?The following portions of the chart were reviewed this encounter and updated as appropriate:  ?  ?  ? ?Review of Systems:  No other skin or systemic complaints except as noted in HPI or Assessment and Plan. ? ?Objective  ?Well appearing patient in no apparent distress; mood and affect are within normal limits. ? ?A focused examination was performed including the trunk and extremities. Relevant physical exam findings are noted in the Assessment and Plan. ? ?L med breast x 1, R med thigh x 2, L med thigh x 2, R med buttock x 1 (6) ?Smooth, pink/flesh dome-shaped papules with central umbilication - Discussed viral etiology and contagion.  ? ?R index finger at base ?Focal thickening/induration of the skin palmar aspect. ? ? ? ?Assessment & Plan  ?Molluscum contagiosum ?L med breast x 1, R med thigh x 2, L med thigh x 2, R med buttock x 1 ? ?Improving, few additional lesions treated today with cryotherapy ? ?Molluscum are small wart-like bumps caused by a viral infection in the skin and is easily spread.  It may be more common and more easily spread in children who have eczema, because of dry inflamed skin and frequent scratching.  Use your prescription topical eczema medication as directed if prescribed.  Recommend routine use of mild soap and moisturizing cream to prevent spread.  Do not share towels.  Multiple treatments may be required to clear molluscum.  New spots may occur, even when treated ones clear.  ? ?Destruction of lesion - L med breast x 1, R med thigh x 2, L med thigh x 2, R med buttock x 1 ? ?Destruction method: cryotherapy   ?Informed consent: discussed and consent obtained   ?Lesion destroyed  using liquid nitrogen: Yes   ?Region frozen until ice ball extended beyond lesion: Yes   ?Outcome: patient tolerated procedure well with no complications   ?Post-procedure details: wound care instructions given   ?Additional details:  Prior to procedure, discussed risks of blister formation, small wound, skin dyspigmentation, or rare scar following cryotherapy. Recommend Vaseline ointment to treated areas while healing.  ? ?Callus ?R index finger at base ? ?Benign, observe. If tenderness occurs OK to use OTC Sal. Acid wart remover daily to soften.  ? ? ?Seborrheic Keratoses ?- Stuck-on, waxy, tan-brown papules and/or plaques  ?- Benign-appearing ?- Discussed benign etiology and prognosis. ?- Observe ?- Call for any changes ? ?Return for molluscum follow up in 4-6 wks. ? ?I, Rudell Cobb, CMA, am acting as scribe for Brendolyn Patty, MD . ? ?Documentation: I have reviewed the above documentation for accuracy and completeness, and I agree with the above. ? ?Brendolyn Patty MD  ? ?

## 2021-10-29 NOTE — Patient Instructions (Addendum)
If You Need Anything After Your Visit ? ?If you have any questions or concerns for your doctor, please call our main line at [phone] and press option 4 to reach your doctor's medical assistant. If no one answers, please leave a voicemail as directed and we will return your call as soon as possible. Messages left after 4 pm will be answered the following business day.  ? ?You may also send us a message via MyChart. We typically respond to MyChart messages within 1-2 business days. ? ?For prescription refills, please ask your pharmacy to contact our office. Our fax number is [phone]. ? ?If you have an urgent issue when the clinic is closed that cannot wait until the next business day, you can page your doctor at the number below.   ? ?Please note that while we do our best to be available for urgent issues outside of office hours, we are not available 24/7.  ? ?If you have an urgent issue and are unable to reach us, you may choose to seek medical care at your doctor's office, retail clinic, urgent care center, or emergency room. ? ?If you have a medical emergency, please immediately call 911 or go to the emergency department. ? ?Pager Numbers ? ?- Dr. Kowalski: [phone] ? ?- Dr. Moye: [phone] ? ?- Dr. Stewart: [phone] ? ?In the event of inclement weather, please call our main line at [phone] for an update on the status of any delays or closures. ? ?Dermatology Medication Tips: ?Please keep the boxes that topical medications come in in order to help keep track of the instructions about where and how to use these. Pharmacies typically print the medication instructions only on the boxes and not directly on the medication tubes.  ? ?If your medication is too expensive, please contact our office at [phone] option 4 or send us a message through MyChart.  ? ?We are unable to tell what your co-pay for medications will be in advance as this is different depending on your insurance coverage.  However, we may be able to find a substitute medication at lower cost or fill out paperwork to get insurance to cover a needed medication.  ? ?If a prior authorization is required to get your medication covered by your insurance company, please allow us 1-2 business days to complete this process. ? ?Drug prices often vary depending on where the prescription is filled and some pharmacies may offer cheaper prices. ? ?The website www.goodrx.com contains coupons for medications through different pharmacies. The prices here do not account for what the cost may be with help from insurance (it may be cheaper with your insurance), but the website can give you the price if you did not use any insurance.  ?- You can print the associated coupon and take it with your prescription to the pharmacy.  ?- You may also stop by our office during regular business hours and pick up a GoodRx coupon card.  ?- If you need your prescription sent electronically to a different pharmacy, notify our office through [facility] MyChart or by phone at [phone] option 4. ? ? ? ? ?Si Usted Necesita Algo Despu?s de Su Visita ? ?Tambi?n puede enviarnos un mensaje a trav?s de MyChart. Por lo general respondemos a los mensajes de MyChart en el transcurso de 1 a 2 d?as h?biles. ? ?Para renovar recetas, por favor pida a su farmacia que se ponga en contacto con nuestra oficina. Nuestro n?mero de fax es el [phone]. ? ?Si tiene   un asunto urgente cuando la cl?nica est? cerrada y que no puede esperar hasta el siguiente d?a h?bil, puede llamar/localizar a su doctor(a) al n?mero que aparece a continuaci?n.  ? ?Por favor, tenga en cuenta que aunque hacemos todo lo posible para estar disponibles para asuntos urgentes fuera del horario de oficina, no estamos disponibles las 24 horas del d?a, los 7 d?as de la semana.  ? ?Si tiene un problema urgente y no puede comunicarse con nosotros, puede optar por buscar atenci?n m?dica  en el consultorio de su  doctor(a), en una cl?nica privada, en un centro de atenci?n urgente o en una sala de emergencias. ? ?Si tiene Engineer, maintenance (IT) m?dica, por favor llame inmediatamente al 911 o vaya a la sala de emergencias. ? ?N?meros de b?per ? ?- Dr. Nehemiah Massed: [phone] ? ?- Dra. Moye: [phone] ? ?- Dra. Nicole Kindred: [phone] ? ?En caso de inclemencias del tiempo, por favor llame a nuestra l?nea principal al [phone] para una actualizaci?n sobre el estado de cualquier retraso o cierre. ? ?Consejos para la medicaci?n en dermatolog?a: ?Por favor, guarde las cajas en las que vienen los medicamentos de uso t?pico para ayudarle a seguir las instrucciones sobre d?nde y c?mo usarlos. Las farmacias generalmente imprimen las instrucciones del medicamento s?lo en las cajas y no directamente en los tubos del [place].  ? ?Si su medicamento es muy caro, por favor, p?ngase en contacto con Zigmund Daniel llamando al [phone] y presione la opci?n 4 o env?enos un mensaje a trav?s de MyChart.  ? ?No podemos decirle cu?l ser? su copago por los medicamentos por adelantado ya que esto es diferente dependiendo de la cobertura de su seguro. Sin embargo, es posible que podamos encontrar un medicamento sustituto a Electrical engineer un formulario para que el seguro cubra el medicamento que se considera necesario.  ? ?Si se requiere Ardelia Mems autorizaci?n previa para que su compa??a de seguros [place] su medicamento, por favor perm?tanos de 1 a 2 d?as h?biles para completar este proceso. ? ?Los precios de los medicamentos var?an con frecuencia dependiendo del Environmental consultant de d?nde se surte la receta y alguna farmacias pueden ofrecer precios m?s baratos. ? ?El sitio web www.goodrx.com tiene cupones para medicamentos de Airline pilot. Los precios aqu? no tienen en cuenta lo que podr?a costar con la ayuda del seguro (puede ser m?s barato con su seguro), pero el sitio web puede darle el precio si no utiliz? ning?n seguro.  ?- Puede imprimir el cup?n  correspondiente y llevarlo con su receta a la farmacia.  ?- Tambi?n puede pasar por nuestra oficina durante el horario de atenci?n regular y recoger una tarjeta de cupones de GoodRx.  ?- Si necesita que su receta se env?e electr?nicamente a Chiropodist, informe a nuestra oficina a trav?s de MyChart de [place] o por tel?fono llamando al [phone] y presione la opci?n 4. ?Gentle Skin Care Guide ? ?1. Bathe no more than once a day. ? ?2. Avoid bathing in hot water ? ?3. Use a mild soap like Dove, Vanicream, Cetaphil, CeraVe. Can use Lever 2000 or Cetaphil antibacterial soap ? ?4. Use soap only where you need it. On most days, use it under your arms, between your legs, and on your feet. Let the water rinse other areas unless visibly dirty. ? ?5. When you get out of the bath/shower, use a towel to gently blot your skin dry, don't rub it. ? ?6. While your skin is still a little damp, apply a moisturizing cream such as  Vanicream, CeraVe, Cetaphil, Eucerin, Sarna lotion or plain Vaseline Jelly. For hands apply Neutrogena [place] Hand Cream or Excipial Hand Cream. ? ?7. Reapply moisturizer any time you start to itch or feel dry. ? ?8. Sometimes using free and clear laundry detergents can be helpful. Fabric softener sheets should be avoided. Downy Free & Gentle liquid, or any liquid fabric softener that is free of dyes and perfumes, it acceptable to use ? ?9. If your doctor has given you prescription creams you may apply moisturizers over them  ? ? ?

## 2021-12-19 ENCOUNTER — Other Ambulatory Visit: Payer: Self-pay | Admitting: Family Medicine

## 2021-12-19 DIAGNOSIS — J301 Allergic rhinitis due to pollen: Secondary | ICD-10-CM

## 2021-12-19 DIAGNOSIS — I10 Essential (primary) hypertension: Secondary | ICD-10-CM

## 2021-12-21 NOTE — Telephone Encounter (Signed)
Requested medications are due for refill today.  yes ? ?Requested medications are on the active medications list.  yes ? ?Last refill. 09/20/2021 48 0 refills ? ?Future visit scheduled.   no ? ?Notes to clinic.  Medication refill is not delegated ? ? ? ?Requested Prescriptions  ?Pending Prescriptions Disp Refills  ? fluticasone (FLONASE) 50 MCG/ACT nasal spray [Pharmacy Med Name: FLUTICASONE 50MCG NASAL SP (120) RX] 48 g 0  ?  Sig: SHAKE LIQUID AND USE 1 TO 2 SPRAYS IN EACH NOSTRIL DAILY  ?  ? Not Delegated - Ear, Nose, and Throat: Nasal Preparations - Corticosteroids Failed - 12/19/2021  6:23 AM  ?  ?  Failed - This refill cannot be delegated  ?  ?  Passed - Valid encounter within last 12 months  ?  Recent Outpatient Visits   ? ?      ? 4 months ago Annual physical exam  ? [facility] Gwyneth Sprout, FNP  ? 12 months ago Bronchitis due to COVID-19 virus  ? [facility] [place], Dionne Bucy, MD  ? 1 year ago Salivary gland enlargement  ? [facility] [place], Clearnce Sorrel, [place]  ? 1 year ago Annual physical exam  ? [facility] [place], [place]  ? 2 years ago Hordeolum externum of left upper eyelid  ? [facility] [place], Dionne Bucy, MD  ? ?  ?  ?Future Appointments   ? ?        ? In 1 month Brendolyn Patty, MD [place]  ? ?  ? ? ?  ?  ?  ?Signed Prescriptions Disp Refills  ? montelukast (SINGULAIR) 10 MG tablet 90 tablet 0  ?  Sig: TAKE 1 TABLET(10 MG) BY MOUTH AT BEDTIME  ?  ? Pulmonology:  Leukotriene Inhibitors Passed - 12/19/2021  6:23 AM  ?  ?  Passed - Valid encounter within last 12 months  ?  Recent Outpatient Visits   ? ?      ? 4 months ago Annual physical exam  ? [facility] Gwyneth Sprout, FNP  ? 12 months ago Bronchitis due to COVID-19 virus  ? [facility] [place], Dionne Bucy, MD  ? 1 year ago Salivary gland enlargement  ? [facility] [place], Clearnce Sorrel, [place]  ? 1 year  ago Annual physical exam  ? [facility] [place], [place]  ? 2 years ago Hordeolum externum of left upper eyelid  ? [facility] [place], Dionne Bucy, MD  ? ?  ?  ?Future Appointments   ? ?        ? In 1 month Brendolyn Patty, MD [place]  ? ?  ? ? ?  ?  ?  ? amLODipine (NORVASC) 10 MG tablet 90 tablet 0  ?  Sig: TAKE 1 TABLET(10 MG) BY MOUTH DAILY  ?  ? Cardiovascular: Calcium Channel Blockers 2 Passed - 12/19/2021  6:23 AM  ?  ?  Passed - Last BP in normal range  ?  BP Readings from Last 1 Encounters:  ?09/16/21 122/72  ?  ?  ?  ?  Passed - Last Heart Rate in normal range  ?  Pulse Readings from Last 1 Encounters:  ?08/03/21 72  ?  ?  ?  ?  Passed - Valid encounter within last 6 months  ?  Recent Outpatient Visits   ? ?      ?  4 months ago Annual physical exam  ? [facility] Gwyneth Sprout, FNP  ? 12 months ago Bronchitis due to COVID-19 virus  ? [facility] [place], Dionne Bucy, MD  ? 1 year ago Salivary gland enlargement  ? [facility] [place], Clearnce Sorrel, [place]  ? 1 year ago Annual physical exam  ? [facility] [place], [place]  ? 2 years ago Hordeolum externum of left upper eyelid  ? [facility] [place], Dionne Bucy, MD  ? ?  ?  ?Future Appointments   ? ?        ? In 1 month Brendolyn Patty, MD [place]  ? ?  ? ? ?  ?  ?  ? metoprolol succinate (TOPROL-XL) 50 MG 24 hr tablet 90 tablet 0  ?  Sig: TAKE 1 TABLET(50 MG) BY MOUTH DAILY  ?  ? Cardiovascular:  Beta Blockers Passed - 12/19/2021  6:23 AM  ?  ?  Passed - Last BP in normal range  ?  BP Readings from Last 1 Encounters:  ?09/16/21 122/72  ?  ?  ?  ?  Passed - Last Heart Rate in normal range  ?  Pulse Readings from Last 1 Encounters:  ?08/03/21 72  ?  ?  ?  ?  Passed - Valid encounter within last 6 months  ?  Recent Outpatient Visits   ? ?      ? 4 months ago Annual physical exam  ? [facility] Gwyneth Sprout, FNP  ? 12 months ago Bronchitis due to COVID-19 virus  ? [facility] [place], Dionne Bucy, MD  ? 1 year ago Salivary gland enlargement  ? [facility] [place], Clearnce Sorrel, [place]  ? 1 year ago Annual physical exam  ? [facility] [place], [place]  ? 2 years ago Hordeolum externum of left upper eyelid  ? [facility] [place], Dionne Bucy, MD  ? ?  ?  ?Future Appointments   ? ?        ? In 1 month Brendolyn Patty, MD [place]  ? ?  ? ? ?  ?  ?  ?  ?

## 2021-12-21 NOTE — Telephone Encounter (Signed)
Requested Prescriptions  ?Pending Prescriptions Disp Refills  ?? montelukast (SINGULAIR) 10 MG tablet [Pharmacy Med Name: MONTELUKAST '10MG'$  TABLETS] 90 tablet 0  ?  Sig: TAKE 1 TABLET(10 MG) BY MOUTH AT BEDTIME  ?  ? Pulmonology:  Leukotriene Inhibitors Passed - 12/19/2021  6:23 AM  ?  ?  Passed - Valid encounter within last 12 months  ?  Recent Outpatient Visits   ?      ? 4 months ago Annual physical exam  ? [facility] Gwyneth Sprout, FNP  ? 12 months ago Bronchitis due to COVID-19 virus  ? [facility] [place], Dionne Bucy, MD  ? 1 year ago Salivary gland enlargement  ? [facility] [place], Clearnce Sorrel, [place]  ? 1 year ago Annual physical exam  ? [facility] [place], [place]  ? 2 years ago Hordeolum externum of left upper eyelid  ? [facility] [place], Dionne Bucy, MD  ?  ?  ?Future Appointments   ?        ? In 1 month Brendolyn Patty, MD [place]  ?  ? ?  ?  ?  ?? amLODipine (NORVASC) 10 MG tablet [Pharmacy Med Name: AMLODIPINE BESYLATE '10MG'$  TABLETS] 90 tablet 0  ?  Sig: TAKE 1 TABLET(10 MG) BY MOUTH DAILY  ?  ? Cardiovascular: Calcium Channel Blockers 2 Passed - 12/19/2021  6:23 AM  ?  ?  Passed - Last BP in normal range  ?  BP Readings from Last 1 Encounters:  ?09/16/21 122/72  ?   ?  ?  Passed - Last Heart Rate in normal range  ?  Pulse Readings from Last 1 Encounters:  ?08/03/21 72  ?   ?  ?  Passed - Valid encounter within last 6 months  ?  Recent Outpatient Visits   ?      ? 4 months ago Annual physical exam  ? [facility] Gwyneth Sprout, FNP  ? 12 months ago Bronchitis due to COVID-19 virus  ? [facility] [place], Dionne Bucy, MD  ? 1 year ago Salivary gland enlargement  ? [facility] [place], Clearnce Sorrel, [place]  ? 1 year ago Annual physical exam  ? [facility] [place], [place]  ? 2 years ago Hordeolum externum of left upper eyelid  ? [facility] [place], Dionne Bucy, MD  ?  ?  ?Future Appointments   ?        ? In 1 month Brendolyn Patty, MD [place]  ?  ? ?  ?  ?  ?? fluticasone (FLONASE) 50 MCG/ACT nasal spray [Pharmacy Med Name: FLUTICASONE 50MCG NASAL SP (120) RX] 48 g 0  ?  Sig: SHAKE LIQUID AND USE 1 TO 2 SPRAYS IN EACH NOSTRIL DAILY  ?  ? Not Delegated - Ear, Nose, and Throat: Nasal Preparations - Corticosteroids Failed - 12/19/2021  6:23 AM  ?  ?  Failed - This refill cannot be delegated  ?  ?  Passed - Valid encounter within last 12 months  ?  Recent Outpatient Visits   ?      ? 4 months ago Annual physical exam  ? [facility] Gwyneth Sprout, FNP  ? 12 months ago Bronchitis due to COVID-19 virus  ? [facility] [place], Dionne Bucy, MD  ? 1 year ago Salivary gland enlargement  ? [place], [place]  ?  1 year ago Annual physical exam  ? [place], [place]  ? 2 years ago Hordeolum externum of left upper eyelid  ? [facility] [place], Dionne Bucy, MD  ?  ?  ?Future Appointments   ?        ? In 1 month Brendolyn Patty, MD [place]  ?  ? ?  ?  ?  ?? metoprolol succinate (TOPROL-XL) 50 MG 24 hr tablet [Pharmacy Med Name: METOPROLOL ER SUCCINATE '50MG'$  TABS] 90 tablet 0  ?  Sig: TAKE 1 TABLET(50 MG) BY MOUTH DAILY  ?  ? Cardiovascular:  Beta Blockers Passed - 12/19/2021  6:23 AM  ?  ?  Passed - Last BP in normal range  ?  BP Readings from Last 1 Encounters:  ?09/16/21 122/72  ?   ?  ?  Passed - Last Heart Rate in normal range  ?  Pulse Readings from Last 1 Encounters:  ?08/03/21 72  ?   ?  ?  Passed - Valid encounter within last 6 months  ?  Recent Outpatient Visits   ?      ? 4 months ago Annual physical exam  ? [facility] Gwyneth Sprout, FNP  ? 12 months ago Bronchitis due to COVID-19 virus  ? [facility] [place], Dionne Bucy, MD  ? 1 year ago Salivary gland enlargement  ?  [facility] [place], Clearnce Sorrel, [place]  ? 1 year ago Annual physical exam  ? [facility] [place], [place]  ? 2 years ago Hordeolum externum of left upper eyelid  ? [facility] [place], Dionne Bucy, MD  ?  ?  ?Future Appointments   ?        ? In 1 month Brendolyn Patty, MD [place]  ?  ? ?  ?  ?  ? ?

## 2021-12-29 ENCOUNTER — Ambulatory Visit: Payer: BC Managed Care – PPO | Admitting: Dermatology

## 2022-02-02 ENCOUNTER — Encounter: Payer: Self-pay | Admitting: Dermatology

## 2022-02-02 ENCOUNTER — Ambulatory Visit: Payer: BC Managed Care – PPO | Admitting: Dermatology

## 2022-02-02 DIAGNOSIS — D225 Melanocytic nevi of trunk: Secondary | ICD-10-CM | POA: Diagnosis not present

## 2022-02-02 DIAGNOSIS — B081 Molluscum contagiosum: Secondary | ICD-10-CM | POA: Diagnosis not present

## 2022-02-02 DIAGNOSIS — D229 Melanocytic nevi, unspecified: Secondary | ICD-10-CM

## 2022-02-02 NOTE — Patient Instructions (Addendum)
Cryotherapy Aftercare  Wash gently with soap and water everyday.   Apply Vaseline and Band-Aid daily until healed.    Prior to procedure, discussed risks of blister formation, small wound, skin dyspigmentation, or rare scar following cryotherapy. Recommend Vaseline ointment to treated areas while healing.    Due to recent changes in healthcare laws, you may see results of your pathology and/or laboratory studies on MyChart before the doctors have had a chance to review them. We understand that in some cases there may be results that are confusing or concerning to you. Please understand that not all results are received at the same time and often the doctors may need to interpret multiple results in order to provide you with the best plan of care or course of treatment. Therefore, we ask that you please give [place] 2 business days to thoroughly review all your results before contacting the office for clarification. Should we see a critical lab result, you will be contacted sooner.   If You Need Anything After Your Visit  If you have any questions or concerns for your doctor, please call our main line at [phone] and press option 4 to reach your doctor's medical assistant. If no one answers, please leave a voicemail as directed and we will return your call as soon as possible. Messages left after 4 pm will be answered the following business day.   You may also send [place] a message via [place]. We typically respond to MyChart messages within 1-2 business days.  For prescription refills, please ask your pharmacy to contact our office. Our fax number is [phone].  If you have an urgent issue when the clinic is closed that cannot wait until the next business day, you can page your doctor at the number below.    Please note that while we do our best to be available for urgent issues outside of office hours, we are not available 24/7.   If you have an urgent issue and are unable to reach [place], you may  choose to seek medical care at your doctor's office, retail clinic, urgent care center, or emergency room.  If you have a medical emergency, please immediately call 911 or go to the emergency department.  Pager Numbers  - Dr. Nehemiah Massed: [phone]  - Dr. Laurence Ferrari: [phone]  - Dr. Nicole Kindred: [phone]  In the event of inclement weather, please call our main line at [phone] for an update on the status of any delays or closures.  Dermatology Medication Tips: Please keep the boxes that topical medications come in in order to help keep track of the instructions about where and how to use these. Pharmacies typically print the medication instructions only on the boxes and not directly on the medication tubes.   If your medication is too expensive, please contact our office at [phone] option 4 or send [place] a message through [place].   We are unable to tell what your co-pay for medications will be in advance as this is different depending on your insurance coverage. However, we may be able to find a substitute medication at lower cost or fill out paperwork to get insurance to cover a needed medication.   If a prior authorization is required to get your medication covered by your insurance company, please allow [place] 1-2 business days to complete this process.  Drug prices often vary depending on where the prescription is filled and some pharmacies may offer cheaper prices.  The website www.goodrx.com contains coupons for medications through different pharmacies.  The prices here do not account for what the cost may be with help from insurance (it may be cheaper with your insurance), but the website can give you the price if you did not use any insurance.  - You can print the associated coupon and take it with your prescription to the pharmacy.  - You may also stop by our office during regular business hours and pick up a GoodRx coupon card.  - If you need your prescription sent  electronically to a different pharmacy, notify our office through [facility] or by phone at [phone] option 4.     Si Usted Necesita Algo Despus de Su Visita  Tambin puede enviarnos un mensaje a travs de Pharmacist, community. Por lo general respondemos a los mensajes de MyChart en el transcurso de 1 a 2 das hbiles.  Para renovar recetas, por favor pida a su farmacia que se ponga en contacto con nuestra oficina. Harland Dingwall de fax es [place] [phone].  Si tiene un asunto urgente cuando la clnica est cerrada y que no puede esperar hasta el siguiente da hbil, puede llamar/localizar a su doctor(a) al nmero que aparece a continuacin.   Por favor, tenga en cuenta que aunque hacemos todo lo posible para estar disponibles para asuntos urgentes fuera del horario de [place], no estamos disponibles las 24 horas del da, los 7 das de la [place].   Si tiene un problema urgente y no puede comunicarse con nosotros, puede optar por buscar atencin mdica  en el consultorio de su doctor(a), en una clnica privada, en un centro de atencin urgente o en una sala de emergencias.  Si tiene Engineering geologist, por favor llame inmediatamente al 911 o vaya a la sala de emergencias.  Nmeros de bper  - Dr. Nehemiah Massed: [phone]  - Dra. Moye: [phone]  - Dra. Nicole Kindred: [phone]  En caso de inclemencias del [place], por favor llame a Johnsie Kindred principal al [phone] para una actualizacin sobre el [place] de cualquier retraso o cierre.  Consejos para la medicacin en dermatologa: Por favor, guarde las cajas en las que vienen los medicamentos de uso tpico para ayudarle a seguir las instrucciones sobre dnde y cmo usarlos. Las farmacias generalmente imprimen las instrucciones del medicamento slo en las cajas y no directamente en los tubos del [place].   Si su medicamento es muy caro, por favor, pngase en contacto con Zigmund Daniel llamando al [phone] y presione la  opcin 4 o envenos un mensaje a travs de Pharmacist, community.   No podemos decirle cul ser su copago por los medicamentos por adelantado ya que esto es diferente dependiendo de la cobertura de su seguro. Sin embargo, es posible que podamos encontrar un medicamento sustituto a Electrical engineer un formulario para que el seguro cubra el medicamento que se considera necesario.   Si se requiere una autorizacin previa para que su compaa de seguros [place] su medicamento, por favor permtanos de 1 a 2 das hbiles para completar este proceso.  Los precios de los medicamentos varan con frecuencia dependiendo del Environmental consultant de dnde se surte la receta y alguna farmacias pueden ofrecer precios ms baratos.  El sitio web www.goodrx.com tiene cupones para medicamentos de Airline pilot. Los precios aqu no tienen en cuenta lo que podra costar con la ayuda del seguro (puede ser ms barato con su seguro), pero el sitio web puede darle el precio si no utiliz Research scientist (physical sciences).  - Puede imprimir el cupn correspondiente y llevarlo con su receta  a la farmacia.  - Tambin puede pasar por nuestra oficina durante el horario de atencin regular y Charity fundraiser una tarjeta de cupones de GoodRx.  - Si necesita que su receta se enve electrnicamente a una farmacia diferente, informe a nuestra oficina a travs de MyChart de [place] o por telfono llamando al [phone] y presione la opcin 4.

## 2022-02-02 NOTE — Progress Notes (Signed)
   Follow-Up Visit   Subjective  Amanda Day is a 56 y.o. female who presents for the following: Molluscum Contagiosum (3 month recheck. Left breast, thighs, right buttock. Hx of LN2 treatment. Thinks lesions may be resolved).  The patient has spots, moles and lesions to be evaluated, some may be new or changing and the patient has concerns that these could be cancer.   The following portions of the chart were reviewed this encounter and updated as appropriate:      Review of Systems: No other skin or systemic complaints except as noted in HPI or Assessment and Plan.   Objective  Well appearing patient in no apparent distress; mood and affect are within normal limits.  A focused examination was performed including face, chest, thighs, buttocks. Relevant physical exam findings are noted in the Assessment and Plan.  intermammary x3 (3) Pink scaly papules   right upper buttock 47m brown macule with lighter edge   Assessment & Plan  Molluscum contagiosum intermammary x3  Vrs ISKs - Improving  Molluscum are small wart-like bumps caused by a viral infection in the skin and is easily spread.  It may be more common and more easily spread in children who have eczema, because of dry inflamed skin and frequent scratching.  Use your prescription topical eczema medication as directed if prescribed.  Recommend routine use of mild soap and moisturizing cream to prevent spread.  Do not share towels.  Multiple treatments may be required to clear molluscum.  New spots may occur, even when treated ones clear.  Destruction of lesion - intermammary x3  Destruction method: cryotherapy   Informed consent: discussed and consent obtained   Lesion destroyed using liquid nitrogen: Yes   Region frozen until ice ball extended beyond lesion: Yes   Outcome: patient tolerated procedure well with no complications   Post-procedure details: wound care instructions given   Additional details:  Prior to  procedure, discussed risks of blister formation, small wound, skin dyspigmentation, or rare scar following cryotherapy. Recommend Vaseline ointment to treated areas while healing.   Nevus right upper buttock  Benign-appearing.  Observation.  Call clinic for new or changing lesions.  Recommend daily use of broad spectrum spf 30+ sunscreen to sun-exposed areas.     Return in about 3 months (around 05/05/2022) for TBSE, HxBCC.   I, JEmelia Salisbury CMA, am acting as scribe for TBrendolyn Patty MD.  Documentation: I have reviewed the above documentation for accuracy and completeness, and I agree with the above.  TBrendolyn PattyMD

## 2022-03-19 ENCOUNTER — Other Ambulatory Visit: Payer: Self-pay | Admitting: Family Medicine

## 2022-03-19 DIAGNOSIS — I10 Essential (primary) hypertension: Secondary | ICD-10-CM

## 2022-03-19 DIAGNOSIS — J301 Allergic rhinitis due to pollen: Secondary | ICD-10-CM

## 2022-04-18 ENCOUNTER — Other Ambulatory Visit: Payer: Self-pay | Admitting: Family Medicine

## 2022-04-18 DIAGNOSIS — I10 Essential (primary) hypertension: Secondary | ICD-10-CM

## 2022-04-18 DIAGNOSIS — J301 Allergic rhinitis due to pollen: Secondary | ICD-10-CM

## 2022-05-13 ENCOUNTER — Ambulatory Visit: Payer: BC Managed Care – PPO | Admitting: Dermatology

## 2022-05-13 ENCOUNTER — Encounter: Payer: Self-pay | Admitting: Dermatology

## 2022-05-13 DIAGNOSIS — Z1283 Encounter for screening for malignant neoplasm of skin: Secondary | ICD-10-CM | POA: Diagnosis not present

## 2022-05-13 DIAGNOSIS — Z85828 Personal history of other malignant neoplasm of skin: Secondary | ICD-10-CM

## 2022-05-13 DIAGNOSIS — L814 Other melanin hyperpigmentation: Secondary | ICD-10-CM

## 2022-05-13 DIAGNOSIS — L820 Inflamed seborrheic keratosis: Secondary | ICD-10-CM

## 2022-05-13 DIAGNOSIS — D229 Melanocytic nevi, unspecified: Secondary | ICD-10-CM

## 2022-05-13 DIAGNOSIS — L570 Actinic keratosis: Secondary | ICD-10-CM

## 2022-05-13 DIAGNOSIS — L821 Other seborrheic keratosis: Secondary | ICD-10-CM

## 2022-05-13 DIAGNOSIS — L578 Other skin changes due to chronic exposure to nonionizing radiation: Secondary | ICD-10-CM | POA: Diagnosis not present

## 2022-05-13 DIAGNOSIS — D1801 Hemangioma of skin and subcutaneous tissue: Secondary | ICD-10-CM

## 2022-05-13 NOTE — Progress Notes (Signed)
Follow-Up Visit   Subjective  Amanda Day is a 56 y.o. 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 Forehead itching lately, happens randomly).  The patient has spots, moles and lesions to be evaluated, some may be new or changing and the patient has concerns that these could be cancer.  The following portions of the chart were reviewed this encounter and updated as appropriate:  Tobacco  Allergies  Meds  Problems  Med Hx  Surg Hx  Fam Hx      Review of Systems: No other skin or systemic complaints except as noted in HPI or Assessment and Plan.   Objective  Well appearing patient in no apparent distress; mood and affect are within normal limits.  A full examination was performed including scalp, head, eyes, ears, nose, lips, neck, chest, axillae, abdomen, back, buttocks, bilateral upper extremities, bilateral lower extremities, hands, feet, fingers, toes, fingernails, and toenails. All findings within normal limits unless otherwise noted below.  left forehead Erythematous thin papules/macules with gritty scale.   Right Upper Back x1 Erythematous keratotic or waxy stuck-on papule or plaque.   Assessment & Plan   History of Basal Cell Carcinoma of the Skin. Left anterior neck. Excised. 07/16/2020. - No evidence of recurrence today - Recommend regular full body skin exams - Recommend daily broad spectrum sunscreen SPF 30+ to sun-exposed areas, reapply every 2 hours as needed.  - Call if any new or changing lesions are noted between office visits  Lentigines - Scattered tan macules - Due to sun exposure - Benign-appearing, observe - Recommend daily broad spectrum sunscreen SPF 30+ to sun-exposed areas, reapply every 2 hours as needed. - Call for any changes  Seborrheic Keratoses - Stuck-on, waxy, tan-brown papules and/or plaques  - Benign-appearing - Discussed benign etiology and prognosis. - Observe - Call for any  changes  Melanocytic Nevi - Tan-brown and/or pink-flesh-colored symmetric macules and papules - Benign appearing on exam today - Observation - Call clinic for new or changing moles - Recommend daily use of broad spectrum spf 30+ sunscreen to sun-exposed areas.  - Check toenails when remove polish.   Hemangiomas - Red papules - Discussed benign nature - Observe - Call for any changes  Actinic Damage - Chronic condition, secondary to cumulative UV/sun exposure - diffuse scaly erythematous macules with underlying dyspigmentation - Recommend daily broad spectrum sunscreen SPF 30+ to sun-exposed areas, reapply every 2 hours as needed.  - Staying in the shade or wearing long sleeves, sun glasses (UVA+UVB protection) and wide brim hats (4-inch brim around the entire circumference of the hat) are also recommended for sun protection.  - Call for new or changing lesions.  Skin cancer screening performed today.   AK (actinic keratosis) left forehead  Actinic keratoses are precancerous spots that appear secondary to cumulative UV radiation exposure/sun exposure over time. They are chronic with expected duration over 1 year. A portion of actinic keratoses will progress to squamous cell carcinoma of the skin. It is not possible to reliably predict which spots will progress to skin cancer and so treatment is recommended to prevent development of skin cancer.  Recommend daily broad spectrum sunscreen SPF 30+ to sun-exposed areas, reapply every 2 hours as needed.  Recommend staying in the shade or wearing long sleeves, sun glasses (UVA+UVB protection) and wide brim hats (4-inch brim around the entire circumference of the hat). Call for new or changing lesions.  Very thin lesion. Will watch for now. Wear  sunscreen daily. Call for any worsening.  Inflamed seborrheic keratosis Right Upper Back x1  Symptomatic, irritating, patient would like treated.  Destruction of lesion - Right Upper Back  x1  Destruction method: cryotherapy   Informed consent: discussed and consent obtained   Lesion destroyed using liquid nitrogen: Yes   Region frozen until ice ball extended beyond lesion: Yes   Outcome: patient tolerated procedure well with no complications   Post-procedure details: wound care instructions given   Additional details:  Prior to procedure, discussed risks of blister formation, small wound, skin dyspigmentation, or rare scar following cryotherapy. Recommend Vaseline ointment to treated areas while healing.    Return in about 1 year (around 05/14/2023) for TBSE, HxBCC.  I, Emelia Salisbury, CMA, am acting as scribe for Forest Gleason, MD.  Documentation: I have reviewed the above documentation for accuracy and completeness, and I agree with the above.  Forest Gleason, MD

## 2022-05-13 NOTE — Patient Instructions (Addendum)
Cryotherapy Aftercare  Wash gently with soap and water everyday.   Apply Vaseline daily until healed.    Recommend taking Heliocare sun protection supplement daily in sunny weather for additional sun protection. For maximum protection on the sunniest days, you can take up to 2 capsules of regular Heliocare OR take 1 capsule of Heliocare Ultra. For prolonged exposure (such as a full day in the sun), you can repeat your dose of the supplement 4 hours after your first dose. Heliocare can be purchased at  [facility], at some [facility] or at www.heliocare.com.     Recommend daily broad spectrum sunscreen SPF 30+ to sun-exposed areas, reapply every 2 hours as needed. Call for new or changing lesions.  Staying in the shade or wearing long sleeves, sun glasses (UVA+UVB protection) and wide brim hats (4-inch brim around the entire circumference of the hat) are also recommended for sun protection.      Melanoma ABCDEs  Melanoma is the most dangerous type of skin cancer, and is the leading cause of death from skin disease.  You are more likely to develop melanoma if you: Have light-colored skin, light-colored eyes, or red or blond hair Spend a lot of time in the sun Tan regularly, either outdoors or in a tanning bed Have had blistering sunburns, especially during childhood Have a close family member who has had a melanoma Have atypical moles or large birthmarks  Early detection of melanoma is key since treatment is typically straightforward and cure rates are extremely high if we catch it early.   The first sign of melanoma is often a change in a mole or a new dark spot.  The ABCDE system is a way of remembering the signs of melanoma.  A for asymmetry:  The two halves do not match. B for border:  The edges of the growth are irregular. C for color:  A mixture of colors are present instead of an even brown color. D for diameter:  Melanomas are usually (but not always) greater than 6mm -  the size of a pencil eraser. E for evolution:  The spot keeps changing in size, shape, and color.  Please check your skin once per month between visits. You can use a small mirror in front and a large mirror behind you to keep an eye on the back side or your body.   If you see any new or changing lesions before your next follow-up, please call to schedule a visit.  Please continue daily skin protection including broad spectrum sunscreen SPF 30+ to sun-exposed areas, reapplying every 2 hours as needed when you're outdoors.   Staying in the shade or wearing long sleeves, sun glasses (UVA+UVB protection) and wide brim hats (4-inch brim around the entire circumference of the hat) are also recommended for sun protection.     Due to recent changes in healthcare laws, you may see results of your pathology and/or laboratory studies on MyChart before the doctors have had a chance to review them. We understand that in some cases there may be results that are confusing or concerning to you. Please understand that not all results are received at the same time and often the doctors may need to interpret multiple results in order to provide you with the best plan of care or course of treatment. Therefore, we ask that you please give us 2 business days to thoroughly review all your results before contacting the office for clarification. Should we see a critical lab result, you will   be contacted sooner.   If You Need Anything After Your Visit  If you have any questions or concerns for your doctor, please call our main line at [phone] and press option 4 to reach your doctor's medical assistant. If no one answers, please leave a voicemail as directed and we will return your call as soon as possible. Messages left after 4 pm will be answered the following business day.   You may also send us a message via MyChart. We typically respond to MyChart messages within 1-2 business days.  For prescription refills,  please ask your pharmacy to contact our office. Our fax number is [phone].  If you have an urgent issue when the clinic is closed that cannot wait until the next business day, you can page your doctor at the number below.    Please note that while we do our best to be available for urgent issues outside of office hours, we are not available 24/7.   If you have an urgent issue and are unable to reach us, you may choose to seek medical care at your doctor's office, retail clinic, urgent care center, or emergency room.  If you have a medical emergency, please immediately call 911 or go to the emergency department.  Pager Numbers  - Dr. Kowalski: [phone]  - Dr. Moye: [phone]  - Dr. Stewart: [phone]  In the event of inclement weather, please call our main line at [phone] for an update on the status of any delays or closures.  Dermatology Medication Tips: Please keep the boxes that topical medications come in in order to help keep track of the instructions about where and how to use these. Pharmacies typically print the medication instructions only on the boxes and not directly on the medication tubes.   If your medication is too expensive, please contact our office at [phone] option 4 or send us a message through MyChart.   We are unable to tell what your co-pay for medications will be in advance as this is different depending on your insurance coverage. However, we may be able to find a substitute medication at lower cost or fill out paperwork to get insurance to cover a needed medication.   If a prior authorization is required to get your medication covered by your insurance company, please allow us 1-2 business days to complete this process.  Drug prices often vary depending on where the prescription is filled and some pharmacies may offer cheaper prices.  The website www.goodrx.com contains coupons for medications through different pharmacies. The prices  here do not account for what the cost may be with help from insurance (it may be cheaper with your insurance), but the website can give you the price if you did not use any insurance.  - You can print the associated coupon and take it with your prescription to the pharmacy.  - You may also stop by our office during regular business hours and pick up a GoodRx coupon card.  - If you need your prescription sent electronically to a different pharmacy, notify our office through [facility] [place] MyChart or by phone at [phone] option 4.     Si Usted Necesita Algo Despus de Su Visita  Tambin puede enviarnos un mensaje a travs de MyChart. Por lo general respondemos a los mensajes de MyChart en el transcurso de 1 a 2 das hbiles.  Para renovar recetas, por favor pida a su farmacia que se ponga en contacto con nuestra oficina. Nuestro nmero de fax   es el [phone].  Si tiene un asunto urgente cuando la clnica est cerrada y que no puede esperar hasta el siguiente da hbil, puede llamar/localizar a su doctor(a) al nmero que aparece a continuacin.   Por favor, tenga en cuenta que aunque hacemos todo lo posible para estar disponibles para asuntos urgentes fuera del horario de oficina, no estamos disponibles las 24 horas del da, los 7 das de la semana.   Si tiene un problema urgente y no puede comunicarse con nosotros, puede optar por buscar atencin mdica  en el consultorio de su doctor(a), en una clnica privada, en un centro de atencin urgente o en una sala de emergencias.  Si tiene una emergencia mdica, por favor llame inmediatamente al 911 o vaya a la sala de emergencias.  Nmeros de bper  - Dr. Kowalski: [phone]  - Dra. Moye: [phone]  - Dra. Stewart: [phone]  En caso de inclemencias del tiempo, por favor llame a nuestra lnea principal al [phone] para una actualizacin sobre el estado de cualquier retraso o cierre.  Consejos para la medicacin en  dermatologa: Por favor, guarde las cajas en las que vienen los medicamentos de uso tpico para ayudarle a seguir las instrucciones sobre dnde y cmo usarlos. Las farmacias generalmente imprimen las instrucciones del medicamento slo en las cajas y no directamente en los tubos del medicamento.   Si su medicamento es muy caro, por favor, pngase en contacto con nuestra oficina llamando al [phone] y presione la opcin 4 o envenos un mensaje a travs de MyChart.   No podemos decirle cul ser su copago por los medicamentos por adelantado ya que esto es diferente dependiendo de la cobertura de su seguro. Sin embargo, es posible que podamos encontrar un medicamento sustituto a menor costo o llenar un formulario para que el seguro cubra el medicamento que se considera necesario.   Si se requiere una autorizacin previa para que su compaa de seguros cubra su medicamento, por favor permtanos de 1 a 2 das hbiles para completar este proceso.  Los precios de los medicamentos varan con frecuencia dependiendo del lugar de dnde se surte la receta y alguna farmacias pueden ofrecer precios ms baratos.  El sitio web www.goodrx.com tiene cupones para medicamentos de diferentes farmacias. Los precios aqu no tienen en cuenta lo que podra costar con la ayuda del seguro (puede ser ms barato con su seguro), pero el sitio web puede darle el precio si no utiliz ningn seguro.  - Puede imprimir el cupn correspondiente y llevarlo con su receta a la farmacia.  - Tambin puede pasar por nuestra oficina durante el horario de atencin regular y recoger una tarjeta de cupones de GoodRx.  - Si necesita que su receta se enve electrnicamente a una farmacia diferente, informe a nuestra oficina a travs de MyChart de [place] o por telfono llamando al [phone] y presione la opcin 4.  

## 2022-05-15 ENCOUNTER — Encounter: Payer: Self-pay | Admitting: Dermatology

## 2022-05-28 ENCOUNTER — Telehealth: Payer: Self-pay | Admitting: Family Medicine

## 2022-05-28 DIAGNOSIS — J301 Allergic rhinitis due to pollen: Secondary | ICD-10-CM

## 2022-05-28 DIAGNOSIS — I10 Essential (primary) hypertension: Secondary | ICD-10-CM

## 2022-05-28 NOTE — Telephone Encounter (Signed)
Medication Refill - Medication: Amlodipine 10 mg .  Metoprolol 50 mg  Has the patient contacted their pharmacy? Yes.   (Agent: If no, request that the patient contact the pharmacy for the refill. If patient does not wish to contact the pharmacy document the reason why and proceed with request.) (Agent: If yes, when and what did the pharmacy advise?)  Preferred Pharmacy (with phone number or street name): [facility] [address] Has the patient been seen for an appointment in the last year OR does the patient have an upcoming appointment? Yes.    Agent: Please be advised that RX refills may take up to 3 business days. We ask that you follow-up with your pharmacy.

## 2022-05-28 NOTE — Telephone Encounter (Signed)
Requested medication (s) are due for refill today:yes  Requested medication (s) are on the active medication list:yes  Last refill:  03/19/22  Future visit scheduled: yes  Notes to clinic:  Unable to refill per protocol, courtesy refill already given, routing for provider approval.      Requested Prescriptions  Pending Prescriptions Disp Refills   amLODipine (NORVASC) 10 MG tablet 30 tablet 0    Sig: TAKE 1 TABLET(10 MG) BY MOUTH DAILY     Cardiovascular: Calcium Channel Blockers 2 Failed - 05/28/2022  3:45 PM      Failed - Valid encounter within last 6 months    Recent Outpatient Visits           9 months ago Annual physical exam   [facility] Gwyneth Sprout, FNP   1 year ago Bronchitis due to COVID-19 virus   [facility], Dionne Bucy, MD   1 year ago Salivary gland enlargement   [facility], Clearnce Sorrel, [place]   1 year ago Annual physical exam   [facility] Fenton Malling M, [place]   2 years ago Hordeolum externum of left upper eyelid   [facility] [place], Dionne Bucy, MD       Future Appointments             In 2 months Simmons-Robinson, Riki Sheer, MD [facility], [place] - Last BP in normal range    BP Readings from Last 1 Encounters:  09/16/21 122/72         Passed - Last Heart Rate in normal range    Pulse Readings from Last 1 Encounters:  08/03/21 72          metoprolol succinate (TOPROL-XL) 50 MG 24 hr tablet 30 tablet 0    Sig: TAKE 1 TABLET(50 MG) BY MOUTH DAILY     Cardiovascular:  Beta Blockers Failed - 05/28/2022  3:45 PM      Failed - Valid encounter within last 6 months    Recent Outpatient Visits           9 months ago Annual physical exam   [facility] Gwyneth Sprout, FNP   1 year ago Bronchitis due to COVID-19 virus   [facility], Dionne Bucy, MD   1 year ago Salivary gland  enlargement   [place], Clearnce Sorrel, [place]   1 year ago Annual physical exam   [facility] Fenton Malling M, [place]   2 years ago Hordeolum externum of left upper eyelid   [facility] [place], Dionne Bucy, MD       Future Appointments             In 2 months Simmons-Robinson, Riki Sheer, MD [facility] [place] BP in normal range    BP Readings from Last 1 Encounters:  09/16/21 122/72         Passed - Last Heart Rate in normal range    Pulse Readings from Last 1 Encounters:  08/03/21 72

## 2022-05-31 NOTE — Telephone Encounter (Signed)
PT called upset because she said she has always came in once a year and gotten refills between the cpe.  She was called this morning and left a message that her refill request could not be filled because she needed to be seen .  She said she was not advised of this before.   She is completely out of the medications listed.  She also stated she was called on her work phone and not her cell phone which she prefers.

## 2022-06-01 MED ORDER — FLUTICASONE PROPIONATE 50 MCG/ACT NA SUSP: NASAL | 0 refills | Status: DC

## 2022-06-01 MED ORDER — AMLODIPINE BESYLATE 10 MG PO TABS: ORAL_TABLET | ORAL | 0 refills | Status: DC

## 2022-06-01 MED ORDER — MONTELUKAST SODIUM 10 MG PO TABS: ORAL_TABLET | ORAL | 0 refills | Status: DC

## 2022-06-01 MED ORDER — METOPROLOL SUCCINATE ER 50 MG PO TB24: ORAL_TABLET | ORAL | 0 refills | Status: DC

## 2022-06-01 NOTE — Telephone Encounter (Signed)
Will provide refills and discuss dose changes and need for updated lab work at visit scheduled in Dec with me    Amanda Foster, MD  [facility]  [phone]

## 2022-06-01 NOTE — Telephone Encounter (Signed)
Spoke with patient and sent to Dr. Jacinto Reap and Dr. Quentin Cornwall to see if it can be refilled sooner. I did not refill due to her not being seen in over 6 months and no refills.

## 2022-06-01 NOTE — Addendum Note (Signed)
Addended by: Adolph Pollack on: 06/01/2022 10:33 AM   Modules accepted: Orders

## 2022-06-17 ENCOUNTER — Other Ambulatory Visit: Payer: Self-pay | Admitting: Family Medicine

## 2022-06-17 DIAGNOSIS — J301 Allergic rhinitis due to pollen: Secondary | ICD-10-CM

## 2022-08-04 NOTE — Progress Notes (Unsigned)
I,Amanda Day,acting as a scribe for [employer], MD.,have documented all relevant documentation on the behalf of Amanda Foster, MD,as directed by  Amanda Foster, MD while in the presence of Amanda Foster, MD.   Complete physical exam   Patient: Amanda Day   DOB: Nov 02, 1965   56 y.o. Female  MRN: [id number] Visit Date: 08/05/2022  Today's healthcare provider: [name], MD   Chief Complaint  Patient presents with   Annual Exam   Subjective    Amanda Day is a 56 y.o. female who presents today for a complete physical exam.  She reports consuming a general diet. The patient does not participate in regular exercise at present. She just started this week to exercise.She generally feels well. She reports sleeping well.   She does have additional problems to discuss today. She states that she has excessive burping lately. She also woke up with a cough today and scratchy throat. She did two covid test and were negative.   Burping  She reports that this has been long standing issues  She notices it after garlic powder meals  She reports a feeling of her stomach filling with gas followed by continuous burping  If she eats yogurt, it will calm the burping but it does not resolve  She reports taking 2 tums at a time until the burping stops often requires 6-8 tums  These episodes occur 1-2 times per week  She has been keeping food diary  She denies chest pain or disturbance to breathing  She denies hematochezia or melena  She reports drinking seltzer 4 days per week and chewing gum once in a while  She denies drinking sodas  She occasionally has heartburn, if two days in a row of symptoms, she takes Prilosec once every few weeks She adds that her mother has the same frequency   Cough and Sore Throat  She reports she is driving to [place] to care for her mother for the next 2 weeks  She has had both covid and  influenza vaccine as of Oct  Has had sore throat for two days and dry cough started this AM  She denies fever, chills, body aches  She denies overt HA but does have some feeling "cloudiness"  She is not aware of sick contacts with similar symptoms  She denies diarrhea  No SOB or tightness in the chest  +post nasal drainage, no rhinorrhea that is significant  +congestion  She reports hx of allergies for which she takes flonase and singulair  She reports some maxillary sinus pressure that started this AM  She reports some left ear discomfort, not signifcant      Past Medical History:  Diagnosis Date   Anxiety    Arthritis    right great toe   Basal cell carcinoma 05/01/2020   Left anterior neck. Nodular. - excised 07/16/2020   GERD (gastroesophageal reflux disease)    OCC   Hypertension    Motion sickness    long car rides, amusement park rides   Seasonal allergies    Past Surgical History:  Procedure Laterality Date   COLONOSCOPY N/A 02/21/2015   Procedure: COLONOSCOPY;  Surgeon: Lucilla Lame, MD;  Location: [facility];  Service: Gastroenterology;  Laterality: N/A;   KNEE ARTHROSCOPY Left 10/04/2016   Procedure: ARTHROSCOPY KNEE;  Surgeon: Dereck Leep, MD;  Location: [facility];  Service: Orthopedics;  Laterality: Left;   KNEE ARTHROSCOPY Right 04/06/2017   Procedure: ARTHROSCOPY KNEE, PARTIAL  MEDIAL MENISCECTOMY, MEDIAL CHONDROPLASTY;  Surgeon: Dereck Leep, MD;  Location: [facility];  Service: Orthopedics;  Laterality: Right;   laser vaporization of VIN  01/09/2008   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 and Dr Hazle Nordmann   TUBAL LIGATION  2009   Social History   Socioeconomic History   Marital status: Divorced    Spouse name: Not on file   Number of children: 2   Years of education: 18   Highest education level: Not on file  Occupational History   Occupation: Investment banker, 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  Tobacco Use   Smoking status: Never   Smokeless tobacco: Never  Vaping  Use   Vaping Use: Never used  Substance and Sexual Activity   Alcohol use: Yes    Comment: 4 nights a week, wine or beer   Drug use: No   Sexual activity: Yes    Birth control/protection: Surgical  Other Topics Concern   Not on file  Social History Narrative   Not on file   Social Determinants of Health   Financial Resource Strain: Not on file  Food Insecurity: Not on file  Transportation Needs: Not on file  Physical Activity: Not on file  Stress: Not on file  Social Connections: Not on file  Intimate Partner Violence: Not on file   Family Status  Relation Name Status   Mother  Alive   Father  Deceased at age 37   Sister  35   Brother  Alive   Family History  Problem Relation Age of Onset   Hypertension Mother    Anxiety disorder Mother    Mitral valve prolapse Mother    Cancer Father        Lung and Kidney   Hypertension Father    Arrhythmia Father    Hypertension Sister    Hypertension Brother    No Known Allergies  Patient Care Team: Tally Joe T, FNP as PCP - General (Family Medicine)   Medications: Outpatient Medications Prior to Visit  Medication Sig   amLODipine (NORVASC) 10 MG tablet TAKE 1 TABLET(10 MG) BY MOUTH DAILY   Cholecalciferol (VITAMIN D) 2000 units tablet Take 1 tablet by mouth daily.   fluticasone (FLONASE) 50 MCG/ACT nasal spray SHAKE LIQUID AND USE 1 TO 2 SPRAYS IN EACH NOSTRIL DAILY   Glucosamine-Chondroitin-MSM 500-200-150 MG TABS Take 1 tablet by mouth daily.   ibuprofen (ADVIL,MOTRIN) 200 MG tablet Take 400 mg by mouth 3 (three) times daily as needed for headache or mild pain.    loratadine (CLARITIN) 10 MG tablet Take 10 mg by mouth daily as needed.   montelukast (SINGULAIR) 10 MG tablet TAKE 1 TABLET(10 MG) BY MOUTH AT BEDTIME   omeprazole (PRILOSEC) 10 MG capsule Take 1 capsule by mouth as needed.   pyridoxine (B-6) 100 MG tablet Take 100 mg by mouth daily.   sertraline (ZOLOFT) 50 MG tablet TAKE 1/2 TO 1 TABLET BY MOUTH  EVERY DAY   vitamin B-12 (CYANOCOBALAMIN) 1000 MCG tablet Take 1,000 mcg by mouth daily.   [DISCONTINUED] metoprolol succinate (TOPROL-XL) 50 MG 24 hr tablet TAKE 1 TABLET(50 MG) BY MOUTH DAILY   albuterol (VENTOLIN HFA) 108 (90 Base) MCG/ACT inhaler Inhale 2 puffs into the lungs every 6 (six) hours as needed for wheezing or shortness of breath.   No facility-administered medications prior to visit.    Review of Systems  HENT:  Positive for congestion and sore throat.   Musculoskeletal:  Positive for arthralgias.  Allergic/Immunologic: Positive for environmental allergies.  All other systems reviewed and are negative.     Objective    BP (!) 153/91 (BP Location: Left Arm, Patient Position: Sitting, Cuff Size: Large)   Pulse 97   Temp 98.7 F (37.1 C) (Oral)   Resp 16   Ht '5\' 8"'$  (1.727 m)   Wt 232 lb 12.8 oz (105.6 kg)   LMP 05/23/2018   BMI 35.40 kg/m     Physical Exam Vitals reviewed.  Constitutional:      General: She is not in acute distress.    Appearance: Normal appearance. She is not ill-appearing, toxic-appearing or diaphoretic.     Interventions: She is not intubated. HENT:     Head: Normocephalic and atraumatic.     Right Ear: Tympanic membrane and external ear normal. There is no impacted cerumen.     Left Ear: Tympanic membrane and external ear normal. There is no impacted cerumen.     Nose: Nose normal.     Mouth/Throat:     Pharynx: Oropharynx is clear.  Eyes:     General: No scleral icterus.    Extraocular Movements: Extraocular movements intact.     Conjunctiva/sclera: Conjunctivae normal.     Pupils: Pupils are equal, round, and reactive to light.  Cardiovascular:     Rate and Rhythm: Normal rate and regular rhythm.     Pulses: Normal pulses.     Heart sounds: Normal heart sounds. No murmur heard.    No friction rub. No gallop.  Pulmonary:     Effort: Pulmonary effort is normal. No tachypnea, bradypnea, accessory muscle usage, respiratory  distress or retractions. She is not intubated.     Breath sounds: No stridor. Examination of the right-middle field reveals decreased breath sounds. Examination of the left-middle field reveals decreased breath sounds. Examination of the right-lower field reveals decreased breath sounds. Examination of the left-lower field reveals decreased breath sounds. Decreased breath sounds present. No wheezing, rhonchi or rales.     Comments: Intermittent cough, nonproductive  Abdominal:     General: Bowel sounds are normal. There is no distension.     Palpations: Abdomen is soft. There is no mass.     Tenderness: There is no abdominal tenderness. There is no guarding.  Musculoskeletal:        General: No deformity.     Cervical back: Normal range of motion and neck supple. No rigidity.     Right lower leg: No edema.     Left lower leg: No edema.  Lymphadenopathy:     Cervical: No cervical adenopathy.  Skin:    General: Skin is warm.     Capillary Refill: Capillary refill takes less than 2 seconds.     Findings: No erythema or rash.  Neurological:     General: No focal deficit present.     Mental Status: She is alert and oriented to person, place, and time.     Motor: No weakness.     Gait: Gait normal.  Psychiatric:        Mood and Affect: Mood normal.        Behavior: Behavior normal.       Last depression screening scores    08/05/2022    8:17 AM 08/03/2021    9:11 AM 12/26/2020   11:11 AM  PHQ 2/9 Scores  PHQ - 2 Score 0 0 0  PHQ- 9 Score 0 2 2   Last fall risk screening    08/05/2022    8:16 AM  Fall  Risk   Falls in the past year? 0  Number falls in past yr: 0  Injury with Fall? 0  Risk for fall due to : No Fall Risks   Last Audit-C alcohol use screening    08/05/2022    8:17 AM  Alcohol Use Disorder Test (AUDIT)  1. How often do you have a drink containing alcohol? 2  2. How many drinks containing alcohol do you have on a typical day when you are drinking? 0  3. How  often do you have six or more drinks on one occasion? 0  AUDIT-C Score 2   A score of 3 or more in women, and 4 or more in men indicates increased risk for alcohol abuse, EXCEPT if all of the points are from question 1   No results found for any visits on 08/05/22.  Assessment & Plan    Routine Health Maintenance and Physical Exam  Exercise Activities and Dietary recommendations  Goals   None     Immunization History  Administered Date(s) Administered   Covid-19, Mrna,Vaccine(Spikevax)20yr and older 05/25/2022   Influenza,inj,Quad PF,6+ Mos 05/10/2013, 05/24/2015, 07/28/2017, 06/07/2018, 06/19/2019   Influenza-Unspecified 06/16/2020, 06/10/2021, 05/25/2022   PFIZER(Purple Top)SARS-COV-2 Vaccination 11/16/2019, 12/14/2019, 06/16/2020   Pfizer Covid-19 Vaccine Bivalent Booster 119yr& up 06/10/2021   Tdap 03/15/2011   Zoster Recombinat (Shingrix) 06/19/2019, 10/15/2019    Health Maintenance  Topic Date Due   DTaP/Tdap/Td (2 - Td or Tdap) 03/14/2021   COVID-19 Vaccine (6 - 2023-24 season) 07/20/2022   MAMMOGRAM  09/17/2022   PAP SMEAR-Modifier  08/03/2024   COLONOSCOPY (Pts [id number] coverage will need to be confirmed)  02/20/2025   INFLUENZA VACCINE  Completed   Hepatitis C Screening  Completed   HIV Screening  Completed   Zoster Vaccines- Shingrix  Completed   HPV VACCINES  Aged Out    Discussed health benefits of physical activity, and encouraged her to engage in regular exercise appropriate for her age and condition.  Problem List Items Addressed This Visit       Cardiovascular and Mediastinum   Essential (primary) hypertension    Blood pressure elevated today Patient has been out of metoprolol Refills provided for metoprolol 50 once daily She will continue amlodipine 10 mg Recommend blood pressure recheck after patient has been on both medications for her 1 month follow-up      Relevant Medications   metoprolol succinate (TOPROL-XL) 50 MG 24 hr  tablet   Other Relevant Orders   Comprehensive metabolic panel     Other   Avitaminosis D    We will collect vitamin D level today      Relevant Orders   Vitamin D (25 hydroxy)   Elevated LDL cholesterol level    Patient is not on statin therapy We will check lipid panel today       Relevant Orders   Lipid panel   BMI 34.0-34.9,adult    We will collect CMP, lipid panel and hemoglobin A1c today       Relevant Orders   Hemoglobin A1c   Annual physical exam - Primary    Annual exam completed today Patient commended for tetanus vaccine, declines that she is sick at this time She is up-to-date on COVID vaccination,      Burping    Excessive burping and absence of red flag symptoms including chest pain and would difficulty breathing or early satiety  Recommended daily omeprazole and follow-up in 1 month We discussed limiting chewing gum,  carbonated beverages, obvious triggers such as Garlique as mentioned above Reviewed reflux diet, see AVS for recommendations      Acute cough    Patient is at the beginning of mild upper respiratory symptoms Given physical exam with decreased breath sounds bilaterally, we will obtain chest x-ray Could also consider this to be early signs of infection however patient only had symptoms for 2 days We will test for COVID, RSV and influenza given patient is traveling to take care of her 33 year old mother during this upcoming weekend       Relevant Orders   CBC with Differential/Platelet   DG Chest 2 View   COVID-19, Flu A+B and RSV   Sore throat    Will check COVID, influenza and RSV test She has no tonsillar exudate or other exam findings to suggest strep pharyngitis Recommended oral hydration and over-the-counter medications for symptom management       Relevant Orders   COVID-19, Flu A+B and RSV     Return in about 1 month (around 09/05/2022).     I, Amanda Foster, MD, have reviewed all documentation for this  visit.  Portions of this information were initially documented by the CMA and reviewed by me for thoroughness and accuracy.    Amanda Foster, MD  [facility] [phone] (phone) [phone] (fax)  [place]

## 2022-08-05 ENCOUNTER — Ambulatory Visit (INDEPENDENT_AMBULATORY_CARE_PROVIDER_SITE_OTHER): Payer: BC Managed Care – PPO | Admitting: Family Medicine

## 2022-08-05 ENCOUNTER — Ambulatory Visit
Admission: RE | Admit: 2022-08-05 | Discharge: 2022-08-05 | Disposition: A | Discharge status: Home / Self Care | Payer: BC Managed Care – PPO | Attending: Family Medicine | Admitting: Family Medicine

## 2022-08-05 ENCOUNTER — Encounter: Payer: Self-pay | Admitting: Family Medicine

## 2022-08-05 ENCOUNTER — Ambulatory Visit
Admission: RE | Admit: 2022-08-05 | Discharge: 2022-08-05 | Disposition: A | Discharge status: Home / Self Care | Payer: BC Managed Care – PPO | Source: Ambulatory Visit | Attending: Family Medicine | Admitting: Family Medicine

## 2022-08-05 VITALS — BP 153/91 | HR 97 | Temp 98.7°F | Resp 16 | Ht 68.0 in | Wt 232.8 lb

## 2022-08-05 DIAGNOSIS — R051 Acute cough: Secondary | ICD-10-CM

## 2022-08-05 DIAGNOSIS — I10 Essential (primary) hypertension: Secondary | ICD-10-CM | POA: Diagnosis not present

## 2022-08-05 DIAGNOSIS — Z0000 Encounter for general adult medical examination without abnormal findings: Secondary | ICD-10-CM | POA: Diagnosis not present

## 2022-08-05 DIAGNOSIS — Z6834 Body mass index (BMI) 34.0-34.9, adult: Secondary | ICD-10-CM

## 2022-08-05 DIAGNOSIS — E559 Vitamin D deficiency, unspecified: Secondary | ICD-10-CM

## 2022-08-05 DIAGNOSIS — R142 Eructation: Secondary | ICD-10-CM | POA: Insufficient documentation

## 2022-08-05 DIAGNOSIS — E7800 Pure hypercholesterolemia, unspecified: Secondary | ICD-10-CM

## 2022-08-05 DIAGNOSIS — J029 Acute pharyngitis, unspecified: Secondary | ICD-10-CM | POA: Diagnosis not present

## 2022-08-05 MED ORDER — METOPROLOL SUCCINATE ER 50 MG PO TB24: ORAL_TABLET | ORAL | 0 refills | Status: DC

## 2022-08-05 NOTE — Assessment & Plan Note (Signed)
Patient is at the beginning of mild upper respiratory symptoms Given physical exam with decreased breath sounds bilaterally, we will obtain chest x-ray Could also consider this to be early signs of infection however patient only had symptoms for 2 days We will test for COVID, RSV and influenza given patient is traveling to take care of her 57 year old mother during this upcoming weekend

## 2022-08-05 NOTE — Assessment & Plan Note (Signed)
We will collect CMP, lipid panel and hemoglobin A1c today

## 2022-08-05 NOTE — Assessment & Plan Note (Signed)
Annual exam completed today Patient commended for tetanus vaccine, declines that she is sick at this time She is up-to-date on COVID vaccination,

## 2022-08-05 NOTE — Patient Instructions (Signed)
It was a pleasure to see you today!  Thank you for choosing [facility] for your primary care.   Amanda Day was seen for Physical, cough and burping.   Our plans for today were: I have included some of the dietary recommendations below for the burping symptoms. Please take the omeprazole daily for the next month and follow up for these symptoms in 1 month We will test for COVID, influenza and RSV today and notify you of results once they are available. In the meantime, please wear a mask when around others, wash hands frequently, continue to cover coughs and sneezes as well as use over the counter medications for your symptoms. I recommend warm teas with honey to help with the cough.  Please report to [facility] located at:  [place], [place]  You do not need an appointment to have xrays completed.   Our office will follow up with  results once available.    To keep you healthy, please keep in mind the following health maintenance items that you are due for:   Tetanus    You should return to our clinic in 1 month for burping.   Best Wishes,   Dr. Quentin Cornwall     Food Choices for Gastroesophageal Reflux Disease, Adult When you have gastroesophageal reflux disease (GERD), the foods you eat and your eating habits are very important. Choosing the right foods can help ease your discomfort. Think about working with a food expert (dietitian) to help you make good choices. What are tips for following this plan? Reading food labels Look for foods that are low in saturated fat. Foods that may help with your symptoms include: Foods that have less than 5% of daily value (DV) of fat. Foods that have 0 grams of trans fat. Cooking Do not fry your food. Cook your food by baking, steaming, grilling, or broiling. These are all methods that do not need a lot of fat for cooking. To add flavor, try to use herbs  that are low in spice and acidity. Meal planning  Choose healthy foods that are low in fat, such as: Fruits and vegetables. Whole grains. Low-fat dairy products. Lean meats, fish, and poultry. Eat small meals often instead of eating 3 large meals each day. Eat your meals slowly in a place where you are relaxed. Avoid bending over or lying down until 2-3 hours after eating. Limit high-fat foods such as fatty meats or fried foods. Limit your intake of fatty foods, such as oils, butter, and shortening. Avoid the following as told by your doctor: Foods that cause symptoms. These may be different for different people. Keep a food diary to keep track of foods that cause symptoms. Alcohol. Drinking a lot of liquid with meals. Eating meals during the 2-3 hours before bed. Lifestyle Stay at a healthy weight. Ask your doctor what weight is healthy for you. If you need to lose weight, work with your doctor to do so safely. Exercise for at least 30 minutes on 5 or more days each week, or as told by your doctor. Wear loose-fitting clothes. Do not smoke or use any products that contain nicotine or tobacco. If you need help quitting, ask your doctor. Sleep with the head of your bed higher than your feet. Use a wedge under the mattress or blocks under the bed frame to raise the head of the bed. Chew sugar-free gum after meals. What foods should eat?  Eat a healthy, well-balanced diet of fruits, vegetables, whole grains, low-fat dairy products, lean meats, fish, and poultry. Each person is different. Foods that may cause symptoms in one person may not cause any symptoms in another person. Work with your doctor to find foods that are safe for you. The items listed above may not be a complete list of what you can eat and drink. Contact a food expert for more options. What foods should I avoid? Limiting some of these foods may help in managing the symptoms of GERD. Everyone is different. Talk with a food  expert or your doctor to help you find the exact foods to avoid, if any. Fruits Any fruits prepared with added fat. Any fruits that cause symptoms. For some people, this may include citrus fruits, such as oranges, grapefruit, pineapple, and lemons. Vegetables Deep-fried vegetables. [place] fries. Any vegetables prepared with added fat. Any vegetables that cause symptoms. For some people, this may include tomatoes and tomato products, chili peppers, onions and garlic, and horseradish. Grains Pastries or quick breads with added fat. Meats and other proteins High-fat meats, such as fatty beef or pork, hot dogs, ribs, ham, sausage, salami, and bacon. Fried meat or protein, including fried fish and fried chicken. Nuts and nut butters, in large amounts. Dairy Whole milk and chocolate milk. Sour cream. Cream. Ice cream. Cream cheese. Milkshakes. Fats and oils Butter. Margarine. Shortening. Ghee. Beverages Coffee and tea, with or without caffeine. Carbonated beverages. Sodas. Energy drinks. Fruit juice made with acidic fruits, such as orange or grapefruit. Tomato juice. Alcoholic drinks. Sweets and desserts Chocolate and cocoa. Donuts. Seasonings and condiments Pepper. Peppermint and spearmint. Added salt. Any condiments, herbs, or seasonings that cause symptoms. For some people, this may include curry, hot sauce, or vinegar-based salad dressings. The items listed above may not be a complete list of what you should not eat and drink. Contact a food expert for more options. Questions to ask your doctor Diet and lifestyle changes are often the first steps that are taken to manage symptoms of GERD. If diet and lifestyle changes do not help, talk with your doctor about taking medicines. Where to find more information International Foundation for Gastrointestinal Disorders: aboutgerd.org Summary When you have GERD, food and lifestyle choices are very important in easing your symptoms. Eat small meals  often instead of 3 large meals a day. Eat your meals slowly and in a place where you are relaxed. Avoid bending over or lying down until 2-3 hours after eating. Limit high-fat foods such as fatty meats or fried foods. This information is not intended to replace advice given to you by your health care provider. Make sure you discuss any questions you have with your health care provider. Document Revised: 02/18/2020 Document Reviewed: 02/18/2020 Elsevier Patient Education  [place].

## 2022-08-05 NOTE — Assessment & Plan Note (Signed)
Patient is not on statin therapy We will check lipid panel today

## 2022-08-05 NOTE — Assessment & Plan Note (Signed)
We will collect vitamin D level today

## 2022-08-05 NOTE — Assessment & Plan Note (Signed)
Will check COVID, influenza and RSV test She has no tonsillar exudate or other exam findings to suggest strep pharyngitis Recommended oral hydration and over-the-counter medications for symptom management

## 2022-08-05 NOTE — Assessment & Plan Note (Signed)
Blood pressure elevated today Patient has been out of metoprolol Refills provided for metoprolol 50 once daily She will continue amlodipine 10 mg Recommend blood pressure recheck after patient has been on both medications for her 1 month follow-up

## 2022-08-05 NOTE — Assessment & Plan Note (Signed)
Excessive burping and absence of red flag symptoms including chest pain and would difficulty breathing or early satiety  Recommended daily omeprazole and follow-up in 1 month We discussed limiting chewing gum, carbonated beverages, obvious triggers such as Garlique as mentioned above Reviewed reflux diet, see AVS for recommendations

## 2022-08-06 ENCOUNTER — Encounter: Payer: Self-pay | Admitting: Family Medicine

## 2022-08-06 LAB — CBC WITH DIFFERENTIAL/PLATELET
Basophils Absolute: 0 10*3/uL (ref 0.0–0.2)
Basos: 0 %
EOS (ABSOLUTE): 0.1 10*3/uL (ref 0.0–0.4)
Eos: 1 %
Hematocrit: 39.7 % (ref 34.0–46.6)
Hemoglobin: 13.1 g/dL (ref 11.1–15.9)
Immature Grans (Abs): 0 10*3/uL (ref 0.0–0.1)
Immature Granulocytes: 0 %
Lymphocytes Absolute: 0.8 10*3/uL (ref 0.7–3.1)
Lymphs: 18 %
MCH: 29.8 pg (ref 26.6–33.0)
MCHC: 33 g/dL (ref 31.5–35.7)
MCV: 90 fL (ref 79–97)
Monocytes Absolute: 0.7 10*3/uL (ref 0.1–0.9)
Monocytes: 15 %
Neutrophils Absolute: 3.1 10*3/uL (ref 1.4–7.0)
Neutrophils: 66 %
Platelets: 252 10*3/uL (ref 150–450)
RBC: 4.39 x10E6/uL (ref 3.77–5.28)
RDW: 12.5 % (ref 11.7–15.4)
WBC: 4.7 10*3/uL (ref 3.4–10.8)

## 2022-08-06 LAB — COMPREHENSIVE METABOLIC PANEL
ALT: 29 IU/L (ref 0–32)
AST: 24 IU/L (ref 0–40)
Albumin/Globulin Ratio: 1.9 (ref 1.2–2.2)
Albumin: 4.4 g/dL (ref 3.8–4.9)
Alkaline Phosphatase: 74 IU/L (ref 44–121)
BUN/Creatinine Ratio: 17 (ref 9–23)
BUN: 16 mg/dL (ref 6–24)
Bilirubin Total: 0.4 mg/dL (ref 0.0–1.2)
CO2: 25 mmol/L (ref 20–29)
Calcium: 9.6 mg/dL (ref 8.7–10.2)
Chloride: 103 mmol/L (ref 96–106)
Creatinine, Ser: 0.92 mg/dL (ref 0.57–1.00)
Globulin, Total: 2.3 g/dL (ref 1.5–4.5)
Glucose: 95 mg/dL (ref 70–99)
Potassium: 4.3 mmol/L (ref 3.5–5.2)
Sodium: 142 mmol/L (ref 134–144)
Total Protein: 6.7 g/dL (ref 6.0–8.5)
eGFR: 73 mL/min/{1.73_m2} (ref >59)

## 2022-08-06 LAB — LIPID PANEL
Chol/HDL Ratio: 4.3 ratio (ref 0.0–4.4)
Cholesterol, Total: 222 mg/dL — ABNORMAL HIGH (ref 100–199)
HDL: 52 mg/dL (ref >39)
LDL Chol Calc (NIH): 140 mg/dL — ABNORMAL HIGH (ref 0–99)
Triglycerides: 166 mg/dL — ABNORMAL HIGH (ref 0–149)
VLDL Cholesterol Cal: 30 mg/dL (ref 5–40)

## 2022-08-06 LAB — HEMOGLOBIN A1C
Est. average glucose Bld gHb Est-mCnc: 105 mg/dL
Hgb A1c MFr Bld: 5.3 % (ref 4.8–5.6)

## 2022-08-06 LAB — VITAMIN D 25 HYDROXY (VIT D DEFICIENCY, FRACTURES): Vit D, 25-Hydroxy: 54.8 ng/mL (ref 30.0–100.0)

## 2022-08-07 ENCOUNTER — Other Ambulatory Visit: Payer: Self-pay | Admitting: Family Medicine

## 2022-08-07 DIAGNOSIS — I10 Essential (primary) hypertension: Secondary | ICD-10-CM

## 2022-08-08 LAB — COVID-19, FLU A+B AND RSV
Influenza A, NAA: DETECTED — AB
Influenza B, NAA: NOT DETECTED
RSV, NAA: NOT DETECTED
SARS-CoV-2, NAA: NOT DETECTED

## 2022-08-09 NOTE — Telephone Encounter (Signed)
Requested Prescriptions  Pending Prescriptions Disp Refills   amLODipine (NORVASC) 10 MG tablet [Pharmacy Med Name: AMLODIPINE BESYLATE '10MG'$  TABLETS] 90 tablet 1    Sig: TAKE 1 TABLET(10 MG) BY MOUTH DAILY     Cardiovascular: Calcium Channel Blockers 2 Failed - 08/07/2022  8:59 AM      Failed - Last BP in normal range    BP Readings from Last 1 Encounters:  08/05/22 (!) 153/91         Passed - Last Heart Rate in normal range    Pulse Readings from Last 1 Encounters:  08/05/22 97         Passed - Valid encounter within last 6 months    Recent Outpatient Visits           4 days ago Annual physical exam   [place], Riki Sheer, MD   1 year ago Annual physical exam   [facility] Gwyneth Sprout, FNP   1 year ago Bronchitis due to COVID-19 virus   [facility], Dionne Bucy, MD   2 years ago Salivary gland enlargement   [place], Clearnce Sorrel, [place]   2 years ago Annual physical exam   [facility] [place], [place], [place]

## 2022-08-18 ENCOUNTER — Encounter: Payer: Self-pay | Admitting: *Deleted

## 2022-09-09 ENCOUNTER — Other Ambulatory Visit: Payer: Self-pay | Admitting: Family Medicine

## 2022-09-09 DIAGNOSIS — F325 Major depressive disorder, single episode, in full remission: Secondary | ICD-10-CM

## 2022-09-09 DIAGNOSIS — J301 Allergic rhinitis due to pollen: Secondary | ICD-10-CM

## 2022-09-09 DIAGNOSIS — I10 Essential (primary) hypertension: Secondary | ICD-10-CM

## 2022-09-09 NOTE — Telephone Encounter (Signed)
Requested Prescriptions  Pending Prescriptions Disp Refills   montelukast (SINGULAIR) 10 MG tablet [Pharmacy Med Name: MONTELUKAST '10MG'$  TABLETS] 90 tablet     Sig: TAKE 1 TABLET(10 MG) BY MOUTH AT BEDTIME     Pulmonology:  Leukotriene Inhibitors Passed - 09/09/2022  2:25 PM      Passed - Valid encounter within last 12 months    Recent Outpatient Visits           1 month ago Annual physical exam   [place], Financial risk analyst, MD   1 year ago Annual physical exam   [facility] Tally Joe T, FNP   1 year ago Bronchitis due to COVID-19 virus   [facility], Dionne Bucy, MD   2 years ago Salivary gland enlargement   [facility] [place] M[place] M, [place]   2 years ago Annual physical exam   [facility] [place] M, PA-C               sertraline (ZOLOFT) 50 MG tablet [Pharmacy Med Name: SERTRALINE '50MG'$  TABLETS] 90 tablet 3    Sig: TAKE 1/2 TO 1 TABLET BY MOUTH EVERY DAY     Psychiatry:  Antidepressants - SSRI - sertraline Passed - 09/09/2022  2:25 PM      Passed - AST in normal range and within 360 days    AST  Date Value Ref Range Status  08/05/2022 24 0 - 40 IU/L Final         Passed - ALT in normal range and within 360 days    ALT  Date Value Ref Range Status  08/05/2022 29 0 - 32 IU/L Final         Passed - Completed PHQ-2 or PHQ-9 in the last 360 days      Passed - Valid encounter within last 6 months    Recent Outpatient Visits           1 month ago Annual physical exam   [facility] Simmons-Robinson, Riki Sheer, MD   1 year ago Annual physical exam   [facility] Gwyneth Sprout, FNP   1 year ago Bronchitis due to COVID-19 virus   [facility], Dionne Bucy, MD   2 years ago Salivary gland enlargement   [place], Clearnce Sorrel, [place]   2 years ago Annual physical exam   [facility] [place], [place], [place]

## 2022-09-30 ENCOUNTER — Other Ambulatory Visit: Payer: Self-pay | Admitting: Family Medicine

## 2022-09-30 DIAGNOSIS — I10 Essential (primary) hypertension: Secondary | ICD-10-CM

## 2022-09-30 MED ORDER — METOPROLOL SUCCINATE ER 100 MG PO TB24: 100.0000 mg | ORAL_TABLET | Freq: Every day | ORAL | 3 refills | Status: DC

## 2022-11-07 ENCOUNTER — Other Ambulatory Visit: Payer: Self-pay | Admitting: Family Medicine

## 2022-11-07 DIAGNOSIS — J301 Allergic rhinitis due to pollen: Secondary | ICD-10-CM

## 2022-11-10 ENCOUNTER — Other Ambulatory Visit: Payer: Self-pay | Admitting: Family Medicine

## 2022-11-10 DIAGNOSIS — I10 Essential (primary) hypertension: Secondary | ICD-10-CM

## 2022-11-11 NOTE — Telephone Encounter (Signed)
Rx  08/09/22 #90 1RF- too soon Requested Prescriptions  Pending Prescriptions Disp Refills   amLODipine (NORVASC) 10 MG tablet [Pharmacy Med Name: AMLODIPINE BESYLATE 10MG  TABLETS] 90 tablet 1    Sig: TAKE 1 TABLET(10 MG) BY MOUTH DAILY     Cardiovascular: Calcium Channel Blockers 2 Failed - 11/10/2022  5:05 PM      Failed - Last BP in normal range    BP Readings from Last 1 Encounters:  08/05/22 (!) 153/91         Passed - Last Heart Rate in normal range    Pulse Readings from Last 1 Encounters:  08/05/22 97         Passed - Valid encounter within last 6 months    Recent Outpatient Visits           3 months ago Annual physical exam   [place] Simmons-Robinson, Riki Sheer, MD   1 year ago Annual physical exam   [place] Tally Joe T, [place]   1 year ago Bronchitis due to COVID-19 virus   [facility], Dionne Bucy, MD   2 years ago Salivary gland enlargement   [facility] [place], Clearnce Sorrel, [place]   2 years ago Annual physical exam   [facility] [place], [place]

## 2022-11-11 NOTE — Telephone Encounter (Signed)
Pt called reporting that she was given a short supply at a new [employer], and that she never received the refill in the order because the bottle said no refills. She is completely out of her current supply.

## 2022-11-16 ENCOUNTER — Other Ambulatory Visit: Payer: Self-pay | Admitting: Family Medicine

## 2022-11-16 DIAGNOSIS — I10 Essential (primary) hypertension: Secondary | ICD-10-CM

## 2022-11-16 MED ORDER — AMLODIPINE BESYLATE 10 MG PO TABS: 10.0000 mg | ORAL_TABLET | Freq: Every day | ORAL | 3 refills | Status: DC

## 2022-11-16 NOTE — Telephone Encounter (Signed)
Pt is calling back to check on the status of her medication refill. Pt states that she has been without her medication for 1 week and is needing her medication. Pt states that she never received a refill in the order. Please advise.

## 2022-12-15 ENCOUNTER — Other Ambulatory Visit: Payer: Self-pay | Admitting: Family Medicine

## 2022-12-15 DIAGNOSIS — J301 Allergic rhinitis due to pollen: Secondary | ICD-10-CM

## 2022-12-15 NOTE — Telephone Encounter (Signed)
Requested Prescriptions  Pending Prescriptions Disp Refills   montelukast (SINGULAIR) 10 MG tablet [Pharmacy Med Name: MONTELUKAST  TABLETS] 90 tablet 3    Sig: TAKE 1 TABLET(10 MG) BY MOUTH AT BEDTIME     Pulmonology:  Leukotriene Inhibitors Passed - 12/15/2022  3:35 AM      Passed - Valid encounter within last 12 months    Recent Outpatient Visits           4 months ago Annual physical exam   [facility] Ronnald Ramp, MD   1 year ago Annual physical exam   [place] [facility] Merita Norton T, FNP   1 year ago Bronchitis due to COVID-19 virus   [facility], Marzella Schlein, MD   2 years ago Salivary gland enlargement   [facility] [place], Alessandra [place]vel[place], [place]   2 years ago Annual physical exam   [facility] Joycelyn Man [place], [place]

## 2023-01-07 ENCOUNTER — Other Ambulatory Visit: Payer: Self-pay | Admitting: Family Medicine

## 2023-01-07 DIAGNOSIS — F325 Major depressive disorder, single episode, in full remission: Secondary | ICD-10-CM

## 2023-01-07 NOTE — Telephone Encounter (Signed)
Requested Prescriptions  Pending Prescriptions Disp Refills   sertraline (ZOLOFT) 50 MG tablet [Pharmacy Med Name: SERTRALINE 50MG  TABLETS] 90 tablet 0    Sig: TAKE 1/2 TO 1 TABLET BY MOUTH EVERY DAY     Psychiatry:  Antidepressants - SSRI - sertraline Passed - 01/07/2023  8:17 AM      Passed - AST in normal range and within 360 days    AST  Date Value Ref Range Status  08/05/2022 24 0 - 40 IU/L Final         Passed - ALT in normal range and within 360 days    ALT  Date Value Ref Range Status  08/05/2022 29 0 - 32 IU/L Final         Passed - Completed PHQ-2 or PHQ-9 in the last 360 days      Passed - Valid encounter within last 6 months    Recent Outpatient Visits           5 months ago Annual physical exam   [place] [facility] Simmons-Robinson, Tawanna Cooler, MD   1 year ago Annual physical exam   [place] Merita Norton T, FNP   2 years ago Bronchitis due to COVID-19 virus   [facility] [place], Marzella Schlein, MD   2 years ago Salivary gland enlargement   [facility] [place], Alessandra [place], [place]   2 years ago Annual physical exam   [facility] [place], [place], [place]

## 2023-04-13 ENCOUNTER — Encounter: Payer: Self-pay | Admitting: Family Medicine

## 2023-04-13 ENCOUNTER — Ambulatory Visit: Payer: BC Managed Care – PPO | Admitting: Family Medicine

## 2023-04-13 VITALS — BP 134/85 | HR 69 | Temp 98.3°F | Resp 12 | Ht 68.0 in | Wt 228.5 lb

## 2023-04-13 DIAGNOSIS — M25561 Pain in right knee: Secondary | ICD-10-CM | POA: Diagnosis not present

## 2023-04-13 DIAGNOSIS — G8929 Other chronic pain: Secondary | ICD-10-CM | POA: Insufficient documentation

## 2023-04-13 DIAGNOSIS — Z791 Long term (current) use of non-steroidal anti-inflammatories (NSAID): Secondary | ICD-10-CM | POA: Insufficient documentation

## 2023-04-13 DIAGNOSIS — M25562 Pain in left knee: Secondary | ICD-10-CM

## 2023-04-13 MED ORDER — DICLOFENAC SODIUM 75 MG PO TBEC: 75.0000 mg | DELAYED_RELEASE_TABLET | Freq: Two times a day (BID) | ORAL | 0 refills | Status: DC

## 2023-04-13 NOTE — Assessment & Plan Note (Signed)
Chronic intermittent use for knee pain  BMP ordered today

## 2023-04-13 NOTE — Assessment & Plan Note (Signed)
Chronic bilateral knee pain with history of two tears six months apart. Pain is around the knee and down through the support structures, not within the knee joint. Pain is exacerbated by long periods of sitting (e.g., long car rides) and is currently interfering with exercise and daily activities. Patient has tried physical therapy with short-term relief and inconsistent NSAID use. Patient has tried diclofenac with significant relief. -Start Diclofenac 75mg  BID for knee pain. -Order BMP to monitor kidney function due to NSAID use. -Consider referral to orthopedics for further evaluation and management if pain persists or worsens.

## 2023-04-13 NOTE — Progress Notes (Signed)
Established patient visit   Patient: Amanda Day   DOB: [birth year]   57 y.o. Female  MRN: [id number] Visit Date: 04/13/2023  Today's healthcare provider: Ronnald Ramp, MD   Chief Complaint  Patient presents with   Knee Pain    Patient C/O recurrent knee pain, did PT all last year and stopped going in January. Hx of meniscus repair on both knees. She is requesting pain medication to take while on a trip. She reports pain is impacting her exercise.    Subjective     HPI     Knee Pain    Additional comments: Patient C/O recurrent knee pain, did PT all last year and stopped going in January. Hx of meniscus repair on both knees. She is requesting pain medication to take while on a trip. She reports pain is impacting her exercise.       Last edited by Myles Lipps, CMA on 04/13/2023  3:23 PM.       Discussed the use of AI scribe software for clinical note transcription with the patient, who gave verbal consent to proceed.  History of Present Illness   Amanda Day, a patient with a history of bilateral knee tears six months apart, presents with persistent knee pain. Despite undergoing physical therapy, the patient reports that the pain has never fully resolved. The pain is described as being located around the knee and extending downwards, suggesting involvement of the supporting structures rather than the knee joint itself. The patient reports that prolonged periods of sitting, such as during long car drives, exacerbate the pain to the point of causing sleep disturbances. The patient has been self-managing the pain with irregular use of ibuprofen, but admits to being inconsistent with the dosing schedule.  Recently, the patient's knee pain has been interfering with her ability to exercise and has been causing concern about an upcoming trip to [place] with her elderly mother. The patient has tried diclofenac, borrowed from a friend, and found it to be  significantly beneficial in managing the pain.  The patient has also been experiencing swelling in the ankles, which she manages with athletic tape. The patient reports that the knees often feel unsteady and occasionally give way when standing up. The patient has previously tried intra-articular injections for the knee pain, but found them to be ineffective. The patient acknowledges the need for weight loss and is making efforts towards this goal.         Medications: Outpatient Medications Prior to Visit  Medication Sig   amLODipine (NORVASC) 10 MG tablet Take 1 tablet (10 mg total) by mouth daily.   Cholecalciferol (VITAMIN D) 2000 units tablet Take 1 tablet by mouth daily.   fluticasone (FLONASE) 50 MCG/ACT nasal spray SHAKE LIQUID AND USE 1 TO 2 SPRAYS IN EACH NOSTRIL DAILY   Glucosamine-Chondroitin-MSM 500-200-150 MG TABS Take 1 tablet by mouth daily.   ibuprofen (ADVIL,MOTRIN) 200 MG tablet Take 400 mg by mouth 3 (three) times daily as needed for headache or mild pain.    metoprolol succinate (TOPROL-XL) 100 MG 24 hr tablet Take 1 tablet (100 mg total) by mouth daily. Take with or immediately following a meal.   montelukast (SINGULAIR) 10 MG tablet TAKE 1 TABLET(10 MG) BY MOUTH AT BEDTIME   omeprazole (PRILOSEC) 10 MG capsule Take 1 capsule by mouth as needed.   pyridoxine (B-6) 100 MG tablet Take 100 mg by mouth daily.   sertraline (ZOLOFT) 50 MG tablet TAKE 1/2 TO  1 TABLET BY MOUTH EVERY DAY   vitamin B-12 (CYANOCOBALAMIN) 1000 MCG tablet Take 1,000 mcg by mouth daily.   [DISCONTINUED] loratadine (CLARITIN) 10 MG tablet Take 10 mg by mouth daily as needed.   No facility-administered medications prior to visit.    Review of Systems  Last metabolic panel Lab Results  Component Value Date   GLUCOSE 95 08/05/2022   NA 142 08/05/2022   K 4.3 08/05/2022   CL 103 08/05/2022   CO2 25 08/05/2022   BUN 16 08/05/2022   CREATININE 0.92 08/05/2022   EGFR 73 08/05/2022   CALCIUM 9.6  08/05/2022   PROT 6.7 08/05/2022   ALBUMIN 4.4 08/05/2022   LABGLOB 2.3 08/05/2022   AGRATIO 1.9 08/05/2022   BILITOT 0.4 08/05/2022   ALKPHOS 74 08/05/2022   AST 24 08/05/2022   ALT 29 08/05/2022        Objective    BP 134/85 (BP Location: Right Arm, Patient Position: Sitting, Cuff Size: Normal)   Pulse 69   Temp 98.3 F (36.8 C) (Temporal)   Resp 12   Ht 5\' 8"  (1.727 m)   Wt 228 lb 8 oz (103.6 kg)   LMP 05/23/2018   SpO2 97%   BMI 34.74 kg/m     Physical Exam  Knee: - Inspection: no gross deformity. +swelling to medial aspect of left knee, no effusion, no erythema or bruising. Skin intact - Palpation: + TTP - ROM: full active ROM with flexion and extension in knee and hip - Strength: 5/5 strength - Neuro/vasc: NV intact   No results found for any visits on 04/13/23.   Assessment & Plan     Problem List Items Addressed This Visit     Bilateral chronic knee pain - Primary    Chronic bilateral knee pain with history of two tears six months apart. Pain is around the knee and down through the support structures, not within the knee joint. Pain is exacerbated by long periods of sitting (e.g., long car rides) and is currently interfering with exercise and daily activities. Patient has tried physical therapy with short-term relief and inconsistent NSAID use. Patient has tried diclofenac with significant relief. -Start Diclofenac 75mg  BID for knee pain. -Order BMP to monitor kidney function due to NSAID use. -Consider referral to orthopedics for further evaluation and management if pain persists or worsens.      Relevant Medications   diclofenac (VOLTAREN) 75 MG EC tablet   Other Relevant Orders   BMP8+EGFR   Encounter for long-term (current) use of NSAIDs    Chronic intermittent use for knee pain  BMP ordered today       Relevant Orders   BMP8+EGFR         General Health Maintenance -Schedule annual physical exam in December. -Encourage continued weight  loss efforts to potentially alleviate knee pain.         Return in about 4 months (around 08/13/2023) for CPE.         Ronnald Ramp, MD  [facility] [phone] (phone) [phone] (fax)  [facility]

## 2023-04-13 NOTE — Patient Instructions (Signed)
VISIT SUMMARY:  Dear Ms. Tufte, during your visit, we discussed your ongoing knee pain and swelling in your ankles. Despite physical therapy and intermittent use of ibuprofen, your pain has not fully resolved and is affecting your daily activities and sleep. You mentioned that diclofenac, which you borrowed from a friend, significantly helped manage your pain. We also discussed your efforts towards weight loss.  YOUR PLAN:  -KNEE PAIN: Your knee pain is chronic and seems to be coming from the structures around your knee, not the knee joint itself. We will start you on a medication called Diclofenac, which you found helpful before. This medication should help manage your pain. If your pain continues or gets worse, we may refer you to a specialist in bone and joint disorders (orthopedics) for further evaluation and treatment.  -GENERAL HEALTH MAINTENANCE: We will schedule your annual physical exam in December. It's important to continue your efforts to lose weight, as this could help alleviate your knee pain.  INSTRUCTIONS:  We will start you on Diclofenac 75mg  twice a day for your knee pain. We will also order a basic metabolic panel (BMP), a blood test to check your kidney function, as the medication can affect your kidneys. Please continue your efforts towards weight loss, as this can help reduce your knee pain. If your pain continues or worsens, we may refer you to a specialist for further evaluation and treatment. Please schedule your annual physical exam in December.

## 2023-04-14 LAB — BMP8+EGFR
BUN/Creatinine Ratio: 19 (ref 9–23)
BUN: 17 mg/dL (ref 6–24)
CO2: 23 mmol/L (ref 20–29)
Calcium: 9.7 mg/dL (ref 8.7–10.2)
Chloride: 105 mmol/L (ref 96–106)
Creatinine, Ser: 0.91 mg/dL (ref 0.57–1.00)
Glucose: 96 mg/dL (ref 70–99)
Potassium: 4.4 mmol/L (ref 3.5–5.2)
Sodium: 144 mmol/L (ref 134–144)
eGFR: 73 mL/min/{1.73_m2} (ref >59)

## 2023-04-19 ENCOUNTER — Other Ambulatory Visit: Payer: Self-pay | Admitting: Family Medicine

## 2023-04-19 DIAGNOSIS — F325 Major depressive disorder, single episode, in full remission: Secondary | ICD-10-CM

## 2023-04-21 NOTE — Telephone Encounter (Signed)
Requested Prescriptions  Pending Prescriptions Disp Refills   sertraline (ZOLOFT) 50 MG tablet [Pharmacy Med Name: SERTRALINE 50MG  TABLETS] 90 tablet 0    Sig: TAKE 1/2 TO 1 TABLET BY MOUTH EVERY DAY     Psychiatry:  Antidepressants - SSRI - sertraline Passed - 04/19/2023  7:56 PM      Passed - AST in normal range and within 360 days    AST  Date Value Ref Range Status  08/05/2022 24 0 - 40 IU/L Final         Passed - ALT in normal range and within 360 days    ALT  Date Value Ref Range Status  08/05/2022 29 0 - 32 IU/L Final         Passed - Completed PHQ-2 or PHQ-9 in the last 360 days      Passed - Valid encounter within last 6 months    Recent Outpatient Visits           1 week ago Bilateral chronic knee pain   [place] [facility] Simmons-Robinson, [place], MD   8 months ago Annual physical exam   [facility] [place], [place], MD   1 year ago Annual physical exam   [place] [facility] Merita Norton T, FNP   2 years ago Bronchitis due to COVID-19 virus   [facility] [place], Marzella Schlein, MD   2 years ago Salivary gland enlargement   [facility] [place], Aless[place], [place]       Future Appointments             In 3 months Simmons-Robinson, Tawanna Cooler, MD [facility]

## 2023-05-19 ENCOUNTER — Encounter: Payer: BC Managed Care – PPO | Admitting: Dermatology

## 2023-05-26 ENCOUNTER — Ambulatory Visit: Payer: BC Managed Care – PPO | Admitting: Dermatology

## 2023-05-26 ENCOUNTER — Encounter: Payer: Self-pay | Admitting: Dermatology

## 2023-05-26 VITALS — BP 117/73 | HR 77

## 2023-05-26 DIAGNOSIS — L820 Inflamed seborrheic keratosis: Secondary | ICD-10-CM | POA: Diagnosis not present

## 2023-05-26 DIAGNOSIS — L570 Actinic keratosis: Secondary | ICD-10-CM | POA: Diagnosis not present

## 2023-05-26 DIAGNOSIS — Z85828 Personal history of other malignant neoplasm of skin: Secondary | ICD-10-CM

## 2023-05-26 DIAGNOSIS — Z1283 Encounter for screening for malignant neoplasm of skin: Secondary | ICD-10-CM | POA: Diagnosis not present

## 2023-05-26 DIAGNOSIS — D229 Melanocytic nevi, unspecified: Secondary | ICD-10-CM

## 2023-05-26 DIAGNOSIS — L814 Other melanin hyperpigmentation: Secondary | ICD-10-CM | POA: Diagnosis not present

## 2023-05-26 DIAGNOSIS — D1801 Hemangioma of skin and subcutaneous tissue: Secondary | ICD-10-CM

## 2023-05-26 DIAGNOSIS — L821 Other seborrheic keratosis: Secondary | ICD-10-CM

## 2023-05-26 DIAGNOSIS — Z872 Personal history of diseases of the skin and subcutaneous tissue: Secondary | ICD-10-CM

## 2023-05-26 DIAGNOSIS — L578 Other skin changes due to chronic exposure to nonionizing radiation: Secondary | ICD-10-CM

## 2023-05-26 DIAGNOSIS — W908XXA Exposure to other nonionizing radiation, initial encounter: Secondary | ICD-10-CM

## 2023-05-26 NOTE — Progress Notes (Signed)
Follow-Up Visit   Subjective  Amanda Day is a 57 y.o. female who presents for the following: Skin Cancer Screening and Full Body Skin Exam hx of bcc, hx of aks at left forehead, hx of isk at right upper back   Reports a little itch at forehead.    The patient presents for Total-Body Skin Exam (TBSE) for skin cancer screening and mole check. The patient has spots, moles and lesions to be evaluated, some may be new or changing and the patient may have concern these could be cancer.   The following portions of the chart were reviewed this encounter and updated as appropriate: medications, allergies, medical history  Review of Systems:  No other skin or systemic complaints except as noted in HPI or Assessment and Plan.  Objective  Well appearing patient in no apparent distress; mood and affect are within normal limits.  A full examination was performed including scalp, head, eyes, ears, nose, lips, neck, chest, axillae, abdomen, back, buttocks, bilateral upper extremities, bilateral lower extremities, hands, feet, fingers, toes, fingernails, and toenails. All findings within normal limits unless otherwise noted below.   Relevant physical exam findings are noted in the Assessment and Plan.  left forehead, right forehead, central chest Erythematous thin papules/macules with gritty scale.   Right Upper Back Erythematous stuck-on, waxy papule or plaque    Assessment & Plan   SKIN CANCER SCREENING PERFORMED TODAY.  Congenital vascular lesion right posterior thigh Exam: red vascular patch   Treatment Plan: Benign. Observe   ACTINIC DAMAGE - Chronic condition, secondary to cumulative UV/sun exposure - diffuse scaly erythematous macules with underlying dyspigmentation - Recommend daily broad spectrum sunscreen SPF 30+ to sun-exposed areas, reapply every 2 hours as needed.  - Staying in the shade or wearing long sleeves, sun glasses (UVA+UVB protection) and wide brim hats  (4-inch brim around the entire circumference of the hat) are also recommended for sun protection.  - Call for new or changing lesions.  LENTIGINES, SEBORRHEIC KERATOSES, HEMANGIOMAS - Benign normal skin lesions - Benign-appearing - Call for any changes  MELANOCYTIC NEVI - Tan-brown and/or pink-flesh-colored symmetric macules and papules - Benign appearing on exam today - Observation - Call clinic for new or changing moles - Recommend daily use of broad spectrum spf 30+ sunscreen to sun-exposed areas.    HISTORY OF BASAL CELL CARCINOMA OF THE SKIN Left anterior neck 04/2020 - No evidence of recurrence today - Recommend regular full body skin exams - Recommend daily broad spectrum sunscreen SPF 30+ to sun-exposed areas, reapply every 2 hours as needed.  - Call if any new or changing lesions are noted between office visits   Actinic keratosis left forehead, right forehead, central chest  Patient deferred treatment today.   Will continue to monitor , recheck at next follow up, patient advised to call if notices any changes.   Actinic keratoses are precancerous spots that appear secondary to cumulative UV radiation exposure/sun exposure over time. They are chronic with expected duration over 1 year. A portion of actinic keratoses will progress to squamous cell carcinoma of the skin. It is not possible to reliably predict which spots will progress to skin cancer and so treatment is recommended to prevent development of skin cancer.  Recommend daily broad spectrum sunscreen SPF 30+ to sun-exposed areas, reapply every 2 hours as needed.  Recommend staying in the shade or wearing long sleeves, sun glasses (UVA+UVB protection) and wide brim hats (4-inch brim around the entire circumference of the  hat). Call for new or changing lesions.  Inflamed seborrheic keratosis Right Upper Back  Patient deferred treatment today  Multiple benign nevi  Lentigines  Seborrheic keratoses  Cherry  angioma  Actinic elastosis   Return in about 1 year (around 05/25/2024) for TBSE.  I, Asher Muir, CMA, am acting as scribe for Elie Goody, MD.   Documentation: I have reviewed the above documentation for accuracy and completeness, and I agree with the above.  Elie Goody, MD

## 2023-05-26 NOTE — Patient Instructions (Addendum)
Actinic keratoses are precancerous spots that appear secondary to cumulative UV radiation exposure/sun exposure over time. They are chronic with expected duration over 1 year. A portion of actinic keratoses will progress to squamous cell carcinoma of the skin. It is not possible to reliably predict which spots will progress to skin cancer and so treatment is recommended to prevent development of skin cancer.  Recommend daily broad spectrum sunscreen SPF 30+ to sun-exposed areas, reapply every 2 hours as needed.  Recommend staying in the shade or wearing long sleeves, sun glasses (UVA+UVB protection) and wide brim hats (4-inch brim around the entire circumference of the hat). Call for new or changing lesions.    Melanoma ABCDEs  Melanoma is the most dangerous type of skin cancer, and is the leading cause of death from skin disease.  You are more likely to develop melanoma if you: Have light-colored skin, light-colored eyes, or red or blond hair Spend a lot of time in the sun Tan regularly, either outdoors or in a tanning bed Have had blistering sunburns, especially during childhood Have a close family member who has had a melanoma Have atypical moles or large birthmarks  Early detection of melanoma is key since treatment is typically straightforward and cure rates are extremely high if we catch it early.   The first sign of melanoma is often a change in a mole or a new dark spot.  The ABCDE system is a way of remembering the signs of melanoma.  A for asymmetry:  The two halves do not match. B for border:  The edges of the growth are irregular. C for color:  A mixture of colors are present instead of an even brown color. D for diameter:  Melanomas are usually (but not always) greater than 6mm - the size of a pencil eraser. E for evolution:  The spot keeps changing in size, shape, and color.  Please check your skin once per month between visits. You can use a small mirror in front and a large  mirror behind you to keep an eye on the back side or your body.   If you see any new or changing lesions before your next follow-up, please call to schedule a visit.  Please continue daily skin protection including broad spectrum sunscreen SPF 30+ to sun-exposed areas, reapplying every 2 hours as needed when you're outdoors.   Staying in the shade or wearing long sleeves, sun glasses (UVA+UVB protection) and wide brim hats (4-inch brim around the entire circumference of the hat) are also recommended for sun protection.    Due to recent changes in healthcare laws, you may see results of your pathology and/or laboratory studies on MyChart before the doctors have had a chance to review them. We understand that in some cases there may be results that are confusing or concerning to you. Please understand that not all results are received at the same time and often the doctors may need to interpret multiple results in order to provide you with the best plan of care or course of treatment. Therefore, we ask that you please give [place] 2 business days to thoroughly review all your results before contacting the office for clarification. Should we see a critical lab result, you will be contacted sooner.   If You Need Anything After Your Visit  If you have any questions or concerns for your doctor, please call our main line at [phone] and press option 4 to reach your doctor's medical assistant. If no one answers,  please leave a voicemail as directed and we will return your call as soon as possible. Messages left after 4 pm will be answered the following business day.   You may also send [place] a message via MyChart. We typically respond to MyChart messages within 1-2 business days.  For prescription refills, please ask your pharmacy to contact our office. Our fax number is [phone].  If you have an urgent issue when the clinic is closed that cannot wait until the next business day, you can page your doctor  at the number below.    Please note that while we do our best to be available for urgent issues outside of office hours, we are not available 24/7.   If you have an urgent issue and are unable to reach [place], you may choose to seek medical care at your doctor's office, retail clinic, urgent care center, or emergency room.  If you have a medical emergency, please immediately call 911 or go to the emergency department.  Pager Numbers  - Dr. Gwen Pounds: [phone]  - Dr. Roseanne Reno: [phone]  - Dr. Katrinka Blazing: [phone]   In the event of inclement weather, please call our main line at [phone] for an update on the status of any delays or closures.  Dermatology Medication Tips: Please keep the boxes that topical medications come in in order to help keep track of the instructions about where and how to use these. Pharmacies typically print the medication instructions only on the boxes and not directly on the medication tubes.   If your medication is too expensive, please contact our office at [phone] option 4 or send [place] a message through MyChart.   We are unable to tell what your co-pay for medications will be in advance as this is different depending on your insurance coverage. However, we may be able to find a substitute medication at lower cost or fill out paperwork to get insurance to cover a needed medication.   If a prior authorization is required to get your medication covered by your insurance company, please allow [place] 1-2 business days to complete this process.  Drug prices often vary depending on where the prescription is filled and some pharmacies may offer cheaper prices.  The website www.goodrx.com contains coupons for medications through different pharmacies. The prices here do not account for what the cost may be with help from insurance (it may be cheaper with your insurance), but the website can give you the price if you did not use any insurance.  - You can print the  associated coupon and take it with your prescription to the pharmacy.  - You may also stop by our office during regular business hours and pick up a GoodRx coupon card.  - If you need your prescription sent electronically to a different pharmacy, notify our office through [facility] or by phone at [phone] option 4.     Si Usted Necesita Algo Despus de Su Visita  Tambin puede enviarnos un mensaje a travs de Clinical cytogeneticist. Por lo general respondemos a los mensajes de MyChart en el transcurso de 1 a 2 das hbiles.  Para renovar recetas, por favor pida a su farmacia que se ponga en contacto con nuestra oficina. Annie Sable de fax es [place] [phone].  Si tiene un asunto urgente cuando la clnica est cerrada y que no puede esperar hasta el siguiente da hbil, puede llamar/localizar a su doctor(a) al nmero que aparece a continuacin.   Por favor, tenga en cuenta que  aunque hacemos todo lo posible para estar disponibles para asuntos urgentes fuera del horario de oficina, no estamos disponibles las 24 horas del da, los 7 [address]  [address] de la [place].   Si tiene un problema urgente y no puede comunicarse con nosotros, puede optar por buscar atencin mdica  en el consultorio de su doctor(a), en una clnica privada, en un centro de atencin urgente o en una sala de emergencias.  Si tiene Engineer, drilling, por favor llame inmediatamente al 911 o vaya a la sala de emergencias.  Nmeros de bper  - Dr. Gwen Pounds: [phone]  - Dra. Roseanne Reno: [phone]  - Dr. Katrinka Blazing: [phone]   En caso de inclemencias del tiempo, por favor llame a Lacy Duverney principal al [phone] para una actualizacin sobre el [place] de cualquier retraso o cierre.  Consejos para la medicacin en dermatologa: Por favor, guarde las cajas en las que vienen los medicamentos de uso tpico para ayudarle a seguir las instrucciones sobre dnde y cmo usarlos. Las farmacias generalmente imprimen las instrucciones  del medicamento slo en las cajas y no directamente en los tubos del [place].   Si su medicamento es muy caro, por favor, pngase en contacto con Rolm Gala llamando al [phone] y presione la opcin 4 o envenos un mensaje a travs de Clinical cytogeneticist.   No podemos decirle cul ser su copago por los medicamentos por adelantado ya que esto es diferente dependiendo de la cobertura de su seguro. Sin embargo, es posible que podamos encontrar un medicamento sustituto a Audiological scientist un formulario para que el seguro cubra el medicamento que se considera necesario.   Si se requiere una autorizacin previa para que su compaa de seguros [place] su medicamento, por favor permtanos de 1 a 2 das hbiles para completar [address]  Los precios de los medicamentos varan con frecuencia dependiendo del Environmental consultant de dnde se surte la receta y alguna farmacias pueden ofrecer precios ms baratos.  El sitio web www.goodrx.com tiene cupones para medicamentos de Health and safety inspector. Los precios aqu no tienen en cuenta lo que podra costar con la ayuda del seguro (puede ser ms barato con su seguro), pero el sitio web puede darle el precio si no utiliz Tourist information centre manager.  - Puede imprimir el cupn correspondiente y llevarlo con su receta a la farmacia.  - Tambin puede pasar por nuestra oficina durante el horario de atencin regular y Education officer, museum una tarjeta de cupones de GoodRx.  - Si necesita que su receta se enve electrnicamente a una farmacia diferente, informe a nuestra oficina a travs de MyChart de [place] o por telfono llamando al [phone] y presione la opcin 4.

## 2023-07-15 ENCOUNTER — Other Ambulatory Visit: Payer: Self-pay | Admitting: Family Medicine

## 2023-07-15 DIAGNOSIS — J301 Allergic rhinitis due to pollen: Secondary | ICD-10-CM

## 2023-07-15 NOTE — Telephone Encounter (Signed)
Please advise 

## 2023-07-17 ENCOUNTER — Encounter: Payer: Self-pay | Admitting: Family Medicine

## 2023-07-17 DIAGNOSIS — G8929 Other chronic pain: Secondary | ICD-10-CM

## 2023-07-18 MED ORDER — DICLOFENAC SODIUM 75 MG PO TBEC: 75.0000 mg | DELAYED_RELEASE_TABLET | Freq: Two times a day (BID) | ORAL | 0 refills | Status: DC

## 2023-08-08 ENCOUNTER — Other Ambulatory Visit: Payer: Self-pay | Admitting: Family Medicine

## 2023-08-08 DIAGNOSIS — F325 Major depressive disorder, single episode, in full remission: Secondary | ICD-10-CM

## 2023-08-09 NOTE — Telephone Encounter (Signed)
Requested medication (s) are due for refill today: Yes  Requested medication (s) are on the active medication list: Yes  Last refill:  04/21/23  Future visit scheduled: Yes  Notes to clinic:  Protocol indicates lab work needed.    Requested Prescriptions  Pending Prescriptions Disp Refills   sertraline (ZOLOFT) 50 MG tablet [Pharmacy Med Name: SERTRALINE 50MG  TABLETS] 90 tablet 0    Sig: TAKE 1/2 TO 1 TABLET BY MOUTH EVERY DAY     Psychiatry:  Antidepressants - SSRI - sertraline Failed - 08/09/2023  1:50 PM      Failed - AST in normal range and within 360 days    AST  Date Value Ref Range Status  08/05/2022 24 0 - 40 IU/L Final         Failed - ALT in normal range and within 360 days    ALT  Date Value Ref Range Status  08/05/2022 29 0 - 32 IU/L Final         Failed - Completed PHQ-2 or PHQ-9 in the last 360 days      Passed - Valid encounter within last 6 months    Recent Outpatient Visits           3 months ago Bilateral chronic knee pain   [facility] Simmons-Robinson, Tawanna Cooler, MD   1 year ago Annual physical exam   [facility], [place], MD   2 years ago Annual physical exam    [facility] Merita Norton T, FNP   2 years ago Bronchitis due to COVID-19 virus   [facility] [place], Marzella Schlein, MD   3 years ago Salivary gland enlargement   [facility] [place], Alessandra Bevels, [place]       Future Appointments             In 2 days Simmons-Robinson, Tawanna Cooler, MD [facility]   In 9 months Elie Goody, MD [facility]

## 2023-08-11 ENCOUNTER — Encounter: Payer: BC Managed Care – PPO | Admitting: Family Medicine

## 2023-08-11 ENCOUNTER — Ambulatory Visit (INDEPENDENT_AMBULATORY_CARE_PROVIDER_SITE_OTHER): Payer: BC Managed Care – PPO | Admitting: Family Medicine

## 2023-08-11 ENCOUNTER — Encounter: Payer: Self-pay | Admitting: Family Medicine

## 2023-08-11 VITALS — BP 133/80 | HR 67 | Resp 16 | Ht 68.0 in | Wt 231.5 lb

## 2023-08-11 DIAGNOSIS — K2100 Gastro-esophageal reflux disease with esophagitis, without bleeding: Secondary | ICD-10-CM

## 2023-08-11 DIAGNOSIS — Z1231 Encounter for screening mammogram for malignant neoplasm of breast: Secondary | ICD-10-CM

## 2023-08-11 DIAGNOSIS — E66811 Obesity, class 1: Secondary | ICD-10-CM

## 2023-08-11 DIAGNOSIS — Z130 Encounter for screening for diseases of the blood and blood-forming organs and certain disorders involving the immune mechanism: Secondary | ICD-10-CM

## 2023-08-11 DIAGNOSIS — F325 Major depressive disorder, single episode, in full remission: Secondary | ICD-10-CM

## 2023-08-11 DIAGNOSIS — Z6834 Body mass index (BMI) 34.0-34.9, adult: Secondary | ICD-10-CM

## 2023-08-11 DIAGNOSIS — Z0001 Encounter for general adult medical examination with abnormal findings: Secondary | ICD-10-CM | POA: Diagnosis not present

## 2023-08-11 DIAGNOSIS — G8929 Other chronic pain: Secondary | ICD-10-CM

## 2023-08-11 DIAGNOSIS — M25562 Pain in left knee: Secondary | ICD-10-CM

## 2023-08-11 DIAGNOSIS — E559 Vitamin D deficiency, unspecified: Secondary | ICD-10-CM

## 2023-08-11 DIAGNOSIS — M25561 Pain in right knee: Secondary | ICD-10-CM

## 2023-08-11 DIAGNOSIS — Z0000 Encounter for general adult medical examination without abnormal findings: Secondary | ICD-10-CM

## 2023-08-11 DIAGNOSIS — R142 Eructation: Secondary | ICD-10-CM

## 2023-08-11 DIAGNOSIS — E7800 Pure hypercholesterolemia, unspecified: Secondary | ICD-10-CM

## 2023-08-11 DIAGNOSIS — B977 Papillomavirus as the cause of diseases classified elsewhere: Secondary | ICD-10-CM

## 2023-08-11 DIAGNOSIS — I10 Essential (primary) hypertension: Secondary | ICD-10-CM

## 2023-08-11 NOTE — Assessment & Plan Note (Addendum)
Chronic conditions are stable  Patient was counseled on benefits of regular physical activity with goal of 150 minutes of moderate to vigurous intensity 4 days per week  Patient was counseled to consume well balanced diet of fruits, vegetables, limited saturated fats and limited sugary foods and beverages with emphasis on consuming 6-8 glasses of water daily  Screening recommended today: A1c, lipids,CMP,CBC  Colon cancer screening: completed 2016    Cervical CA screening: high risk HPV in Jan 2023, evaluated by GYN recommended for one year repeat pap with co-testing, patient advised to schedule follow up with GYN as recommended in Jan 2023   Mammogram: ordered     Lung CA screening CT: does not apply     Vaccines recommended today: COVID, Tetanus booster  LAST tetanus vaccine was in 2012

## 2023-08-11 NOTE — Progress Notes (Addendum)
Complete physical exam   Patient: Amanda Day   DOB: 1966-01-11   57 y.o. Female  MRN: [id number] Visit Date: 08/11/2023  Today's healthcare provider: Ronnald Ramp, MD   Chief Complaint  Patient presents with   Annual Exam   Subjective    Amanda Day is a 57 y.o. female who presents today for a complete physical exam.   She reports consuming a general diet, low sodium.   Home exercise routine includes follows online daily burn video most days, has a walk at lunch time during the week or in the afternoons.   She generally feels well.   She reports sleeping well.    She does not have additional problems to discuss today.   Discussed the use of AI scribe software for clinical note transcription with the patient, who gave verbal consent to proceed.  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 The patient reports no new health issues since the last visit. She has been managing her knee pain with Meloxicam, which she finds more convenient and effective than intermittent ibuprofen use. Despite the medication, she still experiences discomfort and occasional sharp pain in the knees, particularly when walking up stairs. The patient is considering a consultation with an orthopedic specialist for further management of her knee pain.  The patient also reports frequent burping, which she has been trying to manage by avoiding certain foods, such as garlic powder, and taking Omeprazole. The burping is not associated with heartburn but is described as producing a lot of gas and being bothersome. The patient is considering a referral to gastroenterology if the symptoms become more bothersome or if her dietary variety continues to shrink due to the symptoms.  The patient  maintains a balanced diet, with a preference for vegetables and low sodium foods. She engages in regular exercise, including a 30-minute online workout most mornings and occasional walks during lunchtime or in the afternoon. Despite these efforts, the patient's BMI is 35.2, indicating obesity.  The patient has received two doses of the Shingrix vaccine and had her last tetanus vaccine in 2012. She has also received the flu vaccine and the last dose of the COVID-19 vaccine. The patient has a history of vitamin D deficiency and is due for a mammogram for breast cancer screening. She also has a history of high-risk HPV detected on a Pap smear in January of the previous year, but she has not followed up with a repeat Pap smear as recommended.         Past Medical History:  Diagnosis Date   Abnormal Pap smear of cervix 08/07/2021   Anxiety    Arthritis    right great toe   Basal cell carcinoma 05/01/2020   Left anterior neck. Nodular. - excised 07/16/2020   GERD (gastroesophageal reflux disease)    OCC   Hypertension    Molluscum contagiosum infection 08/03/2021   Motion sickness    long car rides, amusement park rides   S/P arthroscopy of left knee 11/21/2016   Seasonal allergies    Past Surgical History:  Procedure Laterality Date   COLONOSCOPY N/A 02/21/2015   Procedure: COLONOSCOPY;  Surgeon: Midge Minium, MD;  Location: [facility];  Service: Gastroenterology;  Laterality: N/A;   GUM SURGERY     grafting  08/2022 and  01/2023   KNEE ARTHROSCOPY Left 10/04/2016   Procedure: ARTHROSCOPY KNEE;  Surgeon: Donato Heinz, MD;  Location: [facility];  Service: Orthopedics;  Laterality: Left;   KNEE ARTHROSCOPY Right 04/06/2017   Procedure: ARTHROSCOPY KNEE, PARTIAL MEDIAL MENISCECTOMY, MEDIAL CHONDROPLASTY;  Surgeon: Donato Heinz, MD;  Location: [facility];  Service: Orthopedics;  Laterality: Right;   laser vaporization of VIN  01/09/2008   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 and Dr Fleet Contras   TUBAL  LIGATION  2009   Social History   Socioeconomic History   Marital status: Divorced    Spouse name: Not on file   Number of children: 2   Years of education: 18   Highest education level: Not on file  Occupational History   Occupation: [employer]  Tobacco Use   Smoking status: Never   Smokeless tobacco: Never  Vaping Use   Vaping status: Never Used  Substance and Sexual Activity   Alcohol use: Yes    Comment: 4 nights a week, wine or beer   Drug use: No   Sexual activity: Yes    Birth control/protection: Surgical  Other Topics Concern   Not on file  Social History Narrative   Not on file   Social Drivers of Health   Financial Resource Strain: Not on file  Food Insecurity: Not on file  Transportation Needs: Not on file  Physical Activity: Not on file  Stress: Not on file  Social Connections: Not on file  Intimate Partner Violence: Not on file   Family Status  Relation Name Status   Mother  Alive   Father  Deceased at age 46   Sister  Alive   Brother  Alive  No partnership data on file   Family History  Problem Relation Age of Onset   Hypertension Mother    Anxiety disorder Mother    Mitral valve prolapse Mother    Cancer Father        Lung and Kidney   Hypertension Father    Arrhythmia Father    Hypertension Sister    Hypertension Brother    No Known Allergies   Medications: Outpatient Medications Prior to Visit  Medication Sig   amLODipine (NORVASC) 10 MG tablet Take 1 tablet (10 mg total) by mouth daily.   Cholecalciferol (VITAMIN D) 2000 units tablet Take 1 tablet by mouth daily.   diclofenac (VOLTAREN) 75 MG EC tablet Take 1 tablet (75 mg total) by mouth 2 (two) times daily.   fluticasone (FLONASE) 50 MCG/ACT nasal spray SHAKE LIQUID AND USE 1 TO 2 SPRAYS IN EACH NOSTRIL DAILY   Glucosamine-Chondroitin-MSM 500-200-150 MG TABS Take 1 tablet by mouth daily.   ibuprofen (ADVIL,MOTRIN) 200 MG tablet Take 400 mg by  mouth 3 (three) times daily as needed for headache or mild pain.    metoprolol succinate (TOPROL-XL) 100 MG 24 hr tablet Take 1 tablet (100 mg total) by mouth daily. Take with or immediately following a meal.   montelukast (SINGULAIR) 10 MG tablet TAKE 1 TABLET(10 MG) BY MOUTH AT BEDTIME   omeprazole (PRILOSEC) 10 MG capsule Take 1 capsule by mouth as needed.   pyridoxine (B-6) 100 MG tablet Take 100 mg by mouth daily.   sertraline (ZOLOFT) 50 MG tablet TAKE 1/2 TO 1 TABLET BY MOUTH EVERY DAY   vitamin B-12 (CYANOCOBALAMIN) 1000 MCG tablet Take 1,000 mcg by mouth daily.   No facility-administered medications prior to visit.    Review of Systems  Last CBC  Lab Results  Component Value Date   WBC 4.7 08/05/2022   HGB 13.1 08/05/2022   HCT 39.7 08/05/2022   MCV 90 08/05/2022   MCH 29.8 08/05/2022   RDW 12.5 08/05/2022   PLT 252 08/05/2022   Last metabolic panel Lab Results  Component Value Date   GLUCOSE 96 04/13/2023   NA 144 04/13/2023   K 4.4 04/13/2023   CL 105 04/13/2023   CO2 23 04/13/2023   BUN 17 04/13/2023   CREATININE 0.91 04/13/2023   EGFR 73 04/13/2023   CALCIUM 9.7 04/13/2023   PROT 6.7 08/05/2022   ALBUMIN 4.4 08/05/2022   LABGLOB 2.3 08/05/2022   AGRATIO 1.9 08/05/2022   BILITOT 0.4 08/05/2022   ALKPHOS 74 08/05/2022   AST 24 08/05/2022   ALT 29 08/05/2022   Last lipids Lab Results  Component Value Date   CHOL 222 (H) 08/05/2022   HDL 52 08/05/2022   LDLCALC 140 (H) 08/05/2022   TRIG 166 (H) 08/05/2022   CHOLHDL 4.3 08/05/2022   Last hemoglobin A1c Lab Results  Component Value Date   HGBA1C 5.3 08/05/2022   Last thyroid functions Lab Results  Component Value Date   TSH 1.510 08/03/2021   Last vitamin D Lab Results  Component Value Date   VD25OH 54.8 08/05/2022       Objective    BP 133/80   Pulse 67   Resp 16   Ht 5\' 8"  (1.727 m)   Wt 231 lb 8 oz (105 kg)   LMP 05/23/2018   SpO2 98%   BMI 35.20 kg/m   BP Readings from Last  3 Encounters:  08/11/23 133/80  05/26/23 117/73  04/13/23 134/85   Wt Readings from Last 3 Encounters:  08/11/23 231 lb 8 oz (105 kg)  04/13/23 228 lb 8 oz (103.6 kg)  08/05/22 232 lb 12.8 oz (105.6 kg)        Physical Exam Vitals reviewed.  Constitutional:      General: She is not in acute distress.    Appearance: Normal appearance. She is not ill-appearing, toxic-appearing or diaphoretic.  HENT:     Head: Normocephalic and atraumatic.     Right Ear: Tympanic membrane and external ear normal. There is no impacted cerumen.     Left Ear: Tympanic membrane and external ear normal. There is no impacted cerumen.     Nose: Nose normal.     Mouth/Throat:     Pharynx: Oropharynx is clear.  Eyes:     General: No scleral icterus.    Extraocular Movements: Extraocular movements intact.     Conjunctiva/sclera: Conjunctivae normal.     Pupils: Pupils are equal, round, and reactive to light.  Cardiovascular:     Rate and Rhythm: Normal rate and regular rhythm.     Pulses: Normal pulses.     Heart sounds: Normal heart sounds. No murmur heard.    No friction rub. No gallop.  Pulmonary:     Effort: Pulmonary effort is normal. No respiratory distress.     Breath sounds: Normal breath sounds. No wheezing, rhonchi or rales.  Abdominal:     General: Bowel sounds are normal. There is no distension.     Palpations: Abdomen is soft. There is no mass.     Tenderness: There is no abdominal tenderness. There is no guarding.  Musculoskeletal:        General: No deformity.     Cervical back: Normal range of motion and neck supple. No rigidity.  Right lower leg: No edema.     Left lower leg: No edema.  Lymphadenopathy:     Cervical: No cervical adenopathy.  Skin:    General: Skin is warm.     Capillary Refill: Capillary refill takes less than 2 seconds.     Findings: No erythema or rash.  Neurological:     General: No focal deficit present.     Mental Status: She is alert and oriented  to person, place, and time.     Motor: No weakness.     Gait: Gait normal.  Psychiatric:        Mood and Affect: Mood normal.        Behavior: Behavior normal.       Last depression screening scores    08/11/2023    8:05 AM 08/05/2022    8:17 AM 08/03/2021    9:11 AM  PHQ 2/9 Scores  PHQ - 2 Score 0 0 0  PHQ- 9 Score 0 0 2      Last Audit-C alcohol use screening    08/05/2022    8:17 AM  Alcohol Use Disorder Test (AUDIT)  1. How often do you have a drink containing alcohol? 2  2. How many drinks containing alcohol do you have on a typical day when you are drinking? 0  3. How often do you have six or more drinks on one occasion? 0  AUDIT-C Score 2   A score of 3 or more in women, and 4 or more in men indicates increased risk for alcohol abuse, EXCEPT if all of the points are from question 1   No results found for any visits on 08/11/23.  Assessment & Plan    Routine Health Maintenance and Physical Exam  Immunization History  Administered Date(s) Administered   Influenza,inj,Quad PF,6+ Mos 05/10/2013, 05/24/2015, 07/28/2017, 06/07/2018, 06/19/2019   Influenza-Unspecified 06/16/2020, 06/10/2021, 05/25/2022   Moderna Covid-19 Fall Seasonal Vaccine 56yrs & older 05/25/2022   PFIZER(Purple Top)SARS-COV-2 Vaccination 11/16/2019, 12/14/2019, 06/16/2020   Pfizer Covid-19 Vaccine Bivalent Booster 54yrs & up 06/10/2021   Tdap 03/15/2011   Zoster Recombinant(Shingrix) 06/19/2019, 10/15/2019    Health Maintenance  Topic Date Due   DTaP/Tdap/Td (2 - Td or Tdap) 03/14/2021   MAMMOGRAM  09/17/2022   INFLUENZA VACCINE  03/24/2023   COVID-19 Vaccine (6 - 2024-25 season) 04/24/2023   Colonoscopy  02/20/2025   Cervical Cancer Screening (HPV/Pap Cotest)  08/03/2026   Hepatitis C Screening  Completed   HIV Screening  Completed   Zoster Vaccines- Shingrix  Completed   HPV VACCINES  Aged Out    Problem List Items Addressed This Visit       Cardiovascular and Mediastinum    Essential (primary) hypertension   Relevant Orders   CMP14+EGFR     Digestive   Acid reflux     Other   Obesity (BMI 30.0-34.9)   Relevant Orders   Hemoglobin A1c   CMP14+EGFR   Lipid panel   TSH+T4F+T3Free   High risk HPV infection   Elevated LDL cholesterol level   Depression, major, single episode, complete remission (HCC)   Relevant Orders   TSH+T4F+T3Free   Burping   BMI 34.0-34.9,adult   Bilateral chronic knee pain   Avitaminosis D   Relevant Orders   VITAMIN D 25 Hydroxy (Vit-D Deficiency, Fractures)   Annual physical exam - Primary   Chronic conditions are stable  Patient was counseled on benefits of regular physical activity with goal of 150 minutes of moderate to  vigurous intensity 4 days per week  Patient was counseled to consume well balanced diet of fruits, vegetables, limited saturated fats and limited sugary foods and beverages with emphasis on consuming 6-8 glasses of water daily  Screening recommended today: A1c, lipids,CMP,CBC  Colon cancer screening: completed 2016    Cervical CA screening: high risk HPV in Jan 2023, evaluated by GYN recommended for one year repeat pap with co-testing, patient advised to schedule follow up with GYN as recommended in Jan 2023   Mammogram: ordered     Lung CA screening CT: does not apply     Vaccines recommended today: COVID, Tetanus booster  LAST tetanus vaccine was in 2012        Other Visit Diagnoses       Encounter for screening mammogram for malignant neoplasm of breast       Relevant Orders   MM 3D SCREENING MAMMOGRAM BILATERAL BREAST     Screening for deficiency anemia       Relevant Orders   CBC           Annual Physical Examination Routine annual physical for a 57 year old with hypertension, depression, high-risk HPV, elevated LDL cholesterol, and long-term NSAID use for knee pain. BMI is 35.2. Exercises regularly and follows a low-sodium diet. Reports intermittent sharp knee pain, especially when  walking up stairs. History of vitamin D deficiency and obesity. - Order CBC, A1c, CMP, lipid panel, vitamin D, and thyroid levels - Order mammogram - Administer updated tetanus vaccine - Confirm flu and COVID-19 vaccinations received on 05/18/2023 - Recommend 150 minutes of exercise per week and a well-balanced diet  Knee Pain Chronic knee pain managed with NSAIDs. Reports intermittent sharp pain, especially when walking up stairs. Considering orthopedics evaluation. - Continue NSAIDs as needed - Advise scheduling an appointment with orthopedics  Hypertension Well-managed with lifestyle modifications and medications. - Continue current management  Elevated LDL Cholesterol Elevated LDL cholesterol managed with diet and exercise. - Order lipid panel - Recommend 150 minutes of exercise per week and a well-balanced diet  Depression Well-managed with medications. - Continue current management  High-Risk HPV High-risk HPV on last Pap smear in January 2023. Follow-up Pap smear not yet completed. - Advise scheduling follow-up Pap smear with gynecology  General Health Maintenance Routine health maintenance including vaccinations and screenings. Received two doses of Shingrix and flu and COVID-19 vaccinations on 05/18/2023. - Administer updated tetanus vaccine - Confirm flu and COVID-19 vaccinations received on 05/18/2023  Follow-up - Follow-up in 6 months for general check-in - Schedule next annual physical in December 2025.           Return in about 6 months (around 02/09/2024) for HTN.       Ronnald Ramp, MD  [facility] [phone] (phone) [phone] (fax)  [facility]

## 2023-08-11 NOTE — Patient Instructions (Addendum)
It was a pleasure to see you today!  Thank you for choosing [facility] for your primary care.   Today you were seen for your annual physical  Please review the attached information regarding helpful preventive health topics.   To keep you healthy, please keep in mind the following health maintenance items that you are due for:   1.Updated tetanus vaccine  2. Repeat Pap smear  3. Mammogram   Please make sure to schedule your next annual physical for one year from today.   Best Wishes,   Dr. Allyson Sabal [facility] at [facility] [address] [place],  [place]  [address] Main: [phone]

## 2023-08-12 ENCOUNTER — Other Ambulatory Visit: Payer: Self-pay | Admitting: Family Medicine

## 2023-08-12 DIAGNOSIS — G8929 Other chronic pain: Secondary | ICD-10-CM

## 2023-08-12 LAB — HEMOGLOBIN A1C
Est. average glucose Bld gHb Est-mCnc: 114 mg/dL
Hgb A1c MFr Bld: 5.6 % (ref 4.8–5.6)

## 2023-08-12 LAB — CMP14+EGFR
ALT: 32 [IU]/L (ref 0–32)
AST: 23 [IU]/L (ref 0–40)
Albumin: 4.2 g/dL (ref 3.8–4.9)
Alkaline Phosphatase: 70 [IU]/L (ref 44–121)
BUN/Creatinine Ratio: 25 — ABNORMAL HIGH (ref 9–23)
BUN: 23 mg/dL (ref 6–24)
Bilirubin Total: 0.4 mg/dL (ref 0.0–1.2)
CO2: 24 mmol/L (ref 20–29)
Calcium: 9.4 mg/dL (ref 8.7–10.2)
Chloride: 105 mmol/L (ref 96–106)
Creatinine, Ser: 0.91 mg/dL (ref 0.57–1.00)
Globulin, Total: 2.3 g/dL (ref 1.5–4.5)
Glucose: 86 mg/dL (ref 70–99)
Potassium: 4.3 mmol/L (ref 3.5–5.2)
Sodium: 143 mmol/L (ref 134–144)
Total Protein: 6.5 g/dL (ref 6.0–8.5)
eGFR: 73 mL/min/{1.73_m2} (ref >59)

## 2023-08-12 LAB — LIPID PANEL
Chol/HDL Ratio: 5.5 {ratio} — ABNORMAL HIGH (ref 0.0–4.4)
Cholesterol, Total: 259 mg/dL — ABNORMAL HIGH (ref 100–199)
HDL: 47 mg/dL (ref >39)
LDL Chol Calc (NIH): 175 mg/dL — ABNORMAL HIGH (ref 0–99)
Triglycerides: 197 mg/dL — ABNORMAL HIGH (ref 0–149)
VLDL Cholesterol Cal: 37 mg/dL (ref 5–40)

## 2023-08-12 LAB — CBC
Hematocrit: 39 % (ref 34.0–46.6)
Hemoglobin: 12.9 g/dL (ref 11.1–15.9)
MCH: 30.7 pg (ref 26.6–33.0)
MCHC: 33.1 g/dL (ref 31.5–35.7)
MCV: 93 fL (ref 79–97)
Platelets: 263 10*3/uL (ref 150–450)
RBC: 4.2 x10E6/uL (ref 3.77–5.28)
RDW: 12.6 % (ref 11.7–15.4)
WBC: 4.7 10*3/uL (ref 3.4–10.8)

## 2023-08-12 LAB — TSH+T4F+T3FREE
Free T4: 1.2 ng/dL (ref 0.82–1.77)
T3, Free: 3.2 pg/mL (ref 2.0–4.4)
TSH: 2.22 u[IU]/mL (ref 0.450–4.500)

## 2023-08-12 LAB — VITAMIN D 25 HYDROXY (VIT D DEFICIENCY, FRACTURES): Vit D, 25-Hydroxy: 45.2 ng/mL (ref 30.0–100.0)

## 2023-09-09 ENCOUNTER — Telehealth: Payer: Self-pay | Admitting: Family Medicine

## 2023-09-09 MED ORDER — METOPROLOL SUCCINATE ER 100 MG PO TB24: 100.0000 mg | ORAL_TABLET | Freq: Every day | ORAL | 1 refills | Status: DC

## 2023-09-09 NOTE — Telephone Encounter (Signed)
[facility] pharmacy is requesting refill metoprolol succinate (TOPROL-XL) 100 MG 24 hr tablet  Please advise

## 2023-09-11 ENCOUNTER — Other Ambulatory Visit: Payer: Self-pay | Admitting: Family Medicine

## 2023-09-11 DIAGNOSIS — G8929 Other chronic pain: Secondary | ICD-10-CM

## 2023-09-12 ENCOUNTER — Encounter: Payer: Self-pay | Admitting: Family Medicine

## 2023-10-26 ENCOUNTER — Encounter: Payer: Self-pay | Admitting: Family Medicine

## 2023-10-26 LAB — HM MAMMOGRAPHY

## 2023-10-31 ENCOUNTER — Telehealth: Payer: Self-pay | Admitting: Family Medicine

## 2023-10-31 DIAGNOSIS — I10 Essential (primary) hypertension: Secondary | ICD-10-CM

## 2023-10-31 MED ORDER — AMLODIPINE BESYLATE 10 MG PO TABS: 10.0000 mg | ORAL_TABLET | Freq: Every day | ORAL | 3 refills | Status: AC

## 2023-10-31 NOTE — Telephone Encounter (Signed)
 LOV 12*19*24 NOV [id number] LRF [id number] LABS 16*10*96

## 2023-10-31 NOTE — Telephone Encounter (Signed)
 [facility] faxed refill request for the following medications:   amLODipine (NORVASC) 10 MG tablet      Please advise.

## 2023-12-01 ENCOUNTER — Other Ambulatory Visit: Payer: Self-pay | Admitting: Family Medicine

## 2023-12-01 DIAGNOSIS — G8929 Other chronic pain: Secondary | ICD-10-CM

## 2023-12-02 ENCOUNTER — Other Ambulatory Visit: Payer: Self-pay | Admitting: Family Medicine

## 2023-12-02 DIAGNOSIS — F325 Major depressive disorder, single episode, in full remission: Secondary | ICD-10-CM

## 2023-12-02 NOTE — Telephone Encounter (Signed)
 [facility] pharmacy is requesting refill sertraline (ZOLOFT) 50 MG tablet   Please advise

## 2023-12-02 NOTE — Telephone Encounter (Signed)
 Requested Prescriptions  Pending Prescriptions Disp Refills   diclofenac (VOLTAREN) 75 MG EC tablet [Pharmacy Med Name: DICLOFENAC SODIUM 75MG  DR TABLETS] 180 tablet 0    Sig: TAKE 1 TABLET(75 MG) BY MOUTH TWICE DAILY     Analgesics:  NSAIDS Failed - 12/02/2023 10:14 AM      Failed - Manual Review: Labs are only required if the patient has taken medication for more than 8 weeks.      Failed - Valid encounter within last 12 months    Recent Outpatient Visits   None     Future Appointments             In 2 months Simmons-Robinson, Tawanna Cooler, MD [facility]   In 5 months Elie Goody, MD [facility]            Passed - Cr in normal range and within 360 days    Creatinine, Ser  Date Value Ref Range Status  08/11/2023 0.91 0.57 - 1.00 mg/dL Final         Passed - HGB in normal range and within 360 days    Hemoglobin  Date Value Ref Range Status  08/11/2023 12.9 11.1 - 15.9 g/dL Final         Passed - PLT in normal range and within 360 days    Platelets  Date Value Ref Range Status  08/11/2023 263 150 - 450 x10E3/uL Final         Passed - HCT in normal range and within 360 days    Hematocrit  Date Value Ref Range Status  08/11/2023 39.0 34.0 - 46.6 % Final         Passed - eGFR is 30 or above and within 360 days    GFR calc Af Amer  Date Value Ref Range Status  06/30/2020 93 >59 mL/min/1.73 Final    Comment:    **In accordance with recommendations from the NKF-ASN Task force,**   [facility] is in the process of updating its eGFR calculation to the   2021 CKD-EPI creatinine equation that estimates kidney function   without a race variable.    GFR calc non Af Amer  Date Value Ref Range Status  06/30/2020 81 >59 mL/min/1.73 Final   eGFR  Date Value Ref Range Status  08/11/2023 73 >59 mL/min/1.73 Final         Passed - Patient is not pregnant

## 2023-12-06 MED ORDER — SERTRALINE HCL 50 MG PO TABS: ORAL_TABLET | ORAL | 1 refills | Status: DC

## 2023-12-06 NOTE — Telephone Encounter (Signed)
 LOV 12*19*24 NOV [id number] LRF [id number] LABS 16*10*96

## 2023-12-08 ENCOUNTER — Telehealth: Payer: Self-pay | Admitting: Family Medicine

## 2023-12-08 NOTE — Telephone Encounter (Signed)
 [facility] pharmacy is requesting refill montelukast (SINGULAIR) 10 MG tablet  Please advise

## 2023-12-18 ENCOUNTER — Encounter: Payer: Self-pay | Admitting: Family Medicine

## 2023-12-19 ENCOUNTER — Other Ambulatory Visit: Payer: Self-pay

## 2023-12-19 DIAGNOSIS — J301 Allergic rhinitis due to pollen: Secondary | ICD-10-CM

## 2023-12-19 MED ORDER — MONTELUKAST SODIUM 10 MG PO TABS: ORAL_TABLET | ORAL | 3 refills | Status: AC

## 2024-01-10 ENCOUNTER — Other Ambulatory Visit: Payer: Self-pay | Admitting: Family Medicine

## 2024-01-10 DIAGNOSIS — J301 Allergic rhinitis due to pollen: Secondary | ICD-10-CM

## 2024-02-09 ENCOUNTER — Ambulatory Visit: Payer: Self-pay | Admitting: Family Medicine

## 2024-02-23 ENCOUNTER — Ambulatory Visit: Payer: Self-pay | Admitting: Family Medicine

## 2024-02-23 ENCOUNTER — Encounter: Payer: Self-pay | Admitting: Family Medicine

## 2024-02-23 VITALS — BP 130/82 | HR 66 | Ht 68.0 in | Wt 225.0 lb

## 2024-02-23 DIAGNOSIS — M25562 Pain in left knee: Secondary | ICD-10-CM

## 2024-02-23 DIAGNOSIS — E7800 Pure hypercholesterolemia, unspecified: Secondary | ICD-10-CM

## 2024-02-23 DIAGNOSIS — F325 Major depressive disorder, single episode, in full remission: Secondary | ICD-10-CM | POA: Diagnosis not present

## 2024-02-23 DIAGNOSIS — I10 Essential (primary) hypertension: Secondary | ICD-10-CM | POA: Diagnosis not present

## 2024-02-23 DIAGNOSIS — Z1159 Encounter for screening for other viral diseases: Secondary | ICD-10-CM

## 2024-02-23 DIAGNOSIS — R142 Eructation: Secondary | ICD-10-CM

## 2024-02-23 DIAGNOSIS — K2100 Gastro-esophageal reflux disease with esophagitis, without bleeding: Secondary | ICD-10-CM

## 2024-02-23 DIAGNOSIS — E559 Vitamin D deficiency, unspecified: Secondary | ICD-10-CM

## 2024-02-23 DIAGNOSIS — M25561 Pain in right knee: Secondary | ICD-10-CM

## 2024-02-23 DIAGNOSIS — Z23 Encounter for immunization: Secondary | ICD-10-CM | POA: Diagnosis not present

## 2024-02-23 DIAGNOSIS — G8929 Other chronic pain: Secondary | ICD-10-CM

## 2024-02-23 DIAGNOSIS — Z791 Long term (current) use of non-steroidal anti-inflammatories (NSAID): Secondary | ICD-10-CM

## 2024-02-23 DIAGNOSIS — E538 Deficiency of other specified B group vitamins: Secondary | ICD-10-CM

## 2024-02-23 NOTE — Assessment & Plan Note (Signed)
 Hypertension Chronic hypertension, well-controlled with a blood pressure of 130/82 mmHg on amlodipine  10 mg daily and metoprolol  100 mg daily. - Continue amlodipine  10 mg daily - Continue metoprolol  100 mg daily

## 2024-02-23 NOTE — Progress Notes (Signed)
 Established patient visit   Patient: Amanda Day   DOB: 06-27-1966   58 y.o. Female  MRN: [id number] Visit Date: 02/23/2024  Today's healthcare provider: Rockie Agent, MD   Chief Complaint  Patient presents with   Hypertension   Subjective       Discussed the use of AI scribe software for clinical note transcription with the patient, who gave verbal consent to proceed.  History of Present Illness Amanda Day is a 58 year old female who presents for follow-up.  Her blood pressure is currently controlled at 130/82 mmHg with amlodipine  10 mg daily and metoprolol  100 mg daily.  She experiences acid reflux, particularly exacerbated by foods containing garlic powder, leading to burping. She uses omeprazole 10 mg as needed, which is effective, but she prefers to minimize its use.  She is alternating between 25 mg and 50 mg of Zoloft  daily for depression, which she finds effective.  She takes vitamin B12 and vitamin D  supplements, with vitamin D  at a dose of 2000 units daily. Her last vitamin D  level was 45.  She has a history of high cholesterol, managed by reducing intake of eggs and cheese. She had an egg and kale for breakfast today.  She is undergoing physical therapy for knee and hand issues. Cortisone shots have been helpful for trigger finger, but gel shots for her knees did not provide relief.     Past Medical History:  Diagnosis Date   Abnormal Pap smear of cervix 08/07/2021   Anxiety    Arthritis    right great toe   Basal cell carcinoma 05/01/2020   Left anterior neck. Nodular. - excised 07/16/2020   GERD (gastroesophageal reflux disease)    OCC   Hypertension    Molluscum contagiosum infection 08/03/2021   Motion sickness    long car rides, amusement park rides   S/P arthroscopy of left knee 11/21/2016   Seasonal allergies     Medications: Outpatient Medications Prior to Visit  Medication Sig   amLODipine   (NORVASC ) 10 MG tablet Take 1 tablet (10 mg total) by mouth daily.   Cholecalciferol (VITAMIN D ) 2000 units tablet Take 1 tablet by mouth daily.   diclofenac  (VOLTAREN ) 75 MG EC tablet TAKE 1 TABLET(75 MG) BY MOUTH TWICE DAILY   fluticasone  (FLONASE ) 50 MCG/ACT nasal spray SHAKE LIQUID AND USE 1 TO 2 SPRAYS IN EACH NOSTRIL DAILY   Glucosamine-Chondroitin-MSM 500-200-150 MG TABS Take 1 tablet by mouth daily.   ibuprofen (ADVIL,MOTRIN) 200 MG tablet Take 400 mg by mouth 3 (three) times daily as needed for headache or mild pain.    metoprolol  succinate (TOPROL -XL) 100 MG 24 hr tablet Take 1 tablet (100 mg total) by mouth daily. Take with or immediately following a meal.   montelukast  (SINGULAIR ) 10 MG tablet TAKE 1 TABLET(10 MG) BY MOUTH AT BEDTIME   omeprazole (PRILOSEC) 10 MG capsule Take 1 capsule by mouth as needed.   pyridoxine (B-6) 100 MG tablet Take 100 mg by mouth daily.   sertraline  (ZOLOFT ) 50 MG tablet TAKE 1/2 TO 1 TABLET BY MOUTH EVERY DAY   vitamin B-12 (CYANOCOBALAMIN) 1000 MCG tablet Take 1,000 mcg by mouth daily.   No facility-administered medications prior to visit.    Review of Systems  Last metabolic panel Lab Results  Component Value Date   GLUCOSE 86 08/11/2023   NA 143 08/11/2023   K 4.3 08/11/2023   CL 105 08/11/2023   CO2 24 08/11/2023  BUN 23 08/11/2023   CREATININE 0.91 08/11/2023   EGFR 73 08/11/2023   CALCIUM 9.4 08/11/2023   PROT 6.5 08/11/2023   ALBUMIN 4.2 08/11/2023   LABGLOB 2.3 08/11/2023   AGRATIO 1.9 08/05/2022   BILITOT 0.4 08/11/2023   ALKPHOS 70 08/11/2023   AST 23 08/11/2023   ALT 32 08/11/2023   Last lipids Lab Results  Component Value Date   CHOL 259 (H) 08/11/2023   HDL 47 08/11/2023   LDLCALC 175 (H) 08/11/2023   TRIG 197 (H) 08/11/2023   CHOLHDL 5.5 (H) 08/11/2023   The 10-year ASCVD risk score (Arnett DK, et al., 2019) is: 5.7%  Last hemoglobin A1c Lab Results  Component Value Date   HGBA1C 5.6 08/11/2023   Last  vitamin D  Lab Results  Component Value Date   VD25OH 45.2 08/11/2023   Last vitamin B12 and Folate Lab Results  Component Value Date   VITAMINB12 1,258 (H) 06/19/2019        Objective    BP 130/82   Pulse 66   Ht 5' 8 (1.727 m)   Wt 225 lb (102.1 kg)   LMP 05/23/2018   SpO2 100%   BMI 34.21 kg/m      Physical Exam Vitals reviewed.  Constitutional:      General: She is not in acute distress.    Appearance: Normal appearance. She is not ill-appearing.  Cardiovascular:     Rate and Rhythm: Normal rate and regular rhythm.  Pulmonary:     Effort: Pulmonary effort is normal. No respiratory distress.     Breath sounds: No wheezing, rhonchi or rales.  Neurological:     Mental Status: She is alert and oriented to person, place, and time.  Psychiatric:        Mood and Affect: Mood normal.        Behavior: Behavior normal.       No results found for any visits on 02/23/24.  Assessment & Plan     Problem List Items Addressed This Visit       Cardiovascular and Mediastinum   Essential (primary) hypertension - Primary   Hypertension Chronic hypertension, well-controlled with a blood pressure of 130/82 mmHg on amlodipine  10 mg daily and metoprolol  100 mg daily. - Continue amlodipine  10 mg daily - Continue metoprolol  100 mg daily        Digestive   Acid reflux   Gastroesophageal Reflux Disease (GERD) Chronic,somewhat improved  She experiences acid reflux with triggers including garlic powder. Omeprazole 10 mg as needed provides relief. She is interested in trying probiotics as an adjunctive treatment, which may take at least three months to show effects and are not a permanent solution. - Start an OTC probiotic regimen for three months - Continue omeprazole 10 mg as needed - Consider referral to gastroenterology if symptoms persist despite lifestyle modifications and current treatment        Other   Vitamin B12 deficiency   Vitamin B12 Deficiency Vitamin  B12 levels have not been checked since 2020. She is currently taking vitamin B12 supplements. - Order vitamin B12 level      Relevant Orders   Vitamin B12   Encounter for long-term (current) use of NSAIDs   Elevated LDL cholesterol level   Hyperlipidemia She has made dietary changes to reduce intake of eggs and cheese. She is not fasting today but prefers to have her lipid panel checked immediately. - Order lipid panel today, acknowledging non-fasting state  Relevant Orders   Lipid panel   Depression, major, single episode, complete remission (HCC)   Depression Chronic, stable  She is on sertraline  (Zoloft ) 25 mg alternating with 50 mg daily, reporting feeling fine with this regimen, indicating well-managed depression symptoms. - Continue sertraline  25 mg alternating with 50 mg daily      Bilateral chronic knee pain   Osteoarthritis She is undergoing physical therapy for knee and hand pain. Cortisone injections for trigger finger have been effective. Gel injections for knee pain were not effective. She plans to continue physical therapy and explore further options if knee pain persists. - Continue physical therapy for knees and hands - Consider further evaluation or treatment options if knee pain persists      Avitaminosis D    Vitamin D  Deficiency Her vitamin D  level was last checked in December and was adequate at 45 ng/mL. She is currently taking 2000 IU of vitamin D  daily. -continue daily  supplement      Other Visit Diagnoses       Belching         Need for tetanus, diphtheria, and acellular pertussis (Tdap) vaccine       Relevant Orders   Tdap vaccine greater than or equal to 7yo IM (Completed)     Measles screening       Relevant Orders   Measles/Mumps/Rubella Immunity       Assessment & Plan      General Health Maintenance She is due for a tetanus vaccine and is concerned about measles immunity due to recent outbreaks. She recalls being vaccinated  as a child and possibly receiving a booster in college. - Administered tetanus vaccine today  - Order measles titer to assess immunity     Return in about 6 months (around 08/13/2024) for CPE.         Rockie Agent, MD  [facility] [phone] (phone) [phone] (fax)  [facility]

## 2024-02-23 NOTE — Assessment & Plan Note (Signed)
 Depression Chronic, stable  She is on sertraline  (Zoloft ) 25 mg alternating with 50 mg daily, reporting feeling fine with this regimen, indicating well-managed depression symptoms. - Continue sertraline  25 mg alternating with 50 mg daily

## 2024-02-23 NOTE — Assessment & Plan Note (Signed)
 Osteoarthritis She is undergoing physical therapy for knee and hand pain. Cortisone injections for trigger finger have been effective. Gel injections for knee pain were not effective. She plans to continue physical therapy and explore further options if knee pain persists. - Continue physical therapy for knees and hands - Consider further evaluation or treatment options if knee pain persists

## 2024-02-23 NOTE — Assessment & Plan Note (Signed)
 Vitamin B12 Deficiency Vitamin B12 levels have not been checked since 2020. She is currently taking vitamin B12 supplements. - Order vitamin B12 level

## 2024-02-23 NOTE — Assessment & Plan Note (Signed)
  Vitamin D  Deficiency Her vitamin D  level was last checked in December and was adequate at 45 ng/mL. She is currently taking 2000 IU of vitamin D  daily. -continue daily  supplement

## 2024-02-23 NOTE — Assessment & Plan Note (Signed)
 Hyperlipidemia She has made dietary changes to reduce intake of eggs and cheese. She is not fasting today but prefers to have her lipid panel checked immediately. - Order lipid panel today, acknowledging non-fasting state

## 2024-02-23 NOTE — Assessment & Plan Note (Signed)
 Gastroesophageal Reflux Disease (GERD) Chronic,somewhat improved  She experiences acid reflux with triggers including garlic powder. Omeprazole 10 mg as needed provides relief. She is interested in trying probiotics as an adjunctive treatment, which may take at least three months to show effects and are not a permanent solution. - Start an OTC probiotic regimen for three months - Continue omeprazole 10 mg as needed - Consider referral to gastroenterology if symptoms persist despite lifestyle modifications and current treatment

## 2024-02-24 LAB — LIPID PANEL
Chol/HDL Ratio: 5.7 ratio — ABNORMAL HIGH (ref 0.0–4.4)
Cholesterol, Total: 273 mg/dL — ABNORMAL HIGH (ref 100–199)
HDL: 48 mg/dL (ref >39)
LDL Chol Calc (NIH): 182 mg/dL — ABNORMAL HIGH (ref 0–99)
Triglycerides: 228 mg/dL — ABNORMAL HIGH (ref 0–149)
VLDL Cholesterol Cal: 43 mg/dL — ABNORMAL HIGH (ref 5–40)

## 2024-02-24 LAB — MEASLES/MUMPS/RUBELLA IMMUNITY
MUMPS ABS, IGG: 148 [AU]/ml (ref >10.9)
RUBEOLA AB, IGG: 123 [AU]/ml (ref >16.4)
Rubella Antibodies, IGG: 5.75 {index} (ref >0.99)

## 2024-02-24 LAB — VITAMIN B12: Vitamin B-12: >2000 pg/mL — ABNORMAL HIGH (ref 232–1245)

## 2024-02-27 ENCOUNTER — Ambulatory Visit: Payer: Self-pay | Admitting: Family Medicine

## 2024-02-28 ENCOUNTER — Other Ambulatory Visit: Payer: Self-pay | Admitting: Family Medicine

## 2024-02-28 DIAGNOSIS — G8929 Other chronic pain: Secondary | ICD-10-CM

## 2024-03-05 ENCOUNTER — Other Ambulatory Visit: Payer: Self-pay | Admitting: Family Medicine

## 2024-03-05 NOTE — Telephone Encounter (Signed)
 LOV 11/24/23 NOV 08/28/24 LRF 09/09/23 LABS 02/23/24

## 2024-04-10 ENCOUNTER — Other Ambulatory Visit: Payer: Self-pay | Admitting: Family Medicine

## 2024-04-10 DIAGNOSIS — F325 Major depressive disorder, single episode, in full remission: Secondary | ICD-10-CM

## 2024-05-27 ENCOUNTER — Other Ambulatory Visit: Payer: Self-pay | Admitting: Family Medicine

## 2024-05-27 DIAGNOSIS — G8929 Other chronic pain: Secondary | ICD-10-CM

## 2024-05-28 ENCOUNTER — Ambulatory Visit: Payer: BC Managed Care – PPO | Admitting: Dermatology

## 2024-05-28 ENCOUNTER — Encounter: Payer: Self-pay | Admitting: Dermatology

## 2024-05-28 DIAGNOSIS — W908XXA Exposure to other nonionizing radiation, initial encounter: Secondary | ICD-10-CM | POA: Diagnosis not present

## 2024-05-28 DIAGNOSIS — D492 Neoplasm of unspecified behavior of bone, soft tissue, and skin: Secondary | ICD-10-CM | POA: Diagnosis not present

## 2024-05-28 DIAGNOSIS — L570 Actinic keratosis: Secondary | ICD-10-CM

## 2024-05-28 DIAGNOSIS — Z85828 Personal history of other malignant neoplasm of skin: Secondary | ICD-10-CM

## 2024-05-28 DIAGNOSIS — L820 Inflamed seborrheic keratosis: Secondary | ICD-10-CM

## 2024-05-28 DIAGNOSIS — L578 Other skin changes due to chronic exposure to nonionizing radiation: Secondary | ICD-10-CM | POA: Diagnosis not present

## 2024-05-28 DIAGNOSIS — D2272 Melanocytic nevi of left lower limb, including hip: Secondary | ICD-10-CM

## 2024-05-28 DIAGNOSIS — Q825 Congenital non-neoplastic nevus: Secondary | ICD-10-CM

## 2024-05-28 DIAGNOSIS — D229 Melanocytic nevi, unspecified: Secondary | ICD-10-CM

## 2024-05-28 DIAGNOSIS — Z1283 Encounter for screening for malignant neoplasm of skin: Secondary | ICD-10-CM

## 2024-05-28 DIAGNOSIS — L821 Other seborrheic keratosis: Secondary | ICD-10-CM

## 2024-05-28 DIAGNOSIS — D1801 Hemangioma of skin and subcutaneous tissue: Secondary | ICD-10-CM

## 2024-05-28 DIAGNOSIS — L814 Other melanin hyperpigmentation: Secondary | ICD-10-CM

## 2024-05-28 DIAGNOSIS — D239 Other benign neoplasm of skin, unspecified: Secondary | ICD-10-CM

## 2024-05-28 HISTORY — DX: Other benign neoplasm of skin, unspecified: D23.9

## 2024-05-28 NOTE — Progress Notes (Signed)
 Follow-Up Visit   Subjective  Amanda Day is a 58 y.o. female who presents for the following: Skin Cancer Screening and Full Body Skin Exam. Hx of BCC. Hx of AKs.    The patient presents for Total-Body Skin Exam (TBSE) for skin cancer screening and mole check. The patient has spots, moles and lesions to be evaluated, some may be new or changing and the patient may have concern these could be cancer.   The following portions of the chart were reviewed this encounter and updated as appropriate: medications, allergies, medical history  Review of Systems:  No other skin or systemic complaints except as noted in HPI or Assessment and Plan.  Objective  Well appearing patient in no apparent distress; mood and affect are within normal limits.  A full examination was performed including scalp, head, eyes, ears, nose, lips, neck, chest, axillae, abdomen, back, buttocks, bilateral upper extremities, bilateral lower extremities, hands, feet, fingers, toes, fingernails, and toenails. All findings within normal limits unless otherwise noted below.   Relevant physical exam findings are noted in the Assessment and Plan.  Exam of nails limited by presence of nail polish.  Left Hip 1 cm x 0.7 cm irregular pigmented patch   Right Chest x1 Erythematous keratotic or waxy stuck-on papule or plaque.   Assessment & Plan   SKIN CANCER SCREENING PERFORMED TODAY.  Congenital vascular lesion right posterior thigh Exam: red vascular patch   Treatment Plan: Benign. Observe   ACTINIC DAMAGE - Chronic condition, secondary to cumulative UV/sun exposure - diffuse scaly erythematous macules with underlying dyspigmentation - Recommend daily broad spectrum sunscreen SPF 30+ to sun-exposed areas, reapply every 2 hours as needed.  - Staying in the shade or wearing long sleeves, sun glasses (UVA+UVB protection) and wide brim hats (4-inch brim around the entire circumference of the hat) are also  recommended for sun protection.  - Call for new or changing lesions.  LENTIGINES, SEBORRHEIC KERATOSES, HEMANGIOMAS - Benign normal skin lesions - Benign-appearing - Call for any changes  MELANOCYTIC NEVI - Tan-brown and/or pink-flesh-colored symmetric macules and papules - Benign appearing on exam today - Observation - Call clinic for new or changing moles - Recommend daily use of broad spectrum spf 30+ sunscreen to sun-exposed areas.  Check nails when remove polish.   HISTORY OF BASAL CELL CARCINOMA OF THE SKIN Left anterior neck 04/2020 - No evidence of recurrence today - Recommend regular full body skin exams - Recommend daily broad spectrum sunscreen SPF 30+ to sun-exposed areas, reapply every 2 hours as needed.  - Call if any new or changing lesions are noted between office visits  Actinic keratosis left forehead, right forehead, central chest   Patient deferred treatment today.    Will continue to monitor , recheck at next follow up, patient advised to call if notices any changes.     Actinic keratoses are precancerous spots that appear secondary to cumulative UV radiation exposure/sun exposure over time. They are chronic with expected duration over 1 year. A portion of actinic keratoses will progress to squamous cell carcinoma of the skin. It is not possible to reliably predict which spots will progress to skin cancer and so treatment is recommended to prevent development of skin cancer.   Recommend daily broad spectrum sunscreen SPF 30+ to sun-exposed areas, reapply every 2 hours as needed.  Recommend staying in the shade or wearing long sleeves, sun glasses (UVA+UVB protection) and wide brim hats (4-inch brim around the entire circumference of the  hat). Call for new or changing lesions. NEOPLASM OF SKIN Left Hip Skin / nail biopsy Type of biopsy: tangential   Informed consent: discussed and consent obtained   Timeout: patient name, date of birth, surgical site, and  procedure verified   Procedure prep:  Patient was prepped and draped in usual sterile fashion Prep type:  Isopropyl alcohol Anesthesia: the lesion was anesthetized in a standard fashion   Anesthetic:  1% lidocaine  w/ epinephrine  1-100,000 buffered w/ 8.4% NaHCO3 Instrument used: DermaBlade   Hemostasis achieved with: pressure and aluminum chloride   Outcome: patient tolerated procedure well   Post-procedure details: sterile dressing applied and wound care instructions given   Dressing type: bandage and petrolatum    Specimen 1 - Surgical pathology Differential Diagnosis: Nevus, R/O dysplastic nevus vs melanoma  Check Margins: No INFLAMED SEBORRHEIC KERATOSIS Right Chest x1 Symptomatic, irritating, patient would like treated.  Discussed biopsy vs LN2. Patient prefers LN2 treatment today. If not resolved in 6-8 weeks RTC for biopsy.  Destruction of lesion - Right Chest x1 Complexity: simple   Destruction method: cryotherapy   Informed consent: discussed and consent obtained   Timeout:  patient name, date of birth, surgical site, and procedure verified Lesion destroyed using liquid nitrogen: Yes   Region frozen until ice ball extended beyond lesion: Yes   Cryo cycles: 1 or 2. Outcome: patient tolerated procedure well with no complications   Post-procedure details: wound care instructions given   Additional details:  Prior to procedure, discussed risks of blister formation, small wound, skin dyspigmentation, or rare scar following cryotherapy. Recommend Vaseline ointment to treated areas while healing.   MULTIPLE BENIGN NEVI   LENTIGINES   SEBORRHEIC KERATOSES   ACTINIC ELASTOSIS   CHERRY ANGIOMA   ACTINIC KERATOSES    Return in about 1 year (around 05/28/2025) for TBSE, HxBCC.  I, Jill Parcell, CMA, am acting as scribe for Boneta Sharps, MD.    Documentation: I have reviewed the above documentation for accuracy and completeness, and I agree with the  above.  Boneta Sharps, MD

## 2024-05-28 NOTE — Patient Instructions (Addendum)
 Cryotherapy Aftercare  Wash gently with soap and water everyday.   Apply Vaseline Jelly daily until healed.    Wound Care Instructions  Cleanse wound gently with soap and water once a day then pat dry with clean gauze. Apply a thin coat of Petrolatum (petroleum jelly, Vaseline) over the wound (unless you have an allergy to this). We recommend that you use a new, sterile tube of Vaseline. Do not pick or remove scabs. Do not remove the yellow or white healing tissue from the base of the wound.  Cover the wound with fresh, clean, nonstick gauze and secure with paper tape. You may use Band-Aids in place of gauze and tape if the wound is small enough, but would recommend trimming much of the tape off as there is often too much. Sometimes Band-Aids can irritate the skin.  You should call the office for your biopsy report after 1 week if you have not already been contacted.  If you experience any problems, such as abnormal amounts of bleeding, swelling, significant bruising, significant pain, or evidence of infection, please call the office immediately.  FOR ADULT SURGERY PATIENTS: If you need something for pain relief you may take 1 extra strength Tylenol  (acetaminophen ) AND 2 Ibuprofen (200mg  each) together every 4 hours as needed for pain. (do not take these if you are allergic to them or if you have a reason you should not take them.) Typically, you may only need pain medication for 1 to 3 days.      Recommend daily broad spectrum sunscreen SPF 30+ to sun-exposed areas, reapply every 2 hours as needed. Call for new or changing lesions.  Staying in the shade or wearing long sleeves, sun glasses (UVA+UVB protection) and wide brim hats (4-inch brim around the entire circumference of the hat) are also recommended for sun protection.      Melanoma ABCDEs  Melanoma is the most dangerous type of skin cancer, and is the leading cause of death from skin disease.  You are more likely to develop  melanoma if you: Have light-colored skin, light-colored eyes, or red or blond hair Spend a lot of time in the sun Tan regularly, either outdoors or in a tanning bed Have had blistering sunburns, especially during childhood Have a close family member who has had a melanoma Have atypical moles or large birthmarks  Early detection of melanoma is key since treatment is typically straightforward and cure rates are extremely high if we catch it early.   The first sign of melanoma is often a change in a mole or a new dark spot.  The ABCDE system is a way of remembering the signs of melanoma.  A for asymmetry:  The two halves do not match. B for border:  The edges of the growth are irregular. C for color:  A mixture of colors are present instead of an even brown color. D for diameter:  Melanomas are usually (but not always) greater than 6mm - the size of a pencil eraser. E for evolution:  The spot keeps changing in size, shape, and color.  Please check your skin once per month between visits. You can use a small mirror in front and a large mirror behind you to keep an eye on the back side or your body.   If you see any new or changing lesions before your next follow-up, please call to schedule a visit.  Please continue daily skin protection including broad spectrum sunscreen SPF 30+ to sun-exposed areas, reapplying every  2 hours as needed when you're outdoors.   Staying in the shade or wearing long sleeves, sun glasses (UVA+UVB protection) and wide brim hats (4-inch brim around the entire circumference of the hat) are also recommended for sun protection.      Due to recent changes in healthcare laws, you may see results of your pathology and/or laboratory studies on MyChart before the doctors have had a chance to review them. We understand that in some cases there may be results that are confusing or concerning to you. Please understand that not all results are received at the same time and often  the doctors may need to interpret multiple results in order to provide you with the best plan of care or course of treatment. Therefore, we ask that you please give us  2 business days to thoroughly review all your results before contacting the office for clarification. Should we see a critical lab result, you will be contacted sooner.   If You Need Anything After Your Visit  If you have any questions or concerns for your doctor, please call our main line at [phone] and press option 4 to reach your doctor's medical assistant. If no one answers, please leave a voicemail as directed and we will return your call as soon as possible. Messages left after 4 pm will be answered the following business day.   You may also send us  a message via MyChart. We typically respond to MyChart messages within 1-2 business days.  For prescription refills, please ask your pharmacy to contact our office. Our fax number is [phone].  If you have an urgent issue when the clinic is closed that cannot wait until the next business day, you can page your doctor at the number below.    Please note that while we do our best to be available for urgent issues outside of office hours, we are not available 24/7.   If you have an urgent issue and are unable to reach us , you may choose to seek medical care at your doctor's office, retail clinic, urgent care center, or emergency room.  If you have a medical emergency, please immediately call 911 or go to the emergency department.  Pager Numbers  - Dr. Hester: [phone]  - Dr. Jackquline: [phone]  - Dr. Claudene: [phone]   - Dr. Raymund: [phone]  In the event of inclement weather, please call our main line at [phone] for an update on the status of any delays or closures.  Dermatology Medication Tips: Please keep the boxes that topical medications come in in order to help keep track of the instructions about where and how to use these. Pharmacies  typically print the medication instructions only on the boxes and not directly on the medication tubes.   If your medication is too expensive, please contact our office at [phone] option 4 or send us  a message through MyChart.   We are unable to tell what your co-pay for medications will be in advance as this is different depending on your insurance coverage. However, we may be able to find a substitute medication at lower cost or fill out paperwork to get insurance to cover a needed medication.   If a prior authorization is required to get your medication covered by your insurance company, please allow us  1-2 business days to complete this process.  Drug prices often vary depending on where the prescription is filled and some pharmacies may offer cheaper prices.  The website www.goodrx.com contains coupons for medications through  different pharmacies. The prices here do not account for what the cost may be with help from insurance (it may be cheaper with your insurance), but the website can give you the price if you did not use any insurance.  - You can print the associated coupon and take it with your prescription to the pharmacy.  - You may also stop by our office during regular business hours and pick up a GoodRx coupon card.  - If you need your prescription sent electronically to a different pharmacy, notify our office through [facility] or by phone at [phone] option 4.     Si Usted Necesita Algo Despus de Su Visita  Tambin puede enviarnos un mensaje a travs de Clinical cytogeneticist. Por lo general respondemos a los mensajes de MyChart en el transcurso de 1 a 2 das hbiles.  Para renovar recetas, por favor pida a su farmacia que se ponga en contacto con nuestra oficina. Randi lakes de fax es [place] [phone].  Si tiene un asunto urgente cuando la clnica est cerrada y que no puede esperar hasta el siguiente da hbil, puede llamar/localizar a su doctor(a) al nmero que  aparece a continuacin.   Por favor, tenga en cuenta que aunque hacemos todo lo posible para estar disponibles para asuntos urgentes fuera del horario de [place], no estamos disponibles las 24 horas del da, los 7 [address]  [address] de la [place].   Si tiene un problema urgente y no puede comunicarse con nosotros, puede optar por buscar atencin mdica  en el consultorio de su doctor(a), en una clnica privada, en un centro de atencin urgente o en una sala de emergencias.  Si tiene Engineer, drilling, por favor llame inmediatamente al 911 o vaya a la sala de emergencias.  Nmeros de bper  - Dr. Hester: [phone]  - Dra. Jackquline: [phone]  - Dr. Claudene: [phone]  - Dra. Kitts: [phone]  En caso de inclemencias del [place], por favor llame a nuestra lnea principal al [phone] para una actualizacin sobre el estado de cualquier retraso o cierre.  Consejos para la medicacin en dermatologa: Por favor, guarde las cajas en las que vienen los medicamentos de uso tpico para ayudarle a seguir las instrucciones sobre dnde y cmo usarlos. Las farmacias generalmente imprimen las instrucciones del medicamento slo en las cajas y no directamente en los tubos del [place].   Si su medicamento es muy caro, por favor, pngase en contacto con landry rieger llamando al [phone] y presione la opcin 4 o envenos un mensaje a travs de Clinical cytogeneticist.   No podemos decirle cul ser su copago por los medicamentos por adelantado ya que esto es diferente dependiendo de la cobertura de su seguro. Sin embargo, es posible que podamos encontrar un medicamento sustituto a Audiological scientist un formulario para que el seguro cubra el medicamento que se considera necesario.   Si se requiere una autorizacin previa para que su compaa de seguros malta su medicamento, por favor permtanos de 1 a 2 das hbiles para completar este proceso.  Los precios de los medicamentos varan con frecuencia  dependiendo del Environmental consultant de dnde se surte la receta y alguna farmacias pueden ofrecer precios ms baratos.  El sitio web www.goodrx.com tiene cupones para medicamentos de Health and safety inspector. Los precios aqu no tienen en cuenta lo que podra costar con la ayuda del seguro (puede ser ms barato con su seguro), pero el sitio web puede darle el precio si no utiliz Tourist information centre manager.  - Puede imprimir el  cupn correspondiente y llevarlo con su receta a la farmacia.  - Tambin puede pasar por nuestra oficina durante el horario de atencin regular y Education officer, museum una tarjeta de cupones de GoodRx.  - Si necesita que su receta se enve electrnicamente a una farmacia diferente, informe a nuestra oficina a travs de MyChart de [place] o por telfono llamando al [phone] y presione la opcin 4.

## 2024-05-29 ENCOUNTER — Ambulatory Visit: Payer: Self-pay | Admitting: Dermatology

## 2024-05-29 LAB — SURGICAL PATHOLOGY

## 2024-07-06 ENCOUNTER — Other Ambulatory Visit: Payer: Self-pay | Admitting: Family Medicine

## 2024-07-06 DIAGNOSIS — F325 Major depressive disorder, single episode, in full remission: Secondary | ICD-10-CM

## 2024-07-14 ENCOUNTER — Other Ambulatory Visit: Payer: Self-pay | Admitting: Family Medicine

## 2024-07-14 DIAGNOSIS — J301 Allergic rhinitis due to pollen: Secondary | ICD-10-CM

## 2024-08-28 ENCOUNTER — Ambulatory Visit (INDEPENDENT_AMBULATORY_CARE_PROVIDER_SITE_OTHER): Admitting: Family Medicine

## 2024-08-28 ENCOUNTER — Other Ambulatory Visit (HOSPITAL_COMMUNITY)
Admission: RE | Admit: 2024-08-28 | Discharge: 2024-08-28 | Disposition: A | Source: Ambulatory Visit | Attending: Family Medicine | Admitting: Family Medicine

## 2024-08-28 VITALS — BP 133/73 | HR 60 | Resp 16 | Ht 68.0 in | Wt 217.5 lb

## 2024-08-28 DIAGNOSIS — Z0000 Encounter for general adult medical examination without abnormal findings: Secondary | ICD-10-CM

## 2024-08-28 DIAGNOSIS — Z01411 Encounter for gynecological examination (general) (routine) with abnormal findings: Secondary | ICD-10-CM | POA: Diagnosis not present

## 2024-08-28 DIAGNOSIS — Z131 Encounter for screening for diabetes mellitus: Secondary | ICD-10-CM

## 2024-08-28 DIAGNOSIS — B977 Papillomavirus as the cause of diseases classified elsewhere: Secondary | ICD-10-CM

## 2024-08-28 DIAGNOSIS — Z23 Encounter for immunization: Secondary | ICD-10-CM | POA: Diagnosis not present

## 2024-08-28 DIAGNOSIS — E538 Deficiency of other specified B group vitamins: Secondary | ICD-10-CM | POA: Diagnosis not present

## 2024-08-28 DIAGNOSIS — Z124 Encounter for screening for malignant neoplasm of cervix: Secondary | ICD-10-CM | POA: Insufficient documentation

## 2024-08-28 DIAGNOSIS — Z130 Encounter for screening for diseases of the blood and blood-forming organs and certain disorders involving the immune mechanism: Secondary | ICD-10-CM

## 2024-08-28 DIAGNOSIS — E559 Vitamin D deficiency, unspecified: Secondary | ICD-10-CM

## 2024-08-28 DIAGNOSIS — I10 Essential (primary) hypertension: Secondary | ICD-10-CM | POA: Diagnosis not present

## 2024-08-28 DIAGNOSIS — Z6834 Body mass index (BMI) 34.0-34.9, adult: Secondary | ICD-10-CM | POA: Diagnosis not present

## 2024-08-28 DIAGNOSIS — Z01419 Encounter for gynecological examination (general) (routine) without abnormal findings: Secondary | ICD-10-CM | POA: Insufficient documentation

## 2024-08-28 DIAGNOSIS — Z13220 Encounter for screening for lipoid disorders: Secondary | ICD-10-CM

## 2024-08-28 NOTE — Assessment & Plan Note (Addendum)
 Recommend continued exercise regimen and well balanced diet  Ordered CBC, CMP, A1c, Vitamin D  and vitamin B12 levels  Mammgogram due in Mar 2026 Pap smear with HPV testing collected today Pneumoccocal vaccine administered today

## 2024-08-28 NOTE — Progress Notes (Signed)
 "  Complete physical exam  Patient: Amanda Day    DOB: [birth year] 59 y.o.   MRN: [id number]  Chief Complaint  Patient presents with   Annual Exam    Patient here for physical exam. No concerns. Patient is exercising.Doing water  aerobics. Immunizations: Prevnar20 today.    Subjective:    Amanda Day is a 59 y.o. female who presents today for a complete physical exam and cervical cancer screening. She does not have additional problems to discuss today.   Discussed the use of AI scribe software for clinical note transcription with the patient, who gave verbal consent to proceed.  History of Present Illness Amanda Day is a 59 year old female who presents for an annual physical exam and cervical cancer screening.  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 She has no additional concerns at this time.  She experiences chronic knee pain and has previously received gel injections. The initial post-injection period was painful with significant pressure, and relief lasted only about three days. She continues to experience knee pain but finds that deep water  aerobics and using an exercise bike help manage the pain.  She maintains a healthy diet, focusing on fresh vegetables and reducing cheese intake to manage cholesterol levels. She eats vegetarian meals a few nights a week and avoids processed foods. She exercises regularly, typically for thirty minutes before work and participates in deep water  aerobics twice a week.  She recently experienced a bad cold with sinus pressure but no fever, which resolved without medication in the past two to three days. Her family members also had similar symptoms and were able to take Sudafed for relief.  Her family history is significant for her mother being diagnosed with stage three chronic kidney disease, which briefly progressed to stage four before returning to stage  three. She has not experienced kidney infections or urinary tract infections in many years but is mindful of her family history.  She takes a daily B12 supplement of 1000 mcg and has recently added a probiotic to her regimen. Mammogram due in March 2026  Recommended for Hep B vaccine, COVID, flu and pneumococcal vaccine   Most recent fall risk assessment:    08/28/2024    8:52 AM  Fall Risk   Falls in the past year? 0  Number falls in past yr: 0  Injury with Fall? 0  Risk for fall due to : No Fall Risks  Follow up Falls evaluation completed     Most recent depression screenings:    08/28/2024    8:54 AM 08/28/2024    8:52 AM  PHQ 2/9 Scores  PHQ - 2 Score 0 0  PHQ- 9 Score 0     Patient Care Team: Sharma Coyer, MD as PCP - General (Family Medicine)   ROS    Objective:    BP 133/73 (BP Location: Right Arm, Patient Position: Sitting, Cuff Size: Normal)   Pulse 60   Resp 16   Ht 5' 8 (1.727 m)   Wt 217 lb 8 oz (98.7 kg)   LMP 05/23/2018   SpO2 100%   BMI 33.07 kg/m  BP Readings from Last 3 Encounters:  08/28/24 133/73  02/23/24 130/82  08/11/23 133/80   Wt Readings from Last 3 Encounters:  08/28/24 217 lb 8 oz (98.7 kg)  02/23/24 225 lb (102.1 kg)  08/11/23 231 lb 8 oz (105 kg)  Physical Exam Vitals reviewed. Exam conducted with a chaperone present.  Constitutional:      General: She is not in acute distress.    Appearance: Normal appearance. She is not ill-appearing, toxic-appearing or diaphoretic.  HENT:     Head: Normocephalic and atraumatic.     Right Ear: Tympanic membrane and external ear normal. There is no impacted cerumen.     Left Ear: Tympanic membrane and external ear normal. There is no impacted cerumen.     Nose: Nose normal.     Mouth/Throat:     Pharynx: Oropharynx is clear.  Eyes:     General: No scleral icterus.    Extraocular Movements: Extraocular movements intact.     Conjunctiva/sclera: Conjunctivae normal.      Pupils: Pupils are equal, round, and reactive to light.  Cardiovascular:     Rate and Rhythm: Normal rate and regular rhythm.     Pulses: Normal pulses.     Heart sounds: Normal heart sounds. No murmur heard.    No friction rub. No gallop.  Pulmonary:     Effort: Pulmonary effort is normal. No respiratory distress.     Breath sounds: Normal breath sounds. No wheezing, rhonchi or rales.  Abdominal:     General: Bowel sounds are normal. There is no distension.     Palpations: Abdomen is soft. There is no mass.     Tenderness: There is no abdominal tenderness. There is no guarding.  Genitourinary:    General: Normal vulva.     Pubic Area: No rash or pubic lice.      Labia:        Right: No rash, tenderness, lesion or injury.        Left: No rash, tenderness, lesion or injury.      Urethra: No urethral pain, urethral swelling or urethral lesion.     Vagina: No vaginal discharge, erythema, tenderness, bleeding or lesions.     Cervix: Normal.     Uterus: Normal.      Adnexa: Right adnexa normal and left adnexa normal.     Rectum: Normal.  Musculoskeletal:        General: No deformity.     Cervical back: Normal range of motion and neck supple.     Right lower leg: No edema.     Left lower leg: No edema.  Lymphadenopathy:     Cervical: No cervical adenopathy.  Skin:    General: Skin is warm.     Capillary Refill: Capillary refill takes less than 2 seconds.     Findings: No erythema or rash.  Neurological:     General: No focal deficit present.     Mental Status: She is alert and oriented to person, place, and time.     Cranial Nerves: Cranial nerves 2-12 are intact. No cranial nerve deficit or facial asymmetry.     Motor: Motor function is intact. No weakness.     Gait: Gait normal.  Psychiatric:        Mood and Affect: Mood normal.        Behavior: Behavior normal.       No results found for any visits on 08/28/24. Last CBC Lab Results  Component Value Date   WBC 4.7  08/11/2023   HGB 12.9 08/11/2023   HCT 39.0 08/11/2023   MCV 93 08/11/2023   MCH 30.7 08/11/2023   RDW 12.6 08/11/2023   PLT 263 08/11/2023   Last metabolic panel Lab Results  Component Value  Date   GLUCOSE 86 08/11/2023   NA 143 08/11/2023   K 4.3 08/11/2023   CL 105 08/11/2023   CO2 24 08/11/2023   BUN 23 08/11/2023   CREATININE 0.91 08/11/2023   EGFR 73 08/11/2023   CALCIUM 9.4 08/11/2023   PROT 6.5 08/11/2023   ALBUMIN 4.2 08/11/2023   LABGLOB 2.3 08/11/2023   AGRATIO 1.9 08/05/2022   BILITOT 0.4 08/11/2023   ALKPHOS 70 08/11/2023   AST 23 08/11/2023   ALT 32 08/11/2023   Last lipids Lab Results  Component Value Date   CHOL 273 (H) 02/23/2024   HDL 48 02/23/2024   LDLCALC 182 (H) 02/23/2024   TRIG 228 (H) 02/23/2024   CHOLHDL 5.7 (H) 02/23/2024   Last hemoglobin A1c Lab Results  Component Value Date   HGBA1C 5.6 08/11/2023   Last thyroid  functions Lab Results  Component Value Date   TSH 2.220 08/11/2023   FREET4 1.20 08/11/2023   Last vitamin D  Lab Results  Component Value Date   VD25OH 45.2 08/11/2023   Last vitamin B12 and Folate Lab Results  Component Value Date   VITAMINB12 >2000 (H) 02/23/2024         Assessment & Plan:    Routine Health Maintenance and Physical Exam Immunization History  Administered Date(s) Administered   Influenza, Mdck, Trivalent,PF 6+ MOS(egg free) 05/31/2024   Influenza,inj,Quad PF,6+ Mos 05/10/2013, 05/24/2015, 07/28/2017, 06/07/2018, 06/19/2019   Influenza-Unspecified 06/16/2020, 06/10/2021, 05/25/2022, 05/18/2023   Moderna Covid-19 Fall Seasonal Vaccine 65yrs & older 05/25/2022, 05/18/2023, 05/31/2024   PFIZER(Purple Top)SARS-COV-2 Vaccination 11/16/2019, 12/14/2019, 06/16/2020   Pfizer Covid-19 Vaccine Bivalent Booster 66yrs & up 06/10/2021   Tdap 03/15/2011, 02/23/2024   Zoster Recombinant(Shingrix ) 06/19/2019, 10/15/2019    Health Maintenance  Topic Date Due   Hepatitis B Vaccines 19-59 Average Risk  (1 of 3 - 19+ 3-dose series) Never done   Pneumococcal Vaccine: 50+ Years (1 of 1 - PCV) Never done   Mammogram  10/25/2024   COVID-19 Vaccine (8 - Pfizer risk 2025-26 season) 11/29/2024   Colonoscopy  02/20/2025   Cervical Cancer Screening (HPV/Pap Cotest)  08/03/2026   DTaP/Tdap/Td (3 - Td or Tdap) 02/22/2034   Influenza Vaccine  Completed   Hepatitis C Screening  Completed   HIV Screening  Completed   Zoster Vaccines- Shingrix   Completed   HPV VACCINES  Aged Out   Meningococcal B Vaccine  Aged Out    Discussed health benefits of physical activity, and encouraged her to engage in regular exercise appropriate for her age and condition.  Problem List Items Addressed This Visit     Annual physical exam - Primary   Recommend continued exercise regimen and well balanced diet  Ordered CBC, CMP, A1c, Vitamin D  and vitamin B12 levels  Mammgogram due in Mar 2026 Pap smear with HPV testing collected today Pneumoccocal vaccine administered today       Avitaminosis D   Relevant Orders   VITAMIN D  25 Hydroxy (Vit-D Deficiency, Fractures)   BMI 34.0-34.9,adult   Essential (primary) hypertension   Relevant Orders   CMP14+EGFR   High risk HPV infection   Relevant Orders   Cytology - PAP   Vitamin B12 deficiency   Relevant Orders   Vitamin B12   Well woman exam with routine gynecological exam   Other Visit Diagnoses       Screening for cervical cancer       Relevant Orders   Cytology - PAP     Screening for deficiency anemia  Relevant Orders   CBC     Screening for diabetes mellitus       Relevant Orders   Hemoglobin A1c     Screening for lipid disorders       Relevant Orders   Lipid panel     Immunization due       Relevant Orders   Pneumococcal conjugate vaccine 20-valent (Prevnar 20)       Assessment and Plan Assessment & Plan Woman's Wellness Visit Annual physical examination with no additional concerns. Discussed diet and exercise habits, including efforts  to reduce cheese intake and increase fresh vegetables and vegetarian meals. Engages in regular exercise, including deep water  aerobics and exercise bike, to manage knee pain. Family history of chronic kidney disease noted, with awareness of potential hereditary risk. - Ordered A1c, CBC, CMP, lipid panel, vitamin B12, and vitamin D  tests - Performed Pap smear with high risk HPV testing - Administered flu shot - Administered pneumococcal vaccine - Scheduled follow-up in 6 months  Essential hypertension Chronic  Blood pressure is well-controlled. - Scheduled follow-up in 6 months to monitor blood pressure  Osteoarthritis of knee Chronic knee pain managed with NSAIDs and exercise. Previous gel injections provided temporary relief. Considering returning to physical therapy for further management. - Continue NSAIDs for knee pain - Encouraged continuation of exercise regimen - Recommended follow-up with physical therapy  Vitamin B12 deficiency Chronic  Currently taking vitamin B12 supplement (1000 mcg daily). - Ordered vitamin B12 level test  Vitamin D  deficiency Chronic  Currently taking vitamin D  supplement. - Ordered vitamin D  level test  High risk HPV infection, history of History of high risk HPV infection. No current symptoms or concerns. - Performed Pap smear with high risk HPV testing    Return in about 6 months (around 02/25/2025) for HTN,knee arthritis .    Rockie Agent, MD [facility]    "

## 2024-08-28 NOTE — Patient Instructions (Signed)
 To keep you healthy, please keep in mind the following health maintenance items that you are due for:   Health Maintenance Due  Topic Date Due   Hepatitis B Vaccines 19-59 Average Risk (1 of 3 - 19+ 3-dose series) Never done   Pneumococcal Vaccine: 50+ Years (1 of 1 - PCV) Never done   Influenza Vaccine  03/23/2024   COVID-19 Vaccine (8 - 2025-26 season) 04/23/2024   Mammogram  10/25/2024     Best Wishes,   Dr. Lang

## 2024-08-29 ENCOUNTER — Ambulatory Visit: Payer: Self-pay | Admitting: Family Medicine

## 2024-08-29 LAB — CMP14+EGFR
ALT: 25 IU/L (ref 0–32)
AST: 25 IU/L (ref 0–40)
Albumin: 4.5 g/dL (ref 3.8–4.9)
Alkaline Phosphatase: 70 IU/L (ref 49–135)
BUN/Creatinine Ratio: 21 (ref 9–23)
BUN: 20 mg/dL (ref 6–24)
Bilirubin Total: 0.5 mg/dL (ref 0.0–1.2)
CO2: 25 mmol/L (ref 20–29)
Calcium: 10 mg/dL (ref 8.7–10.2)
Chloride: 102 mmol/L (ref 96–106)
Creatinine, Ser: 0.96 mg/dL (ref 0.57–1.00)
Globulin, Total: 1.9 g/dL (ref 1.5–4.5)
Glucose: 90 mg/dL (ref 70–99)
Potassium: 4.7 mmol/L (ref 3.5–5.2)
Sodium: 140 mmol/L (ref 134–144)
Total Protein: 6.4 g/dL (ref 6.0–8.5)
eGFR: 68 mL/min/1.73

## 2024-08-29 LAB — VITAMIN B12: Vitamin B-12: >2000 pg/mL — ABNORMAL HIGH (ref 232–1245)

## 2024-08-29 LAB — CBC
Hematocrit: 39.6 % (ref 34.0–46.6)
Hemoglobin: 13.1 g/dL (ref 11.1–15.9)
MCH: 31 pg (ref 26.6–33.0)
MCHC: 33.1 g/dL (ref 31.5–35.7)
MCV: 94 fL (ref 79–97)
Platelets: 285 x10E3/uL (ref 150–450)
RBC: 4.22 x10E6/uL (ref 3.77–5.28)
RDW: 12.3 % (ref 11.7–15.4)
WBC: 4.8 x10E3/uL (ref 3.4–10.8)

## 2024-08-29 LAB — LIPID PANEL
Chol/HDL Ratio: 5.9 ratio — ABNORMAL HIGH (ref 0.0–4.4)
Cholesterol, Total: 275 mg/dL — ABNORMAL HIGH (ref 100–199)
HDL: 47 mg/dL
LDL Chol Calc (NIH): 190 mg/dL — ABNORMAL HIGH (ref 0–99)
Triglycerides: 201 mg/dL — ABNORMAL HIGH (ref 0–149)
VLDL Cholesterol Cal: 38 mg/dL (ref 5–40)

## 2024-08-29 LAB — HEMOGLOBIN A1C
Est. average glucose Bld gHb Est-mCnc: 108 mg/dL
Hgb A1c MFr Bld: 5.4 % (ref 4.8–5.6)

## 2024-08-29 LAB — VITAMIN D 25 HYDROXY (VIT D DEFICIENCY, FRACTURES): Vit D, 25-Hydroxy: 55.6 ng/mL (ref 30.0–100.0)

## 2024-08-30 ENCOUNTER — Other Ambulatory Visit: Payer: Self-pay | Admitting: Family Medicine

## 2024-08-30 DIAGNOSIS — I10 Essential (primary) hypertension: Secondary | ICD-10-CM

## 2024-08-31 LAB — CYTOLOGY - PAP
Comment: NEGATIVE
Diagnosis: UNDETERMINED — AB
High risk HPV: NEGATIVE

## 2025-03-04 ENCOUNTER — Ambulatory Visit: Admitting: Family Medicine

## 2025-05-28 ENCOUNTER — Ambulatory Visit: Admitting: Dermatology
